# Patient Record
Sex: Male | Born: 1935 | Race: Black or African American | Hispanic: No | Marital: Married | Smoking: Former smoker
Health system: Southern US, Community
[De-identification: ages and names within clinical notes are randomized; demographics above are authoritative.]

## PROBLEM LIST (undated history)

## (undated) DIAGNOSIS — R058 Other specified cough: Secondary | ICD-10-CM

## (undated) DIAGNOSIS — D472 Monoclonal gammopathy: Secondary | ICD-10-CM

## (undated) DIAGNOSIS — H409 Unspecified glaucoma: Secondary | ICD-10-CM

## (undated) DIAGNOSIS — Z9181 History of falling: Secondary | ICD-10-CM

## (undated) DIAGNOSIS — N189 Chronic kidney disease, unspecified: Secondary | ICD-10-CM

## (undated) DIAGNOSIS — C801 Malignant (primary) neoplasm, unspecified: Secondary | ICD-10-CM

## (undated) DIAGNOSIS — K921 Melena: Secondary | ICD-10-CM

## (undated) DIAGNOSIS — D638 Anemia in other chronic diseases classified elsewhere: Secondary | ICD-10-CM

## (undated) DIAGNOSIS — E119 Type 2 diabetes mellitus without complications: Secondary | ICD-10-CM

## (undated) DIAGNOSIS — I70269 Atherosclerosis of native arteries of extremities with gangrene, unspecified extremity: Secondary | ICD-10-CM

## (undated) DIAGNOSIS — F32A Depression, unspecified: Secondary | ICD-10-CM

## (undated) DIAGNOSIS — Z89619 Acquired absence of unspecified leg above knee: Secondary | ICD-10-CM

## (undated) DIAGNOSIS — Z993 Dependence on wheelchair: Secondary | ICD-10-CM

## (undated) DIAGNOSIS — E46 Unspecified protein-calorie malnutrition: Secondary | ICD-10-CM

## (undated) DIAGNOSIS — R011 Cardiac murmur, unspecified: Secondary | ICD-10-CM

## (undated) DIAGNOSIS — I2510 Atherosclerotic heart disease of native coronary artery without angina pectoris: Secondary | ICD-10-CM

## (undated) DIAGNOSIS — I10 Essential (primary) hypertension: Secondary | ICD-10-CM

## (undated) DIAGNOSIS — G709 Myoneural disorder, unspecified: Secondary | ICD-10-CM

## (undated) DIAGNOSIS — N186 End stage renal disease: Secondary | ICD-10-CM

## (undated) DIAGNOSIS — H209 Unspecified iridocyclitis: Secondary | ICD-10-CM

## (undated) DIAGNOSIS — R05 Cough: Secondary | ICD-10-CM

## (undated) DIAGNOSIS — J189 Pneumonia, unspecified organism: Secondary | ICD-10-CM

## (undated) DIAGNOSIS — R159 Full incontinence of feces: Secondary | ICD-10-CM

## (undated) DIAGNOSIS — F329 Major depressive disorder, single episode, unspecified: Secondary | ICD-10-CM

## (undated) DIAGNOSIS — N281 Cyst of kidney, acquired: Secondary | ICD-10-CM

## (undated) DIAGNOSIS — I739 Peripheral vascular disease, unspecified: Secondary | ICD-10-CM

## (undated) DIAGNOSIS — H543 Unqualified visual loss, both eyes: Secondary | ICD-10-CM

## (undated) DIAGNOSIS — I509 Heart failure, unspecified: Secondary | ICD-10-CM

## (undated) DIAGNOSIS — K000 Anodontia: Secondary | ICD-10-CM

## (undated) DIAGNOSIS — K08109 Complete loss of teeth, unspecified cause, unspecified class: Secondary | ICD-10-CM

## (undated) HISTORY — PX: EYE SURGERY: SHX253

## (undated) HISTORY — PX: OTHER SURGICAL HISTORY: SHX169

## (undated) HISTORY — PX: PROSTATECTOMY: SHX69

## (undated) HISTORY — PX: LEG AMPUTATION ABOVE KNEE: SHX117

---

## 1989-03-30 DIAGNOSIS — Z89619 Acquired absence of unspecified leg above knee: Secondary | ICD-10-CM

## 1989-03-30 HISTORY — DX: Acquired absence of unspecified leg above knee: Z89.619

## 2006-01-26 ENCOUNTER — Ambulatory Visit: Payer: Self-pay | Admitting: Internal Medicine

## 2006-02-02 ENCOUNTER — Ambulatory Visit: Payer: Self-pay | Admitting: Internal Medicine

## 2006-02-27 ENCOUNTER — Ambulatory Visit: Payer: Self-pay | Admitting: Internal Medicine

## 2006-03-16 ENCOUNTER — Ambulatory Visit: Payer: Self-pay | Admitting: Gastroenterology

## 2006-03-30 ENCOUNTER — Ambulatory Visit: Payer: Self-pay | Admitting: Internal Medicine

## 2006-06-24 ENCOUNTER — Ambulatory Visit: Payer: Self-pay | Admitting: Internal Medicine

## 2006-06-29 ENCOUNTER — Ambulatory Visit: Payer: Self-pay | Admitting: Internal Medicine

## 2006-07-29 ENCOUNTER — Ambulatory Visit: Payer: Self-pay | Admitting: Internal Medicine

## 2006-08-29 ENCOUNTER — Ambulatory Visit: Payer: Self-pay | Admitting: Internal Medicine

## 2006-09-28 ENCOUNTER — Ambulatory Visit: Payer: Self-pay | Admitting: Internal Medicine

## 2006-10-08 ENCOUNTER — Ambulatory Visit: Payer: Self-pay | Admitting: Internal Medicine

## 2006-10-29 ENCOUNTER — Ambulatory Visit: Payer: Self-pay | Admitting: Internal Medicine

## 2006-12-29 ENCOUNTER — Ambulatory Visit: Payer: Self-pay | Admitting: Internal Medicine

## 2007-01-29 ENCOUNTER — Ambulatory Visit: Payer: Self-pay | Admitting: Internal Medicine

## 2007-02-07 ENCOUNTER — Ambulatory Visit: Payer: Self-pay | Admitting: Internal Medicine

## 2007-02-28 ENCOUNTER — Ambulatory Visit: Payer: Self-pay | Admitting: Internal Medicine

## 2007-05-01 ENCOUNTER — Ambulatory Visit: Payer: Self-pay | Admitting: Internal Medicine

## 2007-05-30 ENCOUNTER — Ambulatory Visit: Payer: Self-pay | Admitting: Internal Medicine

## 2007-06-29 ENCOUNTER — Ambulatory Visit: Payer: Self-pay | Admitting: Internal Medicine

## 2007-08-29 ENCOUNTER — Ambulatory Visit: Payer: Self-pay | Admitting: Internal Medicine

## 2007-09-28 ENCOUNTER — Ambulatory Visit: Payer: Self-pay | Admitting: Internal Medicine

## 2007-10-04 ENCOUNTER — Ambulatory Visit: Payer: Self-pay | Admitting: Internal Medicine

## 2007-10-29 ENCOUNTER — Ambulatory Visit: Payer: Self-pay | Admitting: Internal Medicine

## 2010-06-15 ENCOUNTER — Inpatient Hospital Stay: Payer: Self-pay | Admitting: Internal Medicine

## 2010-09-24 ENCOUNTER — Emergency Department: Payer: Self-pay | Admitting: Emergency Medicine

## 2010-10-02 ENCOUNTER — Ambulatory Visit: Payer: Self-pay | Admitting: Internal Medicine

## 2010-10-03 ENCOUNTER — Ambulatory Visit: Payer: Self-pay | Admitting: Internal Medicine

## 2010-10-29 ENCOUNTER — Ambulatory Visit: Payer: Self-pay | Admitting: Internal Medicine

## 2010-12-03 ENCOUNTER — Ambulatory Visit: Payer: Self-pay | Admitting: Internal Medicine

## 2010-12-29 ENCOUNTER — Ambulatory Visit: Payer: Self-pay | Admitting: Internal Medicine

## 2011-01-29 ENCOUNTER — Ambulatory Visit: Payer: Self-pay | Admitting: Internal Medicine

## 2011-02-02 ENCOUNTER — Observation Stay: Payer: Self-pay | Admitting: Internal Medicine

## 2011-02-28 ENCOUNTER — Ambulatory Visit: Payer: Self-pay | Admitting: Internal Medicine

## 2011-03-31 ENCOUNTER — Ambulatory Visit: Payer: Self-pay | Admitting: Internal Medicine

## 2011-04-22 LAB — URIC ACID: Uric Acid: 5.7 mg/dL (ref 3.5–7.2)

## 2011-04-22 LAB — CREATININE, SERUM: Creatinine: 3.57 mg/dL — ABNORMAL HIGH (ref 0.60–1.30)

## 2011-04-22 LAB — CANCER CENTER HEMOGLOBIN: HGB: 10.4 g/dL — ABNORMAL LOW (ref 13.0–18.0)

## 2011-04-22 LAB — CALCIUM: Calcium, Total: 8.6 mg/dL (ref 8.5–10.1)

## 2011-05-01 ENCOUNTER — Ambulatory Visit: Payer: Self-pay | Admitting: Internal Medicine

## 2011-05-20 LAB — CBC CANCER CENTER
Basophil %: 0 %
Eosinophil #: 0 x10 3/mm (ref 0.0–0.7)
HCT: 30.9 % — ABNORMAL LOW (ref 40.0–52.0)
Lymphocyte %: 16.1 %
MCH: 22.2 pg — ABNORMAL LOW (ref 26.0–34.0)
MCHC: 32.3 g/dL (ref 32.0–36.0)
MCV: 69 fL — ABNORMAL LOW (ref 80–100)
Neutrophil #: 4.4 x10 3/mm (ref 1.4–6.5)
RBC: 4.51 10*6/uL (ref 4.40–5.90)
WBC: 5.7 x10 3/mm (ref 3.8–10.6)

## 2011-05-20 LAB — CREATININE, SERUM: EGFR (African American): 19 — ABNORMAL LOW

## 2011-05-20 LAB — CALCIUM: Calcium, Total: 8.2 mg/dL — ABNORMAL LOW (ref 8.5–10.1)

## 2011-05-21 LAB — PROT IMMUNOELECTROPHORES(ARMC)

## 2011-05-21 LAB — KAPPA/LAMBDA FREE LIGHT CHAINS (ARMC)

## 2011-05-29 ENCOUNTER — Ambulatory Visit: Payer: Self-pay | Admitting: Internal Medicine

## 2012-07-28 ENCOUNTER — Other Ambulatory Visit: Payer: Self-pay | Admitting: Internal Medicine

## 2012-07-28 LAB — POTASSIUM: Potassium: 5.6 mmol/L — ABNORMAL HIGH (ref 3.5–5.1)

## 2012-07-28 LAB — IRON AND TIBC
Iron Saturation: 13 %
Iron: 42 ug/dL — ABNORMAL LOW (ref 65–175)

## 2012-08-10 ENCOUNTER — Ambulatory Visit: Payer: Self-pay | Admitting: Vascular Surgery

## 2012-08-10 LAB — CBC
HGB: 10 g/dL — ABNORMAL LOW (ref 13.0–18.0)
MCHC: 32 g/dL (ref 32.0–36.0)
RDW: 16.6 % — ABNORMAL HIGH (ref 11.5–14.5)
WBC: 5.2 10*3/uL (ref 3.8–10.6)

## 2012-08-10 LAB — BASIC METABOLIC PANEL
BUN: 72 mg/dL — ABNORMAL HIGH (ref 7–18)
Calcium, Total: 8.3 mg/dL — ABNORMAL LOW (ref 8.5–10.1)
Chloride: 107 mmol/L (ref 98–107)
Co2: 24 mmol/L (ref 21–32)
Creatinine: 4.64 mg/dL — ABNORMAL HIGH (ref 0.60–1.30)
EGFR (Non-African Amer.): 11 — ABNORMAL LOW
Glucose: 199 mg/dL — ABNORMAL HIGH (ref 65–99)
Osmolality: 299 (ref 275–301)

## 2012-08-17 ENCOUNTER — Ambulatory Visit: Payer: Self-pay | Admitting: Vascular Surgery

## 2012-12-27 ENCOUNTER — Emergency Department: Payer: Self-pay

## 2012-12-27 LAB — COMPREHENSIVE METABOLIC PANEL
Albumin: 3.5 g/dL (ref 3.4–5.0)
Alkaline Phosphatase: 119 U/L (ref 50–136)
Anion Gap: 9 (ref 7–16)
Bilirubin,Total: 0.3 mg/dL (ref 0.2–1.0)
Calcium, Total: 8 mg/dL — ABNORMAL LOW (ref 8.5–10.1)
Chloride: 107 mmol/L (ref 98–107)
Glucose: 237 mg/dL — ABNORMAL HIGH (ref 65–99)
Osmolality: 294 (ref 275–301)
Potassium: 5.2 mmol/L — ABNORMAL HIGH (ref 3.5–5.1)
SGPT (ALT): 23 U/L (ref 12–78)

## 2012-12-27 LAB — CBC
HCT: 33.2 % — ABNORMAL LOW (ref 40.0–52.0)
MCH: 21.1 pg — ABNORMAL LOW (ref 26.0–34.0)
MCHC: 31.7 g/dL — ABNORMAL LOW (ref 32.0–36.0)
MCV: 67 fL — ABNORMAL LOW (ref 80–100)
RDW: 18 % — ABNORMAL HIGH (ref 11.5–14.5)
WBC: 5.8 10*3/uL (ref 3.8–10.6)

## 2013-02-01 ENCOUNTER — Ambulatory Visit: Payer: Self-pay | Admitting: Vascular Surgery

## 2013-02-01 LAB — BASIC METABOLIC PANEL
Anion Gap: 4 — ABNORMAL LOW (ref 7–16)
Calcium, Total: 8.8 mg/dL (ref 8.5–10.1)
Co2: 31 mmol/L (ref 21–32)
Creatinine: 3.84 mg/dL — ABNORMAL HIGH (ref 0.60–1.30)
EGFR (African American): 17 — ABNORMAL LOW
Glucose: 188 mg/dL — ABNORMAL HIGH (ref 65–99)
Osmolality: 274 (ref 275–301)
Sodium: 134 mmol/L — ABNORMAL LOW (ref 136–145)

## 2013-02-01 LAB — CBC
HCT: 31.2 % — ABNORMAL LOW (ref 40.0–52.0)
HGB: 9.6 g/dL — ABNORMAL LOW (ref 13.0–18.0)
MCH: 21.4 pg — ABNORMAL LOW (ref 26.0–34.0)
RBC: 4.47 10*6/uL (ref 4.40–5.90)

## 2013-02-08 ENCOUNTER — Ambulatory Visit: Payer: Self-pay | Admitting: Vascular Surgery

## 2013-05-03 ENCOUNTER — Ambulatory Visit: Payer: Self-pay | Admitting: Internal Medicine

## 2013-05-08 ENCOUNTER — Ambulatory Visit: Payer: Self-pay | Admitting: Physician Assistant

## 2013-05-22 LAB — CK TOTAL AND CKMB (NOT AT ARMC)
CK, Total: 91 U/L
CK-MB: 0.7 ng/mL (ref 0.5–3.6)

## 2013-05-22 LAB — PROTIME-INR
INR: 1.2
Prothrombin Time: 15.3 secs — ABNORMAL HIGH (ref 11.5–14.7)

## 2013-05-22 LAB — COMPREHENSIVE METABOLIC PANEL
ANION GAP: 8 (ref 7–16)
AST: 57 U/L — AB (ref 15–37)
Albumin: 2.5 g/dL — ABNORMAL LOW (ref 3.4–5.0)
Alkaline Phosphatase: 110 U/L
BUN: 59 mg/dL — AB (ref 7–18)
Bilirubin,Total: 0.4 mg/dL (ref 0.2–1.0)
CHLORIDE: 99 mmol/L (ref 98–107)
CREATININE: 6.97 mg/dL — AB (ref 0.60–1.30)
Calcium, Total: 8.5 mg/dL (ref 8.5–10.1)
Co2: 29 mmol/L (ref 21–32)
EGFR (African American): 8 — ABNORMAL LOW
EGFR (Non-African Amer.): 7 — ABNORMAL LOW
GLUCOSE: 240 mg/dL — AB (ref 65–99)
OSMOLALITY: 296 (ref 275–301)
POTASSIUM: 4.8 mmol/L (ref 3.5–5.1)
SGPT (ALT): 64 U/L (ref 12–78)
SODIUM: 136 mmol/L (ref 136–145)
Total Protein: 7.7 g/dL (ref 6.4–8.2)

## 2013-05-22 LAB — URINALYSIS, COMPLETE
BILIRUBIN, UR: NEGATIVE
Blood: NEGATIVE
Ketone: NEGATIVE
Leukocyte Esterase: NEGATIVE
Nitrite: NEGATIVE
Ph: 7 (ref 4.5–8.0)
SPECIFIC GRAVITY: 1.012 (ref 1.003–1.030)

## 2013-05-22 LAB — CBC
HCT: 34.2 % — ABNORMAL LOW (ref 40.0–52.0)
HGB: 10.7 g/dL — AB (ref 13.0–18.0)
MCH: 21.2 pg — ABNORMAL LOW (ref 26.0–34.0)
MCHC: 31.3 g/dL — ABNORMAL LOW (ref 32.0–36.0)
MCV: 68 fL — AB (ref 80–100)
Platelet: 382 10*3/uL (ref 150–440)
RBC: 5.03 10*6/uL (ref 4.40–5.90)
RDW: 18.2 % — ABNORMAL HIGH (ref 11.5–14.5)
WBC: 8.5 10*3/uL (ref 3.8–10.6)

## 2013-05-22 LAB — TROPONIN I
Troponin-I: 0.07 ng/mL — ABNORMAL HIGH
Troponin-I: 0.08 ng/mL — ABNORMAL HIGH

## 2013-05-22 LAB — APTT: Activated PTT: 37.7 secs — ABNORMAL HIGH (ref 23.6–35.9)

## 2013-05-22 LAB — CK-MB: CK-MB: 1 ng/mL (ref 0.5–3.6)

## 2013-05-23 LAB — BASIC METABOLIC PANEL
Anion Gap: 9 (ref 7–16)
BUN: 64 mg/dL — ABNORMAL HIGH (ref 7–18)
CALCIUM: 8.8 mg/dL (ref 8.5–10.1)
Chloride: 98 mmol/L (ref 98–107)
Co2: 30 mmol/L (ref 21–32)
Creatinine: 7.54 mg/dL — ABNORMAL HIGH (ref 0.60–1.30)
GFR CALC AF AMER: 7 — AB
GFR CALC NON AF AMER: 6 — AB
GLUCOSE: 56 mg/dL — AB (ref 65–99)
Osmolality: 290 (ref 275–301)
POTASSIUM: 5.1 mmol/L (ref 3.5–5.1)
Sodium: 137 mmol/L (ref 136–145)

## 2013-05-23 LAB — PHOSPHORUS: PHOSPHORUS: 7.6 mg/dL — AB (ref 2.5–4.9)

## 2013-05-23 LAB — TROPONIN I: Troponin-I: 0.07 ng/mL — ABNORMAL HIGH

## 2013-05-23 LAB — CK-MB: CK-MB: 1.1 ng/mL (ref 0.5–3.6)

## 2013-05-23 LAB — HEMOGLOBIN: HGB: 9.8 g/dL — ABNORMAL LOW (ref 13.0–18.0)

## 2013-05-24 ENCOUNTER — Inpatient Hospital Stay: Payer: Self-pay | Admitting: Internal Medicine

## 2013-05-25 LAB — CLOSTRIDIUM DIFFICILE(ARMC)

## 2013-05-25 LAB — URINE CULTURE

## 2013-05-25 LAB — PHOSPHORUS: Phosphorus: 4.2 mg/dL (ref 2.5–4.9)

## 2013-05-26 LAB — PLATELET COUNT: Platelet: 331 10*3/uL (ref 150–440)

## 2013-06-01 ENCOUNTER — Emergency Department: Payer: Self-pay | Admitting: Emergency Medicine

## 2013-06-01 LAB — COMPREHENSIVE METABOLIC PANEL
ALT: 53 U/L (ref 12–78)
AST: 45 U/L — AB (ref 15–37)
Albumin: 2.5 g/dL — ABNORMAL LOW (ref 3.4–5.0)
Alkaline Phosphatase: 143 U/L — ABNORMAL HIGH
Anion Gap: 5 — ABNORMAL LOW (ref 7–16)
BILIRUBIN TOTAL: 0.3 mg/dL (ref 0.2–1.0)
BUN: 56 mg/dL — AB (ref 7–18)
CALCIUM: 8.8 mg/dL (ref 8.5–10.1)
CHLORIDE: 101 mmol/L (ref 98–107)
Co2: 29 mmol/L (ref 21–32)
Creatinine: 6.57 mg/dL — ABNORMAL HIGH (ref 0.60–1.30)
EGFR (Non-African Amer.): 7 — ABNORMAL LOW
GFR CALC AF AMER: 9 — AB
Glucose: 68 mg/dL (ref 65–99)
OSMOLALITY: 284 (ref 275–301)
Potassium: 4.6 mmol/L (ref 3.5–5.1)
Sodium: 135 mmol/L — ABNORMAL LOW (ref 136–145)
Total Protein: 8.2 g/dL (ref 6.4–8.2)

## 2013-06-01 LAB — CBC
HCT: 32.4 % — ABNORMAL LOW (ref 40.0–52.0)
HGB: 10.2 g/dL — ABNORMAL LOW (ref 13.0–18.0)
MCH: 21.3 pg — ABNORMAL LOW (ref 26.0–34.0)
MCHC: 31.5 g/dL — ABNORMAL LOW (ref 32.0–36.0)
MCV: 68 fL — AB (ref 80–100)
PLATELETS: 490 10*3/uL — AB (ref 150–440)
RBC: 4.79 10*6/uL (ref 4.40–5.90)
RDW: 19.1 % — AB (ref 11.5–14.5)
WBC: 7.1 10*3/uL (ref 3.8–10.6)

## 2013-06-02 ENCOUNTER — Ambulatory Visit: Payer: Self-pay | Admitting: Vascular Surgery

## 2013-09-27 ENCOUNTER — Ambulatory Visit: Payer: Self-pay | Admitting: Vascular Surgery

## 2013-09-27 LAB — BASIC METABOLIC PANEL
ANION GAP: 11 (ref 7–16)
BUN: 35 mg/dL — ABNORMAL HIGH (ref 7–18)
CHLORIDE: 98 mmol/L (ref 98–107)
Calcium, Total: 8.3 mg/dL — ABNORMAL LOW (ref 8.5–10.1)
Co2: 24 mmol/L (ref 21–32)
Creatinine: 5.54 mg/dL — ABNORMAL HIGH (ref 0.60–1.30)
GFR CALC AF AMER: 11 — AB
GFR CALC NON AF AMER: 9 — AB
GLUCOSE: 158 mg/dL — AB (ref 65–99)
OSMOLALITY: 278 (ref 275–301)
Potassium: 4.9 mmol/L (ref 3.5–5.1)
SODIUM: 133 mmol/L — AB (ref 136–145)

## 2013-09-27 LAB — CBC
HCT: 36.9 % — ABNORMAL LOW (ref 40.0–52.0)
HGB: 11.5 g/dL — ABNORMAL LOW (ref 13.0–18.0)
MCH: 21.8 pg — ABNORMAL LOW (ref 26.0–34.0)
MCHC: 31 g/dL — ABNORMAL LOW (ref 32.0–36.0)
MCV: 70 fL — ABNORMAL LOW (ref 80–100)
PLATELETS: 209 10*3/uL (ref 150–440)
RBC: 5.25 10*6/uL (ref 4.40–5.90)
RDW: 18 % — AB (ref 11.5–14.5)
WBC: 5.2 10*3/uL (ref 3.8–10.6)

## 2013-10-04 ENCOUNTER — Ambulatory Visit: Payer: Self-pay | Admitting: Vascular Surgery

## 2013-11-17 ENCOUNTER — Inpatient Hospital Stay: Payer: Self-pay | Admitting: Internal Medicine

## 2013-11-17 LAB — CBC WITH DIFFERENTIAL/PLATELET
BASOS ABS: 0 10*3/uL (ref 0.0–0.1)
BASOS PCT: 0.3 %
Eosinophil #: 0.1 10*3/uL (ref 0.0–0.7)
Eosinophil %: 1.2 %
HCT: 37 % — ABNORMAL LOW (ref 40.0–52.0)
HGB: 11.1 g/dL — ABNORMAL LOW (ref 13.0–18.0)
LYMPHS PCT: 4.3 %
Lymphocyte #: 0.4 10*3/uL — ABNORMAL LOW (ref 1.0–3.6)
MCH: 22.3 pg — ABNORMAL LOW (ref 26.0–34.0)
MCHC: 29.9 g/dL — ABNORMAL LOW (ref 32.0–36.0)
MCV: 75 fL — ABNORMAL LOW (ref 80–100)
MONO ABS: 0.2 x10 3/mm (ref 0.2–1.0)
Monocyte %: 1.9 %
NEUTROS ABS: 8.6 10*3/uL — AB (ref 1.4–6.5)
Neutrophil %: 92.3 %
Platelet: 266 10*3/uL (ref 150–440)
RBC: 4.96 10*6/uL (ref 4.40–5.90)
RDW: 18.2 % — ABNORMAL HIGH (ref 11.5–14.5)
WBC: 9.4 10*3/uL (ref 3.8–10.6)

## 2013-11-17 LAB — PHOSPHORUS: Phosphorus: 3.6 mg/dL (ref 2.5–4.9)

## 2013-11-17 LAB — COMPREHENSIVE METABOLIC PANEL
ALK PHOS: 90 U/L
ALT: 31 U/L
Albumin: 3 g/dL — ABNORMAL LOW (ref 3.4–5.0)
Anion Gap: 12 (ref 7–16)
BUN: 29 mg/dL — AB (ref 7–18)
Bilirubin,Total: 0.4 mg/dL (ref 0.2–1.0)
CALCIUM: 7.3 mg/dL — AB (ref 8.5–10.1)
CHLORIDE: 102 mmol/L (ref 98–107)
CO2: 26 mmol/L (ref 21–32)
Creatinine: 5.17 mg/dL — ABNORMAL HIGH (ref 0.60–1.30)
GFR CALC AF AMER: 12 — AB
GFR CALC NON AF AMER: 10 — AB
GLUCOSE: 117 mg/dL — AB (ref 65–99)
OSMOLALITY: 286 (ref 275–301)
POTASSIUM: 4.5 mmol/L (ref 3.5–5.1)
SGOT(AST): 49 U/L — ABNORMAL HIGH (ref 15–37)
Sodium: 140 mmol/L (ref 136–145)
Total Protein: 7.2 g/dL (ref 6.4–8.2)

## 2013-11-17 LAB — MAGNESIUM: MAGNESIUM: 1.3 mg/dL — AB

## 2013-11-17 LAB — PROTIME-INR
INR: 1.2
Prothrombin Time: 14.6 secs (ref 11.5–14.7)

## 2013-11-17 LAB — TROPONIN I: Troponin-I: 0.04 ng/mL

## 2013-11-18 LAB — CBC WITH DIFFERENTIAL/PLATELET
BASOS ABS: 0.1 10*3/uL (ref 0.0–0.1)
BASOS PCT: 0.5 %
EOS ABS: 0.3 10*3/uL (ref 0.0–0.7)
EOS PCT: 2.9 %
HCT: 31.6 % — AB (ref 40.0–52.0)
HGB: 9.7 g/dL — AB (ref 13.0–18.0)
Lymphocyte #: 1.1 10*3/uL (ref 1.0–3.6)
Lymphocyte %: 9.3 %
MCH: 22.9 pg — ABNORMAL LOW (ref 26.0–34.0)
MCHC: 30.7 g/dL — AB (ref 32.0–36.0)
MCV: 75 fL — ABNORMAL LOW (ref 80–100)
MONOS PCT: 8.4 %
Monocyte #: 1 x10 3/mm (ref 0.2–1.0)
NEUTROS ABS: 9 10*3/uL — AB (ref 1.4–6.5)
Neutrophil %: 78.9 %
Platelet: 235 10*3/uL (ref 150–440)
RBC: 4.23 10*6/uL — ABNORMAL LOW (ref 4.40–5.90)
RDW: 18.4 % — AB (ref 11.5–14.5)
WBC: 11.4 10*3/uL — ABNORMAL HIGH (ref 3.8–10.6)

## 2013-11-18 LAB — URINALYSIS, COMPLETE
BACTERIA: NONE SEEN
Bilirubin,UR: NEGATIVE
Glucose,UR: 150 mg/dL (ref 0–75)
KETONE: NEGATIVE
Leukocyte Esterase: NEGATIVE
Nitrite: NEGATIVE
PH: 7 (ref 4.5–8.0)
Protein: 100
Specific Gravity: 1.01 (ref 1.003–1.030)
Squamous Epithelial: 1
WBC UR: 10 /HPF (ref 0–5)

## 2013-11-18 LAB — BASIC METABOLIC PANEL
Anion Gap: 12 (ref 7–16)
BUN: 46 mg/dL — AB (ref 7–18)
CO2: 25 mmol/L (ref 21–32)
Calcium, Total: 6.5 mg/dL — CL (ref 8.5–10.1)
Chloride: 103 mmol/L (ref 98–107)
Creatinine: 6.64 mg/dL — ABNORMAL HIGH (ref 0.60–1.30)
EGFR (Non-African Amer.): 7 — ABNORMAL LOW
GFR CALC AF AMER: 9 — AB
Glucose: 137 mg/dL — ABNORMAL HIGH (ref 65–99)
Osmolality: 293 (ref 275–301)
Potassium: 3.8 mmol/L (ref 3.5–5.1)
Sodium: 140 mmol/L (ref 136–145)

## 2013-11-20 LAB — RENAL FUNCTION PANEL
Albumin: 2.5 g/dL — ABNORMAL LOW (ref 3.4–5.0)
Anion Gap: 7 (ref 7–16)
BUN: 44 mg/dL — AB (ref 7–18)
CALCIUM: 7.2 mg/dL — AB (ref 8.5–10.1)
CO2: 27 mmol/L (ref 21–32)
Chloride: 103 mmol/L (ref 98–107)
Creatinine: 6.71 mg/dL — ABNORMAL HIGH (ref 0.60–1.30)
EGFR (Non-African Amer.): 7 — ABNORMAL LOW
GFR CALC AF AMER: 8 — AB
Glucose: 185 mg/dL — ABNORMAL HIGH (ref 65–99)
Osmolality: 290 (ref 275–301)
Phosphorus: 4.5 mg/dL (ref 2.5–4.9)
Potassium: 3.7 mmol/L (ref 3.5–5.1)
SODIUM: 137 mmol/L (ref 136–145)

## 2013-11-20 LAB — URINE CULTURE

## 2013-11-20 LAB — AEROBIC CULTURE

## 2013-11-22 LAB — CULTURE, BLOOD (SINGLE)

## 2014-07-20 NOTE — Op Note (Signed)
PATIENT NAMEZACKERIE, [name] MR#:  [id number] DATE OF BIRTH:  Aug 14, 1934  DATE OF PROCEDURE:  08/17/2012  PREOPERATIVE DIAGNOSES: 1. Chronic kidney disease nearing dialysis dependence.  2. Hypertension.  3. Diabetes.   POSTOPERATIVE DIAGNOSIS: 1. Chronic kidney disease nearing dialysis dependence. 2. Hypertension. 3. Diabetes.  PROCEDURES:  Left brachial basilic arteriovenous fistula creation.   SURGEON: Leotis Pain, M.D.   ANESTHESIA: General.   ESTIMATED BLOOD LOSS: Approximately 25 mL.  INDICATION FOR PROCEUDRE:  A 80 year old [place] American male with chronic kidney disease nearing dialysis dependence. A fistula is planned for permanent dialysis access.  Preoperative vein mapping suggests adequate cephalic vein on the left and brachiocephalic AV fistula was originally planned.   DESCRIPTION OF PROCEDURE: The patient is brought to the operative suite, and after an adequate level of general anesthesia obtained, the left upper extremity was sterilely prepped and draped and a sterile surgical field was created. A curvilinear incision created at the antecubital fossa and the cephalic vein was dissected out. This was a less than 1 mm vein that appeared  small and sclerotic and not usable for fistula creation. I explored the vein, I ligated it distally, tried to pass [facility] dilators and not pass a 1.5 mm dilator in the cephalic vein. This was clearly not going to be usable for a fistula and so I abandoned this endeavor. I then explored the basilic vein; this was also a small vessel. This was slightly larger and did appear as if it could be potentially used for fistula creation. I was able to pass up to a 2 mm Donna Christen dilator. A 2.5 Garrett dilator did not pass easily, but there was some return of blood at this point and the vein was marked for orientation.  It had been ligated distally. The artery was encircled with vessel loops. The patient was systemically heparinized.  The anterior wall  arteriotomy was created with an 11 blade and extended with Potts scissors. I then created an anastomosis after cutting and beveling the vein to an appropriate length to match the arteriotomy and the vessel was flushed and de-aired prior to releasing control. A single 6-0 Prolene patch was used for hemostasis. There was flow within the AV fistula with conclusion of the procedure, although the vein was small and it was not clear if this would mature to be usable.  As he was not yet on dialysis, graft creation was not felt to be an option at this point. I felt it was worth trying to get a fistula and even if it needs secondary balloon-assisted maturation.  The wound was irrigated.  Surgicel and Evicel hemostatic agents were placed when the procedure was complete. The wound was then closed with a running 3-0 Vicryl and 4-0 Monocryl. Dermabond was placed as a dressing.  The patient tolerated the procedure well and was taken to the recovery room in stable condition    ____________________________ Algernon Huxley, MD jsd:rw D: 08/17/2012 17:19:28 ET T: 08/17/2012 19:43:34 ET JOB#: [id number]  cc: Algernon Huxley, MD, <Dictator> Algernon Huxley MD ELECTRONICALLY SIGNED 08/24/2012 13:51

## 2014-07-20 NOTE — Op Note (Signed)
PATIENT NAMEKENYAN, [name] MR#:  [id number] DATE OF BIRTH:  [birth year]  DATE OF PROCEDURE:  02/08/2013  PREOPERATIVE DIAGNOSES:  1.  End-stage renal disease.  2.  Hypertension.   POSTOPERATIVE DIAGNOSES:  1.  End-stage renal disease.  2.  Hypertension.   PROCEDURE: Right brachiobasilic AV fistula creation.   SURGEON: Algernon Huxley, M.D.   ANESTHESIA: General.   ESTIMATED BLOOD LOSS: Minimal.   INDICATION FOR PROCEDURE: A 80 year old African American male with end-stage renal disease. He has had previous attempt to left arm AV fistula which failed. He is brought back for permanent dialysis access to the right arm. Vein mapping had showed an adequate vein on the right; however, at his last surgical exploration on the left, his veins were small. It was discussed that if he did not have an adequate superficial vein, a graft would be placed, but if an adequate vein was seen, a fistula would be created. Risks and benefits were discussed. Informed consent was obtained.   DESCRIPTION OF PROCEDURE: Right upper extremity was sterilely prepped and draped and a sterile surgical field was created. Incision was created at the antecubital fossa, and a nice patent basilic vein was easily identified. The cephalic vein was small and sclerotic. This was dissected out and felt to be a good usable vein over several centimeters. The brachial artery was then dissected out and encircled with vessel loops proximally and distally. The vein was ligated distally and marked for orientation. The patient was systemically heparinized. It was locally flushed with heparin. We were able to pass up to a 4 dilator with no difficulty at all in the basilic vein, and with this, I felt this would be a good option for fistula creation.   Control was then pulled up on the vessel loops. An anterior wall arteriotomy was created with an 11-blade and extended with Potts scissors. The vein was cut and beveled to an appropriate length to match  the arteriotomy, and an anastomosis was created with a running 6-0 Prolene suture in the usual fashion. The vessel was flushed and de-aired prior to release of control. A single 6-0 Prolene suture was used for a patch suture for hemostasis. Following this, Surgicel was placed. There was an excellent thrill within the AV fistula and a palpable pulse in the brachial artery distally.   At this point, I elected to terminate the procedure. The wound was closed with 3-0 Vicryl and 4-0 Monocryl. Dermabond was placed as a dressing. The patient tolerated the procedure well and was taken to the recovery room in stable condition   ____________________________ Algernon Huxley, MD jsd:np D: 02/08/2013 15:41:23 ET T: 02/08/2013 19:40:44 ET JOB#: [id number]  cc: Algernon Huxley, MD, <Dictator> Algernon Huxley MD ELECTRONICALLY SIGNED 02/09/2013 14:47

## 2014-07-21 NOTE — Consult Note (Signed)
Present Illness This is a 80 year old male who presents to the hospital due to rigors and chills from dialysis. The patient apparently was only 45 minutes into his dialysis when he started to have significant chills and rigors. He was noted to have a fever of 103. He was sent over to the ER for further evaluation. In the Emergency Room, the patient did have a fever. He was also tachycardic.  The patient was empirically given IV antibiotics and hospitalist services were contacted for further treatment and evaluation. The patient denies any chest pain, any shortness of breath, any nausea, vomiting, cough, abdominal pain, diarrhea, or any other associated symptoms presently.  He was dialyzed today via his arm access  PAST MEDICAL HISTORY: Consistent with diabetes, peripheral vascular disease, hypertension, end-stage renal disease on hemodialysis, Monday, Wednesday, Friday.   Home Medications: Medication Instructions Status  Rena-Vite Vitamin B Complex with C and Folic Acid oral tablet 1 tab(s) orally once a day Active  metoprolol tartrate 25 mg oral tablet 1 tab(s) orally 2 times a day Active  aspirin 81 mg oral tablet 1 tab(s) orally once a day Active  acetaminophen-HYDROcodone 325 mg-5 mg oral tablet 1 tab(s) orally every 4 hours, As Needed - for Pain Active  NovoLOG 100 units/mL subcutaneous solution  subcutaneous 3 times a day (before meals), As Needed per sliding scale Active  acetaminophen 500 mg oral tablet 1 tab(s) orally every 6 hours, As Needed - for Pain Active  ciprofloxacin 500 mg oral tablet 1 tab(s) orally every 12 hours Active  gabapentin 300 mg oral capsule 1 cap(s) orally 3 times a day Active    No Known Allergies:   Case History:  Family History Non-Contributory   Social History negative tobacco, negative ETOH, negative Illicit drugs   Review of Systems:  Fever/Chills Yes   Cough No   Sputum No   Abdominal Pain No   Diarrhea No   Constipation No    Nausea/Vomiting No   SOB/DOE No   Chest Pain No   Telemetry Reviewed NSR   Dysuria No   Physical Exam:  GEN well developed, well nourished, no acute distress   HEENT hearing intact to voice, moist oral mucosa   NECK supple  trachea midline   RESP normal resp effort  no use of accessory muscles   CARD regular rate  no JVD   VASCULAR ACCESS Dialysis catheter present  -- Purulent drainage  nontender no drainage   ABD denies tenderness  soft   EXTR negative cyanosis/clubbing, negative edema   SKIN normal to palpation, No ulcers   NEURO follows commands, motor/sensory function intact   PSYCH alert, A+O to time, place, person   Nursing/Ancillary Notes: **Vital Signs.:   22-Aug-15 08:00  Vital Signs Type Q 8hr  Temperature Temperature (F) 98.5  Celsius 36.9  Temperature Source oral  Pulse Pulse 75  Respirations Respirations 18  Systolic BP Systolic BP 341  Diastolic BP (mmHg) Diastolic BP (mmHg) 60  Mean BP 92  Pulse Ox % Pulse Ox % 96  Pulse Ox Activity Level  At rest  Oxygen Delivery Room Air/ 21 %   Routine Chem:  22-Aug-15 05:36   Glucose, Serum  137  BUN  46  Creatinine (comp)  6.64  Sodium, Serum 140  Potassium, Serum 3.8  Chloride, Serum 103  CO2, Serum 25  Calcium (Total), Serum  6.5  Anion Gap 12  Osmolality (calc) 293  eGFR (African American)  9  eGFR (Non-African American)  7 (eGFR values <39m/min/1.73 m2 may be an indication of chronic kidney disease (CKD). Calculated eGFR is useful in patients with stable renal function. The eGFR calculation will not be reliable in acutely ill patients when serum creatinine is changing rapidly. It is not useful in  patients on dialysis. The eGFR calculation may not be applicable to patients at the low and high extremes of body sizes, pregnant women, and vegetarians.)  Result Comment CALCIUM - RESULTS VERIFIED BY REPEAT TESTING.  - NOTIFIED OF CRITICAL VALUE  - C/KIERRA TBlair HeysAT [id number] 11/18/13 [facility]  -  READ-BACK PROCESS PERFORMED.  Result(s) reported on 18 Nov 2013 at 06:17AM.  Routine Hem:  22-Aug-15 05:36   WBC (CBC)  11.4  RBC (CBC)  4.23  Hemoglobin (CBC)  9.7  Hematocrit (CBC)  31.6  Platelet Count (CBC) 235  MCV  75  MCH  22.9  MCHC  30.7  RDW  18.4  Neutrophil % 78.9  Lymphocyte % 9.3  Monocyte % 8.4  Eosinophil % 2.9  Basophil % 0.5  Neutrophil #  9.0  Lymphocyte # 1.1  Monocyte # 1.0  Eosinophil # 0.3  Basophil # 0.1 (Result(s) reported on 18 Nov 2013 at 06:17AM.)    Impression 1.  Sepsis.        IV vancomycin and Zosyn       blood cultures +       ID following and requet the catheter be removed which I agree  2.  Altered mental status.        much improved consistent with metabolic       continue to monitor.  3.  Diabetes.         Continue with his sliding-scale insulin.  4.  Hypertension.         Continue metoprolol.   Electronic Signatures: SHortencia Pilar(MD)  (Signed 22-Aug-15 15:37)  Authored: General Aspect/Present Illness, Home Medications, Allergies, History and Physical Exam, Vital Signs, Labs, Impression/Plan   Last Updated: 22-Aug-15 15:37 by SHortencia Pilar(MD)

## 2014-07-21 NOTE — Op Note (Signed)
PATIENT NAMEAMOUS, [name] MR#:  [id number] DATE OF BIRTH:  [birth year]  DATE OF PROCEDURE:  06/02/2013  PREOPERATIVE DIAGNOSES: 1.  End-stage renal disease, requiring hemodialysis.  2.  Complication of arteriovenous dialysis device, with nonfunction of right arm access.   POSTOPERATIVE DIAGNOSES:  1.  End-stage renal disease, requiring hemodialysis.  2.  Complication of arteriovenous dialysis device, with nonfunction of right arm access.   PROCEDURE PERFORMED: Insertion of right internal jugular cuffed tunneled dialysis catheter with ultrasound and fluoroscopic guidance.   PROCEDURE PERFORMED BY:  Hortencia Pilar, M.D.    SEDATION: Versed 4 mg plus fentanyl 100 mcg administered IV. Continuous ECG, pulse oximetry, and cardiopulmonary monitoring is performed throughout the entire procedure by the interventional radiology nurse. Total sedation time was 45 minutes.   ACCESS: Right IJ.   FLUOROSCOPY TIME: Less than one minute.   CONTRAST USED: None.   INDICATIONS: [name] is a 80 year old gentleman who presented to dialysis and was found to have nonfunction of his right arm access. It recently was created at the dialysis center, but this did not provide a long-term solution, and he is undergoing placement of a catheter. The risks and benefits were reviewed. All questions answered. The patient agrees to proceed.   DESCRIPTION OF PROCEDURE: The patient is taken to special procedures and placed in  supine position. After adequate sedation is achieved, his right neck and chest wall are prepped and draped in sterile fashion. Ultrasound is placed in a sterile sleeve. Ultrasound is utilized secondary to lack of appropriate landmarks and to avoid vascular injury. Jugular vein is identified. It is somewhat smaller than typically seen and the walls are thickened. However, it is still homogeneous, echolucent and compressible, indicating patency. Image is recorded for the permanent record, and a  micropuncture needle is utilized to access the jugular vein. Microwire is advanced under fluoroscopic guidance, and courses down through the atrium and into the inferior vena cava. Having established adequate patency of the vein for placement of a catheter, micro sheath is advanced over the wire. A small counterincision is made at the wire insertion site and the J-wire from a 19 cm tip to cuff Cannon catheter is advanced under fluoroscopy down into the inferior vena cava. Dilator is passed over the wire, and subsequently the dilator and peel-away sheath. Wire and dilator are removed, and the 19 cm tip to cuff Cannon catheter is advanced through the peel-away sheath. The peel-away sheath is removed. The catheter is positioned with its tips at the atriocaval junction and approximated to the chest wall. Exit site is selected, and 1% lidocaine is infiltrated in the soft tissues. An incision is created at the exit site, and the tunneling device is passed subcutaneously. The catheter is then pulled through the tunnel, transected, and the hub assembly connected. This was done after verifying tip position under fluoroscopy. Both lumens aspirate easily and flush well, and are packed with 5000 units of heparin. The catheter is secured to the skin of the chest wall with 0 silk and 4-0 Monocryl subcuticular, and then Dermabond was used to close the neck counterincision. Sterile dressing is applied. The patient tolerated the procedure well, and there were no immediate complications.    ____________________________ Katha Cabal, MD ggs:mr D: 06/02/2013 15:31:33 ET T: 06/02/2013 22:34:25 ET JOB#: [id number]  cc: Katha Cabal, MD, <Dictator> Katha Cabal MD ELECTRONICALLY SIGNED 06/20/2013 18:45

## 2014-07-21 NOTE — Op Note (Signed)
PATIENT NAMETREMAIN, [name] MR#:  [id number] DATE OF BIRTH:  02-Nov-1934  DATE OF PROCEDURE:  10/04/2013  PREOPERATIVE DIAGNOSES:  1. End-stage renal disease.  2. Non-usable right brachiobasilic arteriovenous fistula.  3. Hypertension.  4. History of stroke.   POSTOPERATIVE DIAGNOSES:  1. End-stage renal disease.  2. Non-usable right brachiobasilic arteriovenous fistula.  3. Hypertension.  4. History of stroke.   PROCEDURE:   Revision/transposition of right brachiobasilic AV fistula.   SURGEON: Algernon Huxley, MD   ANESTHESIA: General.   BLOOD LOSS: 50 mL.   INDICATION FOR PROCEDURE: A 80 year old African American male with end-stage renal disease. His fistula is not in a position to be accessed.  This is a brachiobasilic fistula.  This is on the underside of the arm and is deep and is not able to be used for his dialysis at current. We are bringing him in to perform a 2nd stage revision and transposition of this fistula to make it a usable access. Risks and benefits were discussed and informed consent is obtained.   DESCRIPTION OF PROCEDURE: The patient is brought to the operative suite, and after adequate level of general anesthesia obtained, the right upper extremity was sterilely prepped and draped and a sterile surgical field was created. The incision overlying the AV fistula was created from the antecubital fossa up near the axilla and we dissected out the basilic vein over this length until it joined the deep venous system in the axillary vein.  Many venous branches were ligated and divided between silk ties and the vein was marked for orientation. There was a good link. I used a curved Vanderbilt-type device to create a curved tunnel in the upper arm to put this in a usable position. I then heparinized the patient. I transected the fistula about 2 cm beyond the anastomosis after clamping with a profunda clamp. The vein was then tunneled and brought through the tunnel, keeping the markup  for orientation. I then prepared the vein for an anastomosis.  An end-to-end anastomosis was performed in a parachuting fashion with two 6-0 Prolene sutures to avoid pursestringing this.  The vessel was flushed and de-aired prior to releasing control. On release, there was a nice palpable thrill within the fistula. The wound was irrigated. Surgicel and Evicel topical hemostatic agents were placed. Hemostasis was complete. I closed the wound with a deep layer of 3-0 Vicryl, superficial layer of 3-0 Vicryl, and 4-0 Monocryl. Dermabond was placed as a dressing. The patient was awakened from anesthesia and taken to the recovery room in stable condition, having tolerated the procedure well.    ____________________________ Algernon Huxley, MD jsd:dd D: 10/04/2013 14:56:45 ET T: 10/05/2013 03:50:46 ET JOB#: [id number]  cc: Algernon Huxley, MD, <Dictator> Dr. Ronna Polio MD ELECTRONICALLY SIGNED 10/12/2013 10:30

## 2014-07-21 NOTE — Discharge Summary (Signed)
PATIENT NAME:  [name], [name] MR#:  [id number] DATE OF BIRTH:  08-22-1934  DATE OF ADMISSION:  05/24/2013 DATE OF DISCHARGE:  05/26/2013  ADDENDUM:  The patient was actually discharged on 05/26/2013.  There was  not enough staff at [facility] so the patient could not be discharged as planned. No other changes have been made.  ____________________________ [name]. [name], MD spm:rbg D: 05/26/2013 13:36:34 ET T: 05/26/2013 13:41:48 ET JOB#: [id number]  cc: [name] P. [name], MD, <Dictator> [name] [name] MD ELECTRONICALLY SIGNED 05/26/2013 14:04

## 2014-07-21 NOTE — Discharge Summary (Signed)
PATIENT NAMECRANSTON, [name] MR#:  [id number] DATE OF BIRTH:  1935-01-08  DATE OF ADMISSION:  05/24/2013 DATE OF DISCHARGE:  05/25/2013  ADMISSION DIAGNOSIS:  Weakness.   DISCHARGE DIAGNOSES: 1.  Pneumonia community-acquired.  2.  Frequent falls.  3.  End-stage renal disease, on hemodialysis.  4.  Chronic anemia.  5.  Hypertension.  6.  Displaced left femoral neck fracture, chronic.   CONSULTATIONS:   1.  Orthopedic surgery.   2.  Dr. Murlean Iba.  HOSPITAL COURSE:  A 80 year old male who has presented for fall, found to have a pneumonia on chest x-ray.  For further details, please refer to H and P.  1.  Pneumonia.  The patient was found to have a pneumonia on chest x-ray.  He was started on antibiotics.  He will need surveillance x-ray after a few weeks to rule out malignancy as there is a nodule noted on the chest x-ray.   2.  Frequent falls, likely due to blindness, malnourishment and hypoglycemia as well as his old chronic left hip fracture which is nonoperable at this time.  The patient will need to go to [facility] placement.  3.  Chronic left hip fracture.  The patient was seen at [facility] just a few months ago.  At that time dialysis was initiated.  He was considered high risk for surgery so they did not do surgery.  He is ambulatory with a walker.  Consultation from orthopedics was obtained while the patient was in the hospital at this time.  They did not recommend surgery at all secondary to his high risk and the fact that the patient is ambulating with his fracture.  He will need rehab placement.   4.  End stage renal disease, on hemodialysis.  The patient was continued on dialysis.  5.  Chronic anemia, stable.  6.  Hypertension.  There is no issues.   DISCHARGE MEDICATIONS: 1.  Aspirin 81 mg daily.  2.  Gabapentin 300 mg three times daily.  3.  Metoprolol 25 mg twice daily.  4.  Multivitamin 1 tablet daily.  5.  Levemir 1600 mg three times daily.  6.  Tramadol 50 mg q. 6 hours as  needed pain.  7.  Azithromycin 500 mg daily for five days.  8.  Haldol 0.5 mg by mouth q. 12 hours as needed agitation and hallucination.   DIET:  Renal diet.   DISCHARGE ACTIVITY:  Physical therapy as tolerated.    DISCHARGE FOLLOW-UP:  The patient can follow up with his primary care physician in one week.  Once again, a chest x-ray needs to be reordered in 3 to 4 weeks and the pulmonary nodule should be followed up.  The patient is medically stable for discharge.    ____________________________ Mikhai Bienvenue P. [name], MD spm:ea D: 05/25/2013 14:05:53 ET T: 05/26/2013 01:07:27 ET JOB#: [id number]  cc: Goldie Dimmer P. [name], MD, <Dictator> Leona Carry. Hall Busing, MD [name] Lauralie Blacksher MD ELECTRONICALLY SIGNED 05/26/2013 14:03

## 2014-07-21 NOTE — Discharge Summary (Signed)
PATIENT NAMELAMARIUS, DIRR MR#:  [id number] DATE OF BIRTH:  24-Apr-1934  DATE OF ADMISSION:  05/24/2013 DATE OF DISCHARGE:  05/25/2013  Addendum   REDO OF MEDICATIONS:  The patient is being discharged with:  1.  Tramadol 50 mg q.6 hours p.r.n.  2.  Gabapentin 300 mg t.i.d.  3.  Novolin N sliding scale insulin.  4.  Lantus 10 units at bedtime.  5.  Aspirin 81 mg daily.  6.  Haldol 0.5 mg q.12 hours p.r.n.  7.  Metoprolol 25 mg b.i.d.  8.  Azithromycin 500 mg daily for 6 days.  9.  Renagel 800 mg 2 tablets t.i.d. with meals.  10.  Rena-Vite 1 tablet daily.  11.  Nepro with Carb Steady oral supplement daily.   Time spent on the discharge was 35 minutes.     ____________________________ [name]. [name], MD spm:dmm D: 05/25/2013 14:08:38 ET T: 05/25/2013 22:11:51 ET JOB#: [id number]  cc: Tearra Ouk P. [name], MD, <Dictator> [name] Marielle Mantione MD ELECTRONICALLY SIGNED 05/26/2013 14:04

## 2014-07-21 NOTE — H&P (Signed)
PATIENT NAME:  [name], [name] MR#:  [id number] DATE OF BIRTH:  Aug 13, 1934  DATE OF ADMISSION:  11/17/2013  PRIMARY CARE PHYSICIAN: Sharlet Salina C. Hall Busing, MD.   CHIEF COMPLAINT: Rigors and chills,   HISTORY OF PRESENT ILLNESS: This is a 80 year old male who presents to the hospital due to rigors and chills from dialysis. The patient apparently was only 45 minutes into his dialysis when he started to have significant chills and rigors. He was noted to have a fever of 103. He was sent over to the ER for further evaluation. In the Emergency Room, the patient did have a fever. He was also tachycardic. The patient was clinically diagnosed with sepsis, but the source is unclear. The patient was empirically given IV antibiotics and hospitalist services were contacted for further treatment and evaluation. The patient denies any chest pain, any shortness of breath, any nausea, vomiting, cough, abdominal pain, diarrhea, or any other associated symptoms presently.   REVIEW OF SYSTEMS:  CONSTITUTIONAL: Positive documented fever. No weight gain or weight loss.  EYES: No blurred or double vision.  ENT: No tinnitus. No postnasal drip. No redness of the oropharynx.  RESPIRATORY: No cough, no wheeze, no hemoptysis, no dyspnea.  CARDIOVASCULAR: No chest pain, no orthopnea, no palpitations, no syncope.  GASTROINTESTINAL: No nausea, no vomiting, diarrhea. No abdominal pain. No melena or hematochezia.  GENITOURINARY: No dysuria and hematuria.  ENDOCRINE: No polyuria or nocturia. No heat or cold intolerance.  HEMATOLOGIC: No anemia, no bruising, no bleeding.  INTEGUMENTARY: No rashes. No lesions.  MUSCULOSKELETAL: No arthritis. No swelling. No gout.  NEUROLOGIC: No numbness or tingling. No ataxia. No seizure-type activity.  PSYCHIATRIC: No anxiety, no insomnia. No ADD.   PAST MEDICAL HISTORY: Consistent with diabetes, peripheral vascular disease, hypertension, end-stage renal disease on hemodialysis, Monday, Wednesday, Friday.    ALLERGIES: NO KNOWN DRUG ALLERGIES.   SOCIAL HISTORY: Used to be a smoker, quit many years ago. Does have a 20-30 pack-year smoking history. No alcohol abuse. No illicit drug abuse. Lives at home with his wife and his nephew.   FAMILY HISTORY: Mother and father both deceased. He does not know what they died from; both were diabetic.   CURRENT MEDICATIONS: Tylenol with hydrocodone 5/325, 1 tablet q. 4 hours as needed, aspirin 80 mg daily, metoprolol tartrate 25 mg b.i.d., NovoLog insulin sliding scale before meals and multivitamin daily.   PHYSICAL EXAMINATION:  VITAL SIGNS:  Temperature 101, pulse 107, respirations 24, blood pressure 128/92, saturations 98% on 2 liters nasal cannula.  GENERAL: He is a pleasant-appearing male but in no apparent distress.  HEAD, EYES, EARS, NOSE AND THROAT: Atraumatic, normocephalic. Extraocular muscles are unable to assess as he is legally blind.  NECK: Supple. There is no jugular venous distention.  No bruits, no lymphadenopathy, no thyromegaly.  HEART: Regular rate and rhythm, tachycardic. He does have a 2/6 systolic ejection murmur heard at the right sternal border. No rubs, no clicks.  LUNGS: Clear to auscultation bilaterally. No rales or rhonchi. No wheezes.  ABDOMEN: Soft, flat, nontender, nondistended. Has good bowel sounds. No hepatosplenomegaly appreciated.  EXTREMITIES: No evidence of any cyanosis, clubbing. Peripheral edema. Has +2 pedal and radial pulses on the right. 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  SKIN: Moist and warm with no rashes.  LYMPHATIC: There is no cervical or axillary lymphadenopathy.  NEUROLOGIC: The patient is alert, awake and oriented x3 with no focal motor or sensory deficits distribution bilaterally. The patient does have a right upper extremity AV fistula  with good bruit and good thrill, but no evidence of any drainage and also has a right subclavian dialysis catheter, with no evidence of any acute drainage.    LABORATORY DATA: Serum glucose of 117, BUN 29, creatinine 5.7, sodium 140, potassium 4.5, chloride 102, bicarbonate 26. Albumin 3.0.  Otherwise LFTs within normal limits. White cell count 9.4, hemoglobin 11.1, hematocrit 37.0, platelet count 266,000.  Troponin 0.04. ABG showed a pH of 7.4, pCO2 of 38, pO2 of 80, saturations of 95%. Lactic acid 1.3.   RADIOLOGY DATA:  The patient did have a chest x-ray done which showed no acute findings.   ASSESSMENT AND PLAN: A 80 year old male with a history of end-stage renal disease on hemodialysis, peripheral vascular disease, diabetes, hypertension who presents to the hospital from dialysis due to rigors and chills and noted to have a fever of 103.  1.  Sepsis. This is the likely diagnosis, given the patient's presentation of fever and tachycardia. The source of the sepsis is unclear. Questionable if is related to a dialysis catheter.  Although there is no evidence of acute drainage from either the PermCath site or even the AV fistula. His chest x-ray is negative for pneumonia. For now, we will treat empirically with IV vancomycin and Zosyn, follow blood cultures. Will narrow his antibiotics accordingly.  2.  Altered mental status. This is likely metabolic encephalopathy secondary to sepsis.  His mental status has significantly improved after he has gotten some IV antibiotics. I will continue to monitor.  3.  Diabetes. Continue with his sliding-scale insulin.  4.  Hypertension. Continue metoprolol.   CODE STATUS: The patient is a full code.   TIME SPENT ON ADMISSION: 50 minutes.    ____________________________ Belia Heman. Verdell Carmine, MD vjs:DT D: 11/17/2013 15:02:20 ET T: 11/17/2013 15:59:35 ET JOB#: [id number]  cc: Belia Heman. Verdell Carmine, MD, <Dictator> Henreitta Leber MD ELECTRONICALLY SIGNED 11/25/2013 11:48

## 2014-07-21 NOTE — Op Note (Signed)
PATIENT NAMEMALVIN, [name] MR#:  [id number] DATE OF BIRTH:  1935/01/13  DATE OF PROCEDURE:  11/18/2013  PREOPERATIVE DIAGNOSES: 1. Catheter-related sepsis.  2. End-stage renal disease.   PROCEDURE PERFORMED: Removal of cuffed tunneled dialysis catheter, right internal jugular .   SURGEON: Hortencia Pilar, MD.  PROCEDURE:  The patient is in his hospital bed. He is positioned supine. Lidocaine 1% lidocaine with epinephrine is infiltrated in the soft tissue surrounding the cuff and a small incision is created with an 11 blade. Dissection is carried down to expose the catheter, which is then grasped and the cuff is freed from the surrounding adhesions. The catheter is removed. Pressure is held at the base of the neck and 4-0 Monocryl is used to close the tunnel and then the skin subcutaneously. Dermabond is applied and a sterile dressing is placed at the exit site. The patient tolerated the procedure well and there were no immediate complications. Tips are sent for culture.    ____________________________ Katha Cabal, MD ggs:NT D: 11/18/2013 16:27:22 ET T: 11/18/2013 17:02:07 ET JOB#: [id number]  cc: Katha Cabal, MD, <Dictator> Katha Cabal MD ELECTRONICALLY SIGNED 12/05/2013 22:38

## 2014-07-21 NOTE — Consult Note (Signed)
Brief Consult Note: Diagnosis: left femoral neck fracture, left AKA.   Patient was seen by consultant.   Consult note dictated.   Comments: Hx of left hip fracture 6 months ago, apparently felt to be inoperable as per [facility]. Notes from [facility] have been requested but are not available. Patient actually complains of distal stump pain and denies significant groin pain. 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 Patient states that he has not used a prosthesis for more than 4 years. I would not recommend surgical intervention at this time. I suggest continuation of pain management.  Electronic Signatures: Dereck Leep (MD)  (Signed 24-Feb-15 11:04)  Authored: Brief Consult Note   Last Updated: 24-Feb-15 11:04 by Dereck Leep (MD)

## 2014-07-21 NOTE — Discharge Summary (Signed)
PATIENT NAMEARLISS, [name] MR#:  [id number] DATE OF BIRTH:  04-28-1934  DATE OF ADMISSION:  11/17/2013 DATE OF DISCHARGE:  11/20/2013  PRIMARY CARE PHYSICIAN: Dr. Benita Stabile.  FINAL DIAGNOSES:  1. Clinical sepsis, right chest catheter complication.  2. End-stage renal disease.  3. Hypertension.  4. Diabetes.   MEDICATIONS ON DISCHARGE: Include Rena-Vite 1 tablet daily, metoprolol tartrate 25 mg twice a day, aspirin 81 mg daily, acetaminophen hydrocodone 325/5 one tablet every 4 hours as needed for pain, NovoLog subcutaneous injection sliding scale 3 times a day before meals, acetaminophen 500 mg every 6 hours as needed for pain, gabapentin 300 mg at bedtime, Nepro carb 237 mL once a day, Levaquin 500 mg every 48 hours in the evening.   DIET: Renal diet, regular consistency.   ACTIVITY: As tolerated. Dialysis Monday, Wednesday, Friday.  FOLLOWUP: Follow up in 1-2 weeks with Dr. Benita Stabile.   HOSPITAL COURSE: The patient was admitted 11/17/2013 and discharged 11/20/2013. Came in with rigors and chills, was admitted for clinical sepsis, unknown source, was started on vancomycin and Zosyn.   LABORATORY AND RADIOLOGICAL DATA: During the hospital course: Included an EKG that showed sinus tachycardia, left axis deviation, incomplete right bundle branch block.  INR 1.2. Troponin negative. Magnesium 1.3. Glucose 117, BUN 29, creatinine 5.17, sodium 140, potassium 4.5, chloride 102, CO2 of 26, calcium 7.3. Liver function tests: AST is slightly elevated at 49. White blood cell count 9.4, H and H 11.1 and 37.0, platelet count of 266,000. Three out of 4 blood cultures are negative. Fourth blood culture positive with gram-negative rod. Lactic acid 1.3. ABG showed a pH of 7.4, pCO2 of 38, pO2 of 80 FiO2 of 24%, bicarbonate 25.2.   Chest x-ray showed no acute findings.   Catheter tip showed Klebsiella pneumoniae sensitive to Levaquin and also Chryseobacterium indologenes both sensitive to Levaquin.  Creatinine upon discharge 6.87, glucose 185.   HOSPITAL COURSE PER PROBLEM LIST:  1. For the patient's clinical sepsis with the patient's chest x-ray negative, urine culture negative, the only other option was the right chest catheter. I called Dr. Ronalee Belts in consultation. He removed the catheter on 11/18/2013. The patient has been afebrile and doing well since the catheter removal and on IV antibiotics, initially Zosyn and vancomycin. He was switched over to Levaquin and will complete a course of Levaquin upon discharge, another 5 pills because it will be dosed every other day. He will be given a dose of Levaquin on the day of discharge so his first dose will be orally as outpatient on the 26th. A total of 14 days will be given including the hospital course.  2. End-stage renal disease. Hemodialysis Monday, Wednesday, Friday as per nephrology.  3. Hypertension. Stable on metoprolol.  4. Diabetes. On sliding scale.   TIME SPENT ON DISCHARGE: 35 minutes.    ____________________________ Tana Conch. Leslye Peer, MD rjw:lt D: 11/20/2013 14:37:19 ET T: 11/21/2013 02:03:28 ET JOB#: [id number]  cc: Tana Conch. Leslye Peer, MD, <Dictator> Leona Carry. Hall Busing, MD Marisue Brooklyn MD ELECTRONICALLY SIGNED 12/01/2013 15:06

## 2014-07-21 NOTE — H&P (Signed)
PATIENT NAME:  [name], [name] MR#:  [id number] DATE OF BIRTH:  1934/05/07  DATE OF ADMISSION:  05/22/2013  PRIMARY CARE PHYSICIAN: Sharlet Salina C. Hall Busing, MD  CHIEF COMPLAINT: Weakness and left hip pain.   HISTORY OF PRESENT ILLNESS:  This is a 80 year old male who initiated hemodialysis approximately 6 months ago. At that time, he is seen at [facility] for apparent fall. He suffered a left hip fracture. At that time, hemodialysis was initiated and apparently they were unable to do left hip surgery. As per the family, he was high risk for this procedure.  Since that time, the patient has had 3 falls and is here basically for weakness and this left hip pain. I tried to tell the family that the left hip pain is likely from this left hip fracture that has not been fixed. They are also complaining that the patient is now today urinating on himself.   REVIEW OF SYSTEMS: CONSTITUTIONAL: No fever. Positive fatigue and weakness.  EYES: He is partially blind.  EARS, NOSE, THROAT: Positive hearing loss. No discharge, epistaxis, tinnitus.   RESPIRATORY:  No cough, wheezing, hemoptysis, dyspnea, painful respirations.  CARDIOVASCULAR:  He denies any chest pain, orthopnea, edema, arrhythmia, dyspnea on exertion, palpitations or syncope.  GASTROINTESTINAL:  No nausea, vomiting, abdominal pain. He occasionally has some diarrhea.  GENITOURINARY:  He does have some frequency and dysuria.  ENDOCRINE: No polyuria or polydipsia. HEMATOLOGIC AND LYMPHATIC: Positive anemia and easy bruising.  SKIN: No rash or lesions.  MUSCULOSKELETAL: Limited activity basically due to his eyesight.  NEUROLOGICAL:  No history of CVA, TIA or seizures.  PSYCHIATRIC: No history of anxiety or depression.   PAST MEDICAL HISTORY: 1.  Diabetes.  2.  Hypertension.  3.  End-stage renal disease, on hemodialysis.   PAST SURGICAL HISTORY: 1.  Above-the-knee amputation.  2.  He has got a right AV fistula.   ALLERGIES: No known drug allergies.     MEDICATIONS:  1.  Tramadol 50 mg q.6 hours p.r.n.  1.  Renagel 800 mg 2 tablets t.i.d.  2.  Rena-Vite 1 tablet daily.  3.  Novolin sliding scale.  4.  Metoprolol 25 mg b.i.d.  5.  Lantus 10 units at bedtime.  6.  Gabapentin 300 mg t.i.d.  7.  Aspirin 81 mg daily.   SOCIAL HISTORY:  No tobacco, alcohol or IV drug use.   FAMILY HISTORY: Positive for diabetes.   PHYSICAL EXAMINATION: VITAL SIGNS: Temperature 98.5, pulse 92, respirations 18, blood pressure 143/71, 95% on room air.  GENERAL: The patient is alert, oriented, does not appear to be in acute distress.  HEENT: Head is atraumatic. The patient is partially blind in both eyes. His left eye is discolored. Oropharynx is clear without any exudates.  NECK: The neck is supple. No JVD or carotid bruit.  CARDIOVASCULAR: Regular rate and rhythm. No murmur, gallop or rub. PMI is not displaced.  LUNGS:  Clear to auscultation without murmurs, rhonchi or wheezing. Normal to percussion. Normal chest expansion.  ABDOMEN: Bowel sounds positive. Nontender, nondistended. No hepatomegaly.  EXTREMITIES: No clubbing, cyanosis or edema.  NEUROLOGIC: The patient is partially blind. All other cranial nerves seem to be intact. No focal deficits. Finger-to-nose is intact. The patient has amputation of the left leg.  SKIN: Without any rashes or lesions.   LABORATORY, DIAGNOSTIC AND RADIOLOGICAL DATA:   1.  Troponin 0.07, CK 91, CK-MB  0.7. INR is 1.2. White blood cells 8.5, hemoglobin 11, hematocrit 35, platelets 382. Sodium 136,  potassium 4.8, chloride 99, bicarb 29, BUN 59, creatinine 6.97, glucose 240, bilirubin 0.4, alk phos 110, ALT 64, AST 57, total protein 7.7 and albumin 2.5.  2.  Chest x-ray shows no acute cardiopulmonary disease.  3.  CT of the head shows no acute cranial hemorrhage or CVA.  4.  EKG is normal sinus rhythm. No ST elevation or depression.  5.  Left hip x-ray showed an old left hip fracture.   ASSESSMENT AND PLAN:  This is a  80 year old male who has a left above-knee amputation, a left hip fracture diagnosed about 6 months ago at [facility] and did not receive surgery due to multiple comorbidities as per the family, who presents today with weakness and falls.  1.  Weakness.  This is likely secondary to his hip fracture that has not been operated on. Apparently, the patient was nonoperable at the time. I am requesting some records from [facility]. In the meantime, I will go ahead and consult orthopedic surgery and see if they want to order other tests for this such as a CT scan for this left hip. I also ordered physical therapy consultation. I did discuss with the family that most likely his weakness frequent falls is from this left-sided pain. It is also noted the patient has a left above-the-knee amputation.    2.  Will go ahead and also check a UA as the patient was complaining of some frequency and dysuria and urinating on himself.  3.  End-stage renal disease, on hemodialysis. Apparently, the patient presented from the dialysis unit. They were saying that his right arm is warm to touch. His right arm is not warm to touch.  It has got a functioning fistula. I do not see any evidence of cellulitis. I will consult nephrology.  He will need to continue dialysis.  4.  Hypertension: Continue outpatient medications.  5.  Diabetes. The patient will continue outpatient medications, sliding scale insulin, ADA diet.  6.  The patient is full CODE STATUS.  TIME SPENT: Approximately 50 minutes.   ____________________________ [name]. [name], MD spm:cs D: 05/22/2013 14:45:50 ET T: 05/22/2013 15:14:20 ET JOB#: [id number]  cc: Dalya Maselli P. [name], MD, <Dictator>             Dennt Melvern Banker P Birdella Sippel MD ELECTRONICALLY SIGNED 05/22/2013 16:21

## 2014-07-21 NOTE — H&P (Signed)
PATIENT NAME:  [name], CRYSLER MR#:  [id number] DATE OF BIRTH:  1934-04-04  DATE OF ADMISSION:  05/22/2013  ADDENDUM  The patient is also noted to have an elevated troponin of 0.07. For his elevated troponin, which I feel like is probably from poor renal clearance, we will continue to monitor, also cycle 2 more enzymes. The patient is placed on telemetry due to the elevation in the troponin.   ____________________________ Jobina Maita P. [name], MD spm:np D: 05/22/2013 14:49:08 ET T: 05/22/2013 15:04:28 ET JOB#: [id number]  cc: Sakira Dahmer P. [name], MD, <Dictator> [name] Elena Davia MD ELECTRONICALLY SIGNED 05/22/2013 16:20

## 2014-07-21 NOTE — Op Note (Signed)
PATIENT NAME:  [name], [name] MR#:  [id number] DATE OF BIRTH:  Jul 19, 1934  DATE OF PROCEDURE:  05/08/2013  PREOPERATIVE DIAGNOSIS: End-stage renal disease with functional permanent dialysis access and no longer needing PermCath.   POSTOPERATIVE DIAGNOSIS: End-stage renal disease with functional permanent dialysis access and no longer needing PermCath.   PROCEDURE: Removal of right jugular PermCath.   SURGEON: [place], PA-C.   ANESTHESIA: Local.   ESTIMATED BLOOD LOSS: Minimal.   INDICATION FOR THE PROCEDURE: The patient is a 80 year old male with end-stage renal disease. His access is functional and he no longer needs the PermCath, therefore this will be removed.   DESCRIPTION OF THE PROCEDURE: The patient is brought to the vascular interventional radiology area and positioned supine. The right neck and chest and existing catheter were sterilely prepped and draped and a sterile surgical field was created. The area was locally anesthetized copiously with 1% lidocaine. Hemostats were used to help dissect out the cuff. An 11 blade was used to transect the fibrous sheath connected to the cuff. The catheter was then removed in its entirety without difficulty with gentle traction. Pressure was held at the base of the neck. Sterile dressing was placed. The patient tolerated the procedure well. No complications.    ____________________________ Marin Shutter Nabria Nevin, PA-C cnh:aw D: 05/08/2013 08:51:06 ET T: 05/08/2013 12:12:56 ET JOB#: [id number]  cc: Nakita Santerre N. Anushri Casalino, PA-C, <Dictator> G[place]a PA ELECTRONICALLY SIGNED 05/10/2013 12:10

## 2014-07-21 NOTE — Consult Note (Signed)
PATIENT NAME:  [name], [name] MR#:  [id number] DATE OF BIRTH:  Nov 09, 1934  DATE OF CONSULTATION:  05/23/2013  REFERRING PHYSICIAN:  Sital P. [name], MD CONSULTING PHYSICIAN:  Laurice Record. Holley Bouche., MD  CHIEF COMPLAINT: Left hip fracture.   HISTORY OF PRESENT ILLNESS: The patient is a 80 year old male who was admitted to observation for multiple falls and weakness. The patient has a history of falling approximately 6 months ago, at which time he apparently sustained left femoral neck fracture. He was evaluated at [facility] and was apparently initiated with hemodialysis, and family was informed that he was extremely high risk for any type of surgical intervention. No new trauma to the leg. The patient actually complains of pain to the distal aspect of the residual limb (the patient is status post a remote left above-knee amputation). He had been ambulating without lower extremity prosthesis for more than 4 years using crutches. He had apparently been instructed on using a wheelchair for ambulation after the injury.   Family was not available today. Records had been requested from [facility], but were not available for a review.   PAST MEDICAL HISTORY:  1. Diabetes.  2. Hypertension.  3. End-stage renal disease, currently on hemodialysis.  4. Status post left above-knee amputation.  5. Status post right AV fistula.   MEDICATIONS AT THE TIME OF ADMISSION:  1. Tramadol 50 mg every 6 hours as needed for pain.  2. Renagel 800 mg 2 tablets t.i.d. 3. Rena-Vite 1 tablet daily.  4. Sliding scale insulin.  5. Metoprolol 25 mg b.i.d. 6. Lantus 10 units at bedtime.  7. Gabapentin 300 mg t.i.d. 8. Aspirin 81 mg daily.   ALLERGIES: No known drug allergies.   FAMILY HISTORY: Positive for diabetes.   SOCIAL HISTORY: No current tobacco, alcohol or IV drug abuse. He apparently presented from a nursing facility.   REVIEW OF SYSTEMS: Pertinent review of systems is notable for:  CONSTITUTIONAL:  Fatigue and generalized weakness.  HEENT: He is partially blind and also has some hearing loss.  MUSCULOSKELETAL: Notable for some left end of limb pain and phantom pain.   PHYSICAL EXAMINATION:  GENERAL: Elderly-appearing thin male seen in no acute distress.  HEENT: Atraumatic, normocephalic. Oropharynx is clear. Poor vision is noted with opacity noted most prominently to the left eye.  NECK: Supple, nontender, with good range of motion.  LUNGS: Clear to auscultation bilaterally.  CARDIAC: Regular rate and rhythm with normal S1, S2. There is a 2/6 soft murmur. No gallops or rubs.  MUSCULOSKELETAL: Good range of motion, strength, stability of the upper extremities. Examination of the left lower extremity demonstrates well-healed above-knee amputation. No evidence of skin breakdown, erythema or swelling. The patient demonstrates active range of motion of the residual limb without any significant complaints of pain. No tenderness over the trochanteric area. Skin over the trochanteric region and buttocks appears to be intact without evidence of breakdown or erythema.  NEUROLOGIC: Awake, alert. Sensory function is grossly intact. Motor strength is felt to be 4/5 to 5/5. No clonus or tremor.   X-RAYS: AP pelvis, AP and lateral radiographs of the left hip dated 05/22/2013 were reviewed. Diffuse osteopenia is noted to the left femur. Displaced left femoral neck fracture is noted. Erosive changes are noted to the distal aspect of the head consistent with older injury. Films were compared to left hip films from [facility] dated 05/03/2013, and no significant change is appreciated in that interval.   IMPRESSION:  1. Displaced left femoral neck fracture, chronic.  2. Left above-knee amputation.   PLAN: Findings were discussed in detail with the patient. As noted above, records from [facility] were not available. However, the fracture was apparently deemed to be inoperable at that  point. The high risk of surgical intervention was discussed. At the present time, most of the symptoms do not seem to be centered to the hip directly. He is apparently mobile with crutches as well as use of a wheelchair. At the present time, I would not recommend surgical intervention given the high degree of risk involved. Would agree with continuation of appropriate pain management as well as consideration of physical therapy to assist with stability in terms of transfers.    ____________________________ Laurice Record. Holley Bouche., MD jph:lb D: 05/23/2013 11:16:36 ET T: 05/23/2013 12:12:25 ET JOB#: [id number]  cc: Jeneen Rinks P. Holley Bouche., MD, <Dictator> JAMES P Holley Bouche MD ELECTRONICALLY SIGNED 05/28/2013 8:23

## 2014-10-28 ENCOUNTER — Inpatient Hospital Stay
Admission: EM | Admit: 2014-10-28 | Discharge: 2014-10-31 | DRG: 193 | Disposition: A | Discharge status: Home / Self Care | Payer: Medicare (Managed Care) | Attending: Internal Medicine | Admitting: Internal Medicine

## 2014-10-28 ENCOUNTER — Emergency Department: Payer: Medicare (Managed Care)

## 2014-10-28 DIAGNOSIS — I739 Peripheral vascular disease, unspecified: Secondary | ICD-10-CM | POA: Diagnosis present

## 2014-10-28 DIAGNOSIS — Z87891 Personal history of nicotine dependence: Secondary | ICD-10-CM | POA: Diagnosis not present

## 2014-10-28 DIAGNOSIS — J189 Pneumonia, unspecified organism: Secondary | ICD-10-CM | POA: Diagnosis present

## 2014-10-28 DIAGNOSIS — H409 Unspecified glaucoma: Secondary | ICD-10-CM | POA: Diagnosis present

## 2014-10-28 DIAGNOSIS — I120 Hypertensive chronic kidney disease with stage 5 chronic kidney disease or end stage renal disease: Secondary | ICD-10-CM | POA: Diagnosis present

## 2014-10-28 DIAGNOSIS — Z7982 Long term (current) use of aspirin: Secondary | ICD-10-CM | POA: Diagnosis not present

## 2014-10-28 DIAGNOSIS — H548 Legal blindness, as defined in USA: Secondary | ICD-10-CM | POA: Diagnosis present

## 2014-10-28 DIAGNOSIS — R0902 Hypoxemia: Secondary | ICD-10-CM | POA: Diagnosis present

## 2014-10-28 DIAGNOSIS — E11649 Type 2 diabetes mellitus with hypoglycemia without coma: Secondary | ICD-10-CM | POA: Diagnosis present

## 2014-10-28 DIAGNOSIS — N186 End stage renal disease: Secondary | ICD-10-CM | POA: Diagnosis present

## 2014-10-28 DIAGNOSIS — Z992 Dependence on renal dialysis: Secondary | ICD-10-CM

## 2014-10-28 DIAGNOSIS — Z89612 Acquired absence of left leg above knee: Secondary | ICD-10-CM | POA: Diagnosis not present

## 2014-10-28 DIAGNOSIS — J9601 Acute respiratory failure with hypoxia: Secondary | ICD-10-CM | POA: Diagnosis present

## 2014-10-28 DIAGNOSIS — N2581 Secondary hyperparathyroidism of renal origin: Secondary | ICD-10-CM | POA: Diagnosis present

## 2014-10-28 DIAGNOSIS — Y95 Nosocomial condition: Secondary | ICD-10-CM | POA: Diagnosis present

## 2014-10-28 HISTORY — DX: Type 2 diabetes mellitus without complications: E11.9

## 2014-10-28 HISTORY — DX: Unspecified glaucoma: H40.9

## 2014-10-28 LAB — CBC WITH DIFFERENTIAL/PLATELET
Basophils Absolute: 0.1 10*3/uL (ref 0–0.1)
Basophils Relative: 1 %
Eosinophils Absolute: 0.1 10*3/uL (ref 0–0.7)
Eosinophils Relative: 2 %
HCT: 30.1 % — ABNORMAL LOW (ref 40.0–52.0)
Hemoglobin: 9.3 g/dL — ABNORMAL LOW (ref 13.0–18.0)
Lymphocytes Relative: 18 %
Lymphs Abs: 1.2 10*3/uL (ref 1.0–3.6)
MCH: 21.3 pg — ABNORMAL LOW (ref 26.0–34.0)
MCHC: 31 g/dL — ABNORMAL LOW (ref 32.0–36.0)
MCV: 68.6 fL — ABNORMAL LOW (ref 80.0–100.0)
Monocytes Absolute: 0.9 10*3/uL (ref 0.2–1.0)
Monocytes Relative: 13 %
Neutro Abs: 4.5 10*3/uL (ref 1.4–6.5)
Neutrophils Relative %: 66 %
Platelets: 468 10*3/uL — ABNORMAL HIGH (ref 150–440)
RBC: 4.4 MIL/uL (ref 4.40–5.90)
RDW: 20.4 % — ABNORMAL HIGH (ref 11.5–14.5)
WBC: 6.7 10*3/uL (ref 3.8–10.6)

## 2014-10-28 LAB — LACTIC ACID, PLASMA
Lactic Acid, Venous: 1 mmol/L (ref 0.5–2.0)
Lactic Acid, Venous: 1.2 mmol/L (ref 0.5–2.0)

## 2014-10-28 LAB — COMPREHENSIVE METABOLIC PANEL
ALK PHOS: 89 U/L (ref 38–126)
ALT: 21 U/L (ref 17–63)
ANION GAP: 13 (ref 5–15)
AST: 30 U/L (ref 15–41)
Albumin: 3 g/dL — ABNORMAL LOW (ref 3.5–5.0)
BUN: 40 mg/dL — ABNORMAL HIGH (ref 6–20)
CO2: 30 mmol/L (ref 22–32)
Calcium: 7.6 mg/dL — ABNORMAL LOW (ref 8.9–10.3)
Chloride: 98 mmol/L — ABNORMAL LOW (ref 101–111)
Creatinine, Ser: 7.4 mg/dL — ABNORMAL HIGH (ref 0.61–1.24)
GFR calc Af Amer: 7 mL/min — ABNORMAL LOW (ref >60)
GFR calc non Af Amer: 6 mL/min — ABNORMAL LOW (ref >60)
GLUCOSE: 114 mg/dL — AB (ref 65–99)
POTASSIUM: 4 mmol/L (ref 3.5–5.1)
SODIUM: 141 mmol/L (ref 135–145)
TOTAL PROTEIN: 7 g/dL (ref 6.5–8.1)
Total Bilirubin: 0.5 mg/dL (ref 0.3–1.2)

## 2014-10-28 LAB — GLUCOSE, CAPILLARY: Glucose-Capillary: 89 mg/dL (ref 65–99)

## 2014-10-28 MED ORDER — ACETAMINOPHEN 650 MG RE SUPP: 650.0000 mg | Freq: Four times a day (QID) | RECTAL | Status: DC | PRN

## 2014-10-28 MED ORDER — ACETAMINOPHEN 325 MG PO TABS
650.0000 mg | ORAL_TABLET | Freq: Four times a day (QID) | ORAL | Status: DC | PRN
  Administered 2014-10-30: 650 mg via ORAL
  Filled 2014-10-28: qty 2

## 2014-10-28 MED ORDER — VANCOMYCIN HCL 500 MG IV SOLR
500.0000 mg | INTRAVENOUS | Status: AC
  Administered 2014-10-28: 500 mg via INTRAVENOUS
  Filled 2014-10-28: qty 500

## 2014-10-28 MED ORDER — ONDANSETRON HCL 4 MG PO TABS: 4.0000 mg | ORAL_TABLET | Freq: Four times a day (QID) | ORAL | Status: DC | PRN

## 2014-10-28 MED ORDER — HEPARIN SODIUM (PORCINE) 5000 UNIT/ML IJ SOLN
5000.0000 [IU] | Freq: Three times a day (TID) | INTRAMUSCULAR | Status: DC
  Administered 2014-10-28 – 2014-10-31 (×6): 5000 [IU] via SUBCUTANEOUS
  Filled 2014-10-28 (×7): qty 1

## 2014-10-28 MED ORDER — DOCUSATE SODIUM 100 MG PO CAPS
100.0000 mg | ORAL_CAPSULE | Freq: Two times a day (BID) | ORAL | Status: DC
  Administered 2014-10-28 – 2014-10-30 (×4): 100 mg via ORAL
  Filled 2014-10-28 (×4): qty 1

## 2014-10-28 MED ORDER — ZOLPIDEM TARTRATE 5 MG PO TABS: 5.0000 mg | ORAL_TABLET | Freq: Every evening | ORAL | Status: DC | PRN

## 2014-10-28 MED ORDER — PIPERACILLIN-TAZOBACTAM 3.375 G IVPB
3.3750 g | Freq: Two times a day (BID) | INTRAVENOUS | Status: DC
  Administered 2014-10-28 – 2014-10-30 (×5): 3.375 g via INTRAVENOUS
  Filled 2014-10-28 (×9): qty 50

## 2014-10-28 MED ORDER — ONDANSETRON HCL 4 MG/2ML IJ SOLN
4.0000 mg | Freq: Four times a day (QID) | INTRAMUSCULAR | Status: DC | PRN
  Administered 2014-10-29: 4 mg via INTRAVENOUS
  Filled 2014-10-28 (×2): qty 2

## 2014-10-28 MED ORDER — RENA-VITE PO TABS
1.0000 | ORAL_TABLET | Freq: Every day | ORAL | Status: DC
  Administered 2014-10-29 – 2014-10-30 (×2): 1 via ORAL
  Filled 2014-10-28 (×3): qty 1

## 2014-10-28 MED ORDER — PIPERACILLIN-TAZOBACTAM 3.375 G IVPB 30 MIN: 3.3750 g | Freq: Three times a day (TID) | INTRAVENOUS | Status: DC

## 2014-10-28 MED ORDER — SODIUM CHLORIDE 0.9 % IJ SOLN
3.0000 mL | Freq: Two times a day (BID) | INTRAMUSCULAR | Status: DC
  Administered 2014-10-28 – 2014-10-30 (×5): 3 mL via INTRAVENOUS

## 2014-10-28 MED ORDER — SODIUM CHLORIDE 0.9 % IV SOLN: 250.0000 mL | INTRAVENOUS | Status: DC | PRN

## 2014-10-28 MED ORDER — VITAMIN C 500 MG PO TABS
500.0000 mg | ORAL_TABLET | Freq: Every day | ORAL | Status: DC
  Administered 2014-10-29 – 2014-10-30 (×2): 500 mg via ORAL
  Filled 2014-10-28 (×3): qty 1

## 2014-10-28 MED ORDER — ASPIRIN EC 81 MG PO TBEC
81.0000 mg | DELAYED_RELEASE_TABLET | Freq: Every day | ORAL | Status: DC
  Administered 2014-10-29 – 2014-10-30 (×2): 81 mg via ORAL
  Filled 2014-10-28 (×3): qty 1

## 2014-10-28 MED ORDER — SODIUM CHLORIDE 0.9 % IJ SOLN: 3.0000 mL | INTRAMUSCULAR | Status: DC | PRN

## 2014-10-28 MED ORDER — INSULIN ASPART 100 UNIT/ML ~~LOC~~ SOLN
0.0000 [IU] | Freq: Three times a day (TID) | SUBCUTANEOUS | Status: DC
  Administered 2014-10-30: 1 [IU] via SUBCUTANEOUS
  Administered 2014-10-30: 2 [IU] via SUBCUTANEOUS
  Administered 2014-10-31: 1 [IU] via SUBCUTANEOUS
  Filled 2014-10-28: qty 3
  Filled 2014-10-28 (×2): qty 1
  Filled 2014-10-28: qty 2

## 2014-10-28 MED ORDER — METOPROLOL TARTRATE 50 MG PO TABS
50.0000 mg | ORAL_TABLET | Freq: Two times a day (BID) | ORAL | Status: DC
  Administered 2014-10-28 – 2014-10-30 (×5): 50 mg via ORAL
  Filled 2014-10-28 (×5): qty 1

## 2014-10-28 MED ORDER — ALBUTEROL SULFATE (2.5 MG/3ML) 0.083% IN NEBU: 2.5000 mg | INHALATION_SOLUTION | RESPIRATORY_TRACT | Status: DC | PRN

## 2014-10-28 MED ORDER — GABAPENTIN 300 MG PO CAPS
300.0000 mg | ORAL_CAPSULE | Freq: Three times a day (TID) | ORAL | Status: DC
  Administered 2014-10-28 – 2014-10-30 (×7): 300 mg via ORAL
  Filled 2014-10-28 (×7): qty 1

## 2014-10-28 MED ORDER — LISINOPRIL 10 MG PO TABS
10.0000 mg | ORAL_TABLET | Freq: Every day | ORAL | Status: DC
  Administered 2014-10-29 – 2014-10-30 (×2): 10 mg via ORAL
  Filled 2014-10-28 (×3): qty 1

## 2014-10-28 MED ORDER — VANCOMYCIN HCL 500 MG IV SOLR
500.0000 mg | INTRAVENOUS | Status: DC | PRN
  Filled 2014-10-28: qty 500

## 2014-10-28 MED ORDER — VANCOMYCIN HCL IN DEXTROSE 1-5 GM/200ML-% IV SOLN
1000.0000 mg | Freq: Once | INTRAVENOUS | Status: AC
  Administered 2014-10-28: 1000 mg via INTRAVENOUS
  Filled 2014-10-28: qty 200

## 2014-10-28 NOTE — Progress Notes (Signed)
ANTIBIOTIC CONSULT NOTE - INITIAL  Pharmacy Consult for Vancomycin/Zosyn Indication: HCAP  No Known Allergies  Patient Measurements: Height: 5\' 7"  (170.2 cm) Weight: 130 lb (58.968 kg) IBW/kg (Calculated) : 66.1 Adjusted Body Weight: 59  Vital Signs: Temp: 99.7 F (37.6 C) (07/31 1644) Temp Source: Oral (07/31 1644) BP: 164/69 mmHg (07/31 2030) Pulse Rate: 90 (07/31 2030) Intake/Output from previous day:   Intake/Output from this shift:    Labs:  Recent Labs  10/28/14 1655  WBC 6.7  HGB 9.3*  PLT 468*  CREATININE 7.40*   Estimated Creatinine Clearance: 7 mL/min (by C-G formula based on Cr of 7.4). No results for input(s): VANCOTROUGH, VANCOPEAK, VANCORANDOM, GENTTROUGH, GENTPEAK, GENTRANDOM, TOBRATROUGH, TOBRAPEAK, TOBRARND, AMIKACINPEAK, AMIKACINTROU, AMIKACIN in the last 72 hours.   Microbiology: No results found for this or any previous visit (from the past 720 hour(s)).  Medical History: Past Medical History  Diagnosis Date  . Diabetes mellitus without complication   . Glaucoma     Medications:   (Not in a hospital admission) Assessment: Pt on HD on MWF  Goal of Therapy:  Vanc trough 15 - 25 mcg/mL   Plan:  Expected duration 7 days with resolution of temperature and/or normalization of WBC   Zosyn 3.375 gm IV Q12H EI.   Vancomcyin 1500 mg IV X 1 given as loading dose in ED on 7/31. Vancomycin 500 mg IV ordered to be given during last 30 minutes of each HD session. Will assume pt starts HD on 8/1, will draw 1st Vanc trough 30 minutes before 3rd HD session on 8/5.   Kailey Esquilin D 10/28/2014,8:52 PM

## 2014-10-28 NOTE — ED Notes (Signed)
Pt presents via EMS from home c/o cough and SOB x 3 weeks. Has been treated recently with amoxicillin twice and most recently with z-pack. [name] c/o continuous cough. 02 sat in mid 30s per EMS, improved to 96 on 3L 02 via [place].

## 2014-10-28 NOTE — H&P (Signed)
[facility] [place] NAME: [name]    MR#:  [id number]  DATE OF BIRTH:  [birth year]  DATE OF ADMISSION:  10/28/2014  PRIMARY CARE PHYSICIAN: Rinaldo Cloud, MD   REQUESTING/REFERRING PHYSICIAN: Dr. Thomasene Lot  CHIEF COMPLAINT:  Shortness of breath and cough  HISTORY OF PRESENT ILLNESS:  [name]  is a 80 y.o. male with a known history of end-stage renal disease,  on hemodialysis on Monday Wednesday and Friday, insulin requiring diabetes medicine hypertension is presenting to the ED with a chief complaint of shortness of breath and cough. He was treated with a course of azithromycin as an outpatient with no improvement. Patient was found to be hypoxic with a pulse ox of 86% by EMS and he was placed on 3 L of oxygen via nasal cannula which resolved his hypoxia. Patient is reporting midsternal chest pain while coughing. PAST MEDICAL HISTORY:   Past Medical History  Diagnosis Date  . Diabetes mellitus without complication   . Glaucoma    end-stage renal disease, on hemodialysis on Monday, Wednesday and Friday  PAST SURGICAL HISTOIRY:   Past Surgical History  Procedure Laterality Date  . Leg amputation above knee  left    SOCIAL HISTORY:   History  Substance Use Topics  . Smoking status: Former Research scientist (life sciences)  . Smokeless tobacco: Not on file  . Alcohol Use: No   lives alone  FAMILY HISTORY:  History reviewed. No pertinent family history.  DRUG ALLERGIES:  No Known Allergies  REVIEW OF SYSTEMS:  CONSTITUTIONAL: No fever, fatigue or weakness.  EYES: No blurred or double vision.  EARS, NOSE, AND THROAT: No tinnitus or ear pain.  RESPIRATORY: Reporting cough and shortness of breath, denies wheezing or hemoptysis.  CARDIOVASCULAR: No chest pain, orthopnea, edema.  GASTROINTESTINAL: No nausea, vomiting, diarrhea or abdominal pain.  GENITOURINARY: No dysuria, hematuria.  ENDOCRINE: No polyuria, nocturia,  HEMATOLOGY: No anemia,  easy bruising or bleeding SKIN: No rash or lesion. MUSCULOSKELETAL: No joint pain or arthritis.   NEUROLOGIC: No tingling, numbness, weakness.  PSYCHIATRY: No anxiety or depression.   MEDICATIONS AT HOME:   Prior to Admission medications   Medication Sig Start Date End Date Taking? Authorizing Provider  aspirin EC 81 MG tablet Take 81 mg by mouth daily.   Yes Historical Provider, MD  gabapentin (NEURONTIN) 300 MG capsule Take 300 mg by mouth 3 (three) times daily.   Yes Historical Provider, MD  lisinopril (PRINIVIL,ZESTRIL) 10 MG tablet Take 10 mg by mouth daily.   Yes Historical Provider, MD  metoprolol (LOPRESSOR) 50 MG tablet Take 50 mg by mouth 2 (two) times daily.   Yes Historical Provider, MD  multivitamin (RENA-VIT) TABS tablet Take 1 tablet by mouth daily.   Yes Historical Provider, MD  vitamin C (ASCORBIC ACID) 500 MG tablet Take 500 mg by mouth daily.   Yes Historical Provider, MD      VITAL SIGNS:  Blood pressure 157/94, pulse 82, temperature 99.7 F (37.6 C), temperature source Oral, resp. rate 23, height 5\' 7"  (1.702 m), weight 58.968 kg (130 lb), SpO2 94 %.  PHYSICAL EXAMINATION:  GENERAL:  80 y.o.-year-old patient lying in the bed with no acute distress.  EYES: Pupils equal, round, reactive to light and accommodation. No scleral icterus. Patient is legally blind.  HEENT: Head atraumatic, normocephalic. Oropharynx and nasopharynx clear.  NECK:  Supple, no jugular venous distention. No thyroid enlargement, no tenderness.  LUNGS: Coarse breath sounds bilaterally, no wheezing,  rales. Few rales and crepitation on  left and right lung bases. No use of accessory muscles of respiration.  CARDIOVASCULAR: S1, S2 normal. No murmurs, rubs, or gallops.  ABDOMEN: Soft, nontender, nondistended. Bowel sounds present. No organomegaly or mass.  EXTREMITIES: No pedal edema, cyanosis, or clubbing. Left AKA NEUROLOGIC: Cranial nerves II through XII are intact. Muscle strength 5/5 in all  extremities. Sensation intact. Gait not checked.  PSYCHIATRIC: The patient is alert and oriented x 3.  SKIN: No obvious rash, lesion, or ulcer.   LABORATORY PANEL:   CBC  Recent Labs Lab 10/28/14 1655  WBC 6.7  HGB 9.3*  HCT 30.1*  PLT 468*   ------------------------------------------------------------------------------------------------------------------  Chemistries   Recent Labs Lab 10/28/14 1655  NA 141  K 4.0  CL 98*  CO2 30  GLUCOSE 114*  BUN 40*  CREATININE 7.40*  CALCIUM 7.6*  AST 30  ALT 21  ALKPHOS 89  BILITOT 0.5   ------------------------------------------------------------------------------------------------------------------  Cardiac Enzymes No results for input(s): TROPONINI in the last 168 hours. ------------------------------------------------------------------------------------------------------------------  RADIOLOGY:  Dg Chest Port 1 View  10/28/2014   CLINICAL DATA:  Cough and shortness of breath 3 weeks.  EXAM: PORTABLE CHEST - 1 VIEW  COMPARISON:  11/17/2013  FINDINGS: Patient is rotated to the left. Lungs are somewhat hypoinflated demonstrate opacification over the left perihilar region and left base as well as the right lung base. Findings may be due to asymmetric edema versus infection. There is mild elevation of the left hemidiaphragm as cannot exclude a small left pleural effusion. Cardiomediastinal silhouette is within normal. There is calcified plaque over the aortic arch. Remainder of the exam is unchanged.  IMPRESSION: Hypoinflation with opacification in the left perihilar region and left base as well as right base. Findings may be due to asymmetric edema versus infection. Possible small left effusion.   Electronically Signed   By: Marin Olp M.D.   On: 10/28/2014 17:24    EKG:   Orders placed or performed during the hospital encounter of 10/28/14  . ED EKG 12-Lead  . ED EKG 12-Lead  . EKG 12-Lead  . EKG 12-Lead    IMPRESSION  AND PLAN:   [name]  is a 80 y.o. male with a known history of end-stage renal disease,  on hemodialysis on Monday Wednesday and Friday, insulin requiring diabetes medicine hypertension is presenting to the ED with a chief complaint of shortness of breath and cough. He was treated with a course of azithromycin as an outpatient with no improvement. Patient was found to be hypoxic with a pulse ox of 86% by EMS and he was placed on 3 L of oxygen via nasal cannula which resolved his hypoxia. Patient is reporting midsternal chest pain while coughing  1. Acute respiratory distress with hypoxia secondary to healthcare associated pneumonia Patient failed outpatient azithromycin Will provide Zosyn and vancomycin Sputum culture and sensitivity is ordered  2. End-stage renal disease Continue hemodialysis on Monday, Wednesday and Friday Nephrology consult is placed  3. History of glaucoma and patient is legally blind Out of bed with assistance Continue home medications for glaucoma  4. Insulin requiring diabetes mellitus Patient will be on diabetic diet Provide sliding scale insulin  5. History of hypertension Continue home meds Lopressor and lisinopril  DVT prophylaxis with heparin subcutaneous    All the records are reviewed and case discussed with ED provider. Management plans discussed with the patient, family and they are in agreement.  CODE STATUS: Full code, stepdaughter Ms.  [name] is his healthcare power of attorney  TOTAL TIME TAKING CARE OF THIS PATIENT: Reviewing medical records, history and physical, admission orders and coordination of care-45 minutes.    Nicholes Mango M.D on 10/28/2014 at 7:16 PM  Between 7am to 6pm - Pager - [phone]  After 6pm go to [email or web address] - password [email or web address] [facility]  Office  [phone]  CC: Primary care physician; Rinaldo Cloud, MD

## 2014-10-28 NOTE — ED Notes (Signed)
Pt c/o SOB increasing. Tachypnea. Placed on supplemental 02 @ 3L via [place]. Reports some improvement.

## 2014-10-28 NOTE — ED Provider Notes (Signed)
[facility] Emergency Department Provider Note  ____________________________________________  Time seen: Seen upon arrival to the emergency department  I have reviewed the triage vital signs and the nursing notes.   HISTORY  Chief Complaint Shortness of Breath and Cough    HPI [name] is a 80 y.o. male with a history of end-stage renal disease on dialysis who presents today with increased cough and shortness of breath over the past 3 weeks. He has been on several antibiotics, the last being a course of azithromycin. He was found to be hypoxic to the high 80s by EMS today when they were called out to his residence. He was placed on 3 L nasal cannula oxygen which resolved his hypoxia. The patient says he has mild chest pain with cough. However, denies any other pain. Compliant with his dialysis and his last session was this past Friday. Denies any fever at home. Does not use any home oxygen. No history of smoking.   Past Medical History  Diagnosis Date  . Diabetes mellitus without complication   . Glaucoma     There are no active problems to display for this patient.   Past Surgical History  Procedure Laterality Date  . Leg amputation above knee  left    No current outpatient prescriptions on file.  Allergies Review of patient's allergies indicates no known allergies.  History reviewed. No pertinent family history.  Social History History  Substance Use Topics  . Smoking status: Former Research scientist (life sciences)  . Smokeless tobacco: Not on file  . Alcohol Use: No    Review of Systems Constitutional: No fever/chills Eyes: Blind at baseline ENT: No sore throat. Cardiovascular: Denies chest pain. Respiratory: Cough with shortness of breath  Gastrointestinal: No abdominal pain.  No nausea, no vomiting.  No diarrhea.  No constipation. Genitourinary: Negative for dysuria. Musculoskeletal: Negative for back pain. Skin: Negative for rash. Neurological: Negative  for headaches, focal weakness or numbness.  10-point ROS otherwise negative.  ____________________________________________   PHYSICAL EXAM:  VITAL SIGNS: ED Triage Vitals  Enc Vitals Group     BP 10/28/14 1644 145/92 mmHg     Pulse Rate 10/28/14 1644 85     Resp 10/28/14 1644 20     Temp 10/28/14 1644 99.7 F (37.6 C)     Temp Source 10/28/14 1644 Oral     SpO2 10/28/14 1644 96 %     Weight 10/28/14 1644 130 lb (58.968 kg)     Height 10/28/14 1644 5\' 7"  (1.702 m)     Head Cir --      Peak Flow --      Pain Score --      Pain Loc --      Pain Edu? --      Excl. in [place]? --     Constitutional: Alert and oriented. Well appearing and in no acute distress. Eyes: Sclerae corneas bilaterally Head: Atraumatic. Nose: No congestion/rhinnorhea. Mouth/Throat: Mucous membranes are moist.  Oropharynx non-erythematous. Neck: No stridor.   Cardiovascular: Normal rate, regular rhythm. Grossly normal heart sounds.  Good peripheral circulation. Respiratory: Normal respiratory effort.  No retractions. Decreased lung sounds to the left lower lobe. Hypoxic to 86% on room air.  Gastrointestinal: Soft and nontender. No distention. No abdominal bruits. No CVA tenderness. Musculoskeletal: AKA to the left lower extremity. Stump is clean dry and intact.  Neurologic:  Normal speech and language. No gross focal neurologic deficits are appreciated. No gait instability. Skin:  Skin is warm, dry and  intact. No rash noted. Psychiatric: Mood and affect are normal. Speech and behavior are normal.  ____________________________________________   LABS (all labs ordered are listed, but only abnormal results are displayed)  Labs Reviewed  CBC WITH DIFFERENTIAL/PLATELET - Abnormal; Notable for the following:    Hemoglobin 9.3 (*)    HCT 30.1 (*)    MCV 68.6 (*)    MCH 21.3 (*)    MCHC 31.0 (*)    RDW 20.4 (*)    Platelets 468 (*)    All other components within normal limits  CULTURE, BLOOD (ROUTINE X  2)  CULTURE, BLOOD (ROUTINE X 2)  URINE CULTURE  COMPREHENSIVE METABOLIC PANEL  LACTIC ACID, PLASMA  LACTIC ACID, PLASMA  URINALYSIS COMPLETEWITH MICROSCOPIC ([facility] ONLY)   ____________________________________________  EKG  ED ECG REPORT I, Doran Stabler, the attending physician, personally viewed and interpreted this ECG.   Date: 10/28/2014  EKG Time: 1724  Rate: 83  Rhythm: normal sinus rhythm  Axis: Left axis deviation  Intervals:Incomplete right bundle-branch block  ST&T Change: T-wave inversion in 1 and aVL which are unchanged from 11/17/2013. However, new T-wave inversions in 2 as well as V5 and V6. No ST segment elevations or depressions.  ____________________________________________  RADIOLOGY  Hypoinflation with opacification the left perihilar region and left base as well as right base. May be due to asymmetric edema versus infection. I personally reviewed these images. ____________________________________________   PROCEDURES    ____________________________________________   INITIAL IMPRESSION / ASSESSMENT AND PLAN / ED COURSE  Pertinent labs & imaging results that were available during my care of the patient were reviewed by me and considered in my medical decision making (see chart for details).  ----------------------------------------- 5:46 PM on 10/28/2014 -----------------------------------------  Patient resting probably at this time. Oxygen saturations are 97% with nasal cannula oxygen. Discussed the findings with the family as well as the patient. Will require HCAp protocol antibiotics because of patient being on dialysis as well as recent antibiotic use as an outpatient.  Signed out to Dr. Margaretmary Eddy.  Patient is compliant with dialysis. Unlikely to be edema. Also does not have rales on exam, edema or very high blood pressure expected to cause CHF. ____________________________________________   FINAL CLINICAL IMPRESSION(S) / ED  DIAGNOSES  Acute pneumonia with hypoxia. Initial visit.    Orbie Pyo, MD 10/28/14 [phone]

## 2014-10-29 LAB — CBC
HEMATOCRIT: 29.8 % — AB (ref 40.0–52.0)
Hemoglobin: 9.1 g/dL — ABNORMAL LOW (ref 13.0–18.0)
MCH: 20.9 pg — AB (ref 26.0–34.0)
MCHC: 30.4 g/dL — AB (ref 32.0–36.0)
MCV: 68.9 fL — AB (ref 80.0–100.0)
PLATELETS: 466 10*3/uL — AB (ref 150–440)
RBC: 4.33 MIL/uL — AB (ref 4.40–5.90)
RDW: 20.3 % — AB (ref 11.5–14.5)
WBC: 6.6 10*3/uL (ref 3.8–10.6)

## 2014-10-29 LAB — HEMOGLOBIN A1C: Hgb A1c MFr Bld: 6.2 % — ABNORMAL HIGH (ref 4.0–6.0)

## 2014-10-29 LAB — RENAL FUNCTION PANEL
ALBUMIN: 2.9 g/dL — AB (ref 3.5–5.0)
ANION GAP: 15 (ref 5–15)
BUN: 49 mg/dL — AB (ref 6–20)
CO2: 26 mmol/L (ref 22–32)
Calcium: 7.4 mg/dL — ABNORMAL LOW (ref 8.9–10.3)
Chloride: 97 mmol/L — ABNORMAL LOW (ref 101–111)
Creatinine, Ser: 8.32 mg/dL — ABNORMAL HIGH (ref 0.61–1.24)
GFR calc Af Amer: 6 mL/min — ABNORMAL LOW (ref >60)
GFR, EST NON AFRICAN AMERICAN: 5 mL/min — AB (ref >60)
Glucose, Bld: 187 mg/dL — ABNORMAL HIGH (ref 65–99)
PHOSPHORUS: 4.9 mg/dL — AB (ref 2.5–4.6)
Potassium: 4 mmol/L (ref 3.5–5.1)
Sodium: 138 mmol/L (ref 135–145)

## 2014-10-29 LAB — BASIC METABOLIC PANEL
Anion gap: 13 (ref 5–15)
BUN: 45 mg/dL — AB (ref 6–20)
CALCIUM: 7.5 mg/dL — AB (ref 8.9–10.3)
CO2: 29 mmol/L (ref 22–32)
Chloride: 99 mmol/L — ABNORMAL LOW (ref 101–111)
Creatinine, Ser: 7.86 mg/dL — ABNORMAL HIGH (ref 0.61–1.24)
GFR calc Af Amer: 7 mL/min — ABNORMAL LOW (ref >60)
GFR calc non Af Amer: 6 mL/min — ABNORMAL LOW (ref >60)
GLUCOSE: 152 mg/dL — AB (ref 65–99)
Potassium: 3.7 mmol/L (ref 3.5–5.1)
Sodium: 141 mmol/L (ref 135–145)

## 2014-10-29 LAB — GLUCOSE, CAPILLARY
Glucose-Capillary: 116 mg/dL — ABNORMAL HIGH (ref 65–99)
Glucose-Capillary: 117 mg/dL — ABNORMAL HIGH (ref 65–99)
Glucose-Capillary: 174 mg/dL — ABNORMAL HIGH (ref 65–99)
Glucose-Capillary: 99 mg/dL (ref 65–99)
Glucose-Capillary: 162 mg/dL — ABNORMAL HIGH (ref 65–99)

## 2014-10-29 MED ORDER — EPOETIN ALFA 10000 UNIT/ML IJ SOLN
10000.0000 [IU] | Freq: Once | INTRAMUSCULAR | Status: AC
  Administered 2014-10-29: 10000 [IU] via INTRAVENOUS

## 2014-10-29 MED ORDER — GUAIFENESIN-CODEINE 100-10 MG/5ML PO SOLN
10.0000 mL | ORAL | Status: DC | PRN
  Administered 2014-10-29 – 2014-10-30 (×3): 10 mL via ORAL
  Filled 2014-10-29 (×3): qty 10

## 2014-10-29 MED ORDER — VANCOMYCIN HCL 500 MG IV SOLR
500.0000 mg | INTRAVENOUS | Status: DC | PRN
  Filled 2014-10-29: qty 500

## 2014-10-29 MED ORDER — VANCOMYCIN HCL 500 MG IV SOLR
500.0000 mg | INTRAVENOUS | Status: DC
  Administered 2014-10-29: 500 mg via INTRAVENOUS
  Filled 2014-10-29 (×3): qty 500

## 2014-10-29 NOTE — Care Management (Signed)
Patient presents from home with worsening shortenss of breath and hypoxia.  He is followed by [facility].  He was treated for shortness of breath and cough as an outpatient with oral antibiotics without improvement.  His presenting room air sats were 86%.  Admitted with pneumonia.  02 requirements are acute.  [facility] aware of admission

## 2014-10-29 NOTE — Progress Notes (Signed)
Spoke with Dr Marcille Blanco regarding pt having crackles in throat/lungs that were not present upon admission.  Pt not in distress.  Pt having nausea/vomiting, but emesis appears to be white/thick/frothy and resembles mucus.  Pt O2 sats are 98% on 3LNC.  Pt states he does not feel short of breath.  Administered medication per MAR.  MD states to continue to monitor at this time.   Lynnda Shields, RN

## 2014-10-29 NOTE — Progress Notes (Signed)
Patient alert and oriented x4, no complaints at this time. vss at this time. Patient NSR on telemetry. Will continue to assess. Patient back from dialysis. [name]

## 2014-10-29 NOTE — Care Management Note (Signed)
Patient is active at [place]. MWF schedule.  I will send updated records to clinic at discharge. [name] [phone]

## 2014-10-29 NOTE — Progress Notes (Signed)
[facility]  ROUNDING NOTE   Subjective:   Admitted overnight for hypoglycemia, hypoxia, shortness of breath. Due for hemodialysis today.   Objective:  Vital signs in last 24 hours:  Temp:  [98.5 F (36.9 C)-99.7 F (37.6 C)] 98.5 F (36.9 C) (08/01 0800) Pulse Rate:  [76-90] 87 (08/01 0800) Resp:  [16-27] 20 (08/01 0800) BP: (145-179)/(64-94) 168/67 mmHg (08/01 0800) SpO2:  [55 %-100 %] 96 % (08/01 0800) Weight:  [58.968 kg (130 lb)] 58.968 kg (130 lb) (07/31 1644)  Weight change:  Filed Weights   10/28/14 1644  Weight: 58.968 kg (130 lb)    Intake/Output:     Intake/Output this shift:  Total I/O In: 120 [P.O.:120] Out: -   Physical Exam: General: NAD  Head: Normocephalic, atraumatic. Moist oral mucosal membranes  Eyes: +opaque left eye  Neck: Supple, trachea midline  Lungs:  Clear to auscultation  Heart: Regular rate and rhythm  Abdomen:  Soft, nontender,   Extremities: no peripheral edema. Left AKA  Neurologic: Nonfocal, moving all four extremities  Skin: No lesions  Access: Right arm AVF    Basic Metabolic Panel:  Recent Labs Lab 10/28/14 1655 10/29/14 0356  NA 141 141  K 4.0 3.7  CL 98* 99*  CO2 30 29  GLUCOSE 114* 152*  BUN 40* 45*  CREATININE 7.40* 7.86*  CALCIUM 7.6* 7.5*    Liver Function Tests:  Recent Labs Lab 10/28/14 1655  AST 30  ALT 21  ALKPHOS 89  BILITOT 0.5  PROT 7.0  ALBUMIN 3.0*   No results for input(s): LIPASE, AMYLASE in the last 168 hours. No results for input(s): AMMONIA in the last 168 hours.  CBC:  Recent Labs Lab 10/28/14 1655 10/29/14 0356  WBC 6.7 6.6  NEUTROABS 4.5  --   HGB 9.3* 9.1*  HCT 30.1* 29.8*  MCV 68.6* 68.9*  PLT 468* 466*    Cardiac Enzymes: No results for input(s): CKTOTAL, CKMB, CKMBINDEX, TROPONINI in the last 168 hours.  BNP: Invalid input(s): POCBNP  CBG:  Recent Labs Lab 10/28/14 2119 10/29/14 0613 10/29/14 0714  GLUCAP 89 116* 117*     Microbiology: Results for orders placed or performed during the hospital encounter of 10/28/14  Blood Culture (routine x 2)     Status: None (Preliminary result)   Collection Time: 10/28/14  5:30 PM  Result Value Ref Range Status   Specimen Description BLOOD LEFT ASSIST CONTROL  Final   Special Requests   Final    BOTTLES DRAWN AEROBIC AND ANAEROBIC  AER 4CC ANA 2CC   Culture NO GROWTH < 24 HOURS  Final   Report Status PENDING  Incomplete  Blood Culture (routine x 2)     Status: None (Preliminary result)   Collection Time: 10/28/14  5:43 PM  Result Value Ref Range Status   Specimen Description BLOOD LEFT ARM  Final   Special Requests   Final    BOTTLES DRAWN AEROBIC AND ANAEROBIC  AER 4CC ANA 2CC   Culture NO GROWTH < 24 HOURS  Final   Report Status PENDING  Incomplete    Coagulation Studies: No results for input(s): LABPROT, INR in the last 72 hours.  Urinalysis: No results for input(s): COLORURINE, LABSPEC, PHURINE, GLUCOSEU, HGBUR, BILIRUBINUR, KETONESUR, PROTEINUR, UROBILINOGEN, NITRITE, LEUKOCYTESUR in the last 72 hours.  Invalid input(s): APPERANCEUR    Imaging: Dg Chest Port 1 View  10/28/2014   CLINICAL DATA:  Cough and shortness of breath 3 weeks.  EXAM: PORTABLE CHEST -  1 VIEW  COMPARISON:  11/17/2013  FINDINGS: Patient is rotated to the left. Lungs are somewhat hypoinflated demonstrate opacification over the left perihilar region and left base as well as the right lung base. Findings may be due to asymmetric edema versus infection. There is mild elevation of the left hemidiaphragm as cannot exclude a small left pleural effusion. Cardiomediastinal silhouette is within normal. There is calcified plaque over the aortic arch. Remainder of the exam is unchanged.  IMPRESSION: Hypoinflation with opacification in the left perihilar region and left base as well as right base. Findings may be due to asymmetric edema versus infection. Possible small left effusion.    Electronically Signed   By: Marin Olp M.D.   On: 10/28/2014 17:24     Medications:     . aspirin EC  81 mg Oral Daily  . docusate sodium  100 mg Oral BID  . gabapentin  300 mg Oral TID  . heparin  5,000 Units Subcutaneous 3 times per day  . insulin aspart  0-9 Units Subcutaneous TID WC  . lisinopril  10 mg Oral Daily  . metoprolol  50 mg Oral BID  . multivitamin  1 tablet Oral Daily  . piperacillin-tazobactam (ZOSYN)  IV  3.375 g Intravenous Q12H  . sodium chloride  3 mL Intravenous Q12H  . vancomycin  500 mg Intravenous Q M,W,F-HD  . vitamin C  500 mg Oral Daily   sodium chloride, acetaminophen **OR** acetaminophen, albuterol, ondansetron **OR** ondansetron (ZOFRAN) IV, sodium chloride, zolpidem  Assessment/ Plan:  [name] is a 80 y.o. black male with ESRD, on HD , Diabetes melllitus. PVD left AKA,   [facility]//[address]//MWF  1. ESRD - Hemodialysis for later today. AVF. Continue MWF schedule.     2. Hypertension: elevated. Lisinopril and metoprolol  3. 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   4. SHPTH: Currently not on binders.   5. Pneumonia: vancomycin and zosyn. Failed outpatient azithromycin.    LOS: [place], [place] 8/1/20169:40 AM

## 2014-10-29 NOTE — Progress Notes (Signed)
[place] [facility] [place] NAME: [name]    MR#:  [id number]  DATE OF BIRTH:  1934/11/05  SUBJECTIVE: Seen today, admitted last night secondary to acute hypoxic respiratory failure from pneumonia. Patient failed outpatient therapy. Patient right now is on 3 and half liters of oxygen and sats are 92%. Afebrile.   CHIEF COMPLAINT:   Chief Complaint  Patient presents with  . Shortness of Breath  . Cough    REVIEW OF SYSTEMS:    Review of Systems  Constitutional: Negative for fever and chills.  HENT: Negative for hearing loss.   Eyes: Negative for blurred vision, double vision and photophobia.  Respiratory: Positive for cough and shortness of breath. Negative for hemoptysis.   Cardiovascular: Negative for palpitations, orthopnea and leg swelling.  Gastrointestinal: Negative for vomiting, abdominal pain and diarrhea.  Genitourinary: Negative for dysuria and urgency.  Musculoskeletal: Negative for myalgias and neck pain.  Skin: Negative for rash.  Neurological: Negative for dizziness, focal weakness, seizures, weakness and headaches.  Psychiatric/Behavioral: Negative for memory loss. The patient does not have insomnia.     Nutrition:  Tolerating Diet: Tolerating PT:      DRUG ALLERGIES:  No Known Allergies  VITALS:  Blood pressure 155/65, pulse 84, temperature 98.5 F (36.9 C), temperature source Oral, resp. rate 20, height 5\' 7"  (1.702 m), weight 58.968 kg (130 lb), SpO2 92 %.  PHYSICAL EXAMINATION:   Physical Exam  GENERAL:  80 y.o.-year-old patient lying in the bed with no acute distress.  EYES: Pupils equal, round, reactive to light and accommodation. No scleral icterus. Extraocular muscles intact.  HEENT: Head atraumatic, normocephalic. Oropharynx and nasopharynx clear.  NECK:  Supple, no jugular venous distention. No thyroid enlargement, no tenderness.  LUNGS: . Coarse breath sounds bilaterally. No  wheezing. CARDIOVASCULAR: S1, S2 normal. No murmurs, rubs, or gallops.  ABDOMEN: Soft, nontender, nondistended. Bowel sounds present. No organomegaly or mass.  EXTREMITIES: No pedal edema, cyanosis, or clubbing.  NEUROLOGIC: Cranial nerves II through XII are intact. Muscle strength 5/5 in all extremities. Sensation intact. Gait not checked.  PSYCHIATRIC: The patient is alert and oriented x 3.  SKIN: No obvious rash, lesion, or ulcer.    LABORATORY PANEL:   CBC  Recent Labs Lab 10/29/14 0356  WBC 6.6  HGB 9.1*  HCT 29.8*  PLT 466*   ------------------------------------------------------------------------------------------------------------------  Chemistries   Recent Labs Lab 10/28/14 1655 10/29/14 0356  NA 141 141  K 4.0 3.7  CL 98* 99*  CO2 30 29  GLUCOSE 114* 152*  BUN 40* 45*  CREATININE 7.40* 7.86*  CALCIUM 7.6* 7.5*  AST 30  --   ALT 21  --   ALKPHOS 89  --   BILITOT 0.5  --    ------------------------------------------------------------------------------------------------------------------  Cardiac Enzymes No results for input(s): TROPONINI in the last 168 hours. ------------------------------------------------------------------------------------------------------------------  RADIOLOGY:  Dg Chest Port 1 View  10/28/2014   CLINICAL DATA:  Cough and shortness of breath 3 weeks.  EXAM: PORTABLE CHEST - 1 VIEW  COMPARISON:  11/17/2013  FINDINGS: Patient is rotated to the left. Lungs are somewhat hypoinflated demonstrate opacification over the left perihilar region and left base as well as the right lung base. Findings may be due to asymmetric edema versus infection. There is mild elevation of the left hemidiaphragm as cannot exclude a small left pleural effusion. Cardiomediastinal silhouette is within normal. There is calcified plaque over the aortic arch. Remainder of the exam is unchanged.  IMPRESSION: Hypoinflation with opacification in the left perihilar  region and left base as well as right base. Findings may be due to asymmetric edema versus infection. Possible small left effusion.   Electronically Signed   By: Marin Olp M.D.   On: 10/28/2014 17:24     ASSESSMENT AND PLAN:   Active Problems:   Healthcare-associated pneumonia  #1 healthcare associated pneumonia: Continue vancomycin and Zosyn. Patient failed outpatient therapy with the IV azithromycin. #2 acute hypoxic respiratory failure secondary to healthcare associated pneumonia: Continue oxygen and see the clinical improvement.   #3 history of ESRD: On hemodialysis Monday Wednesday Friday, nephrology consulted.   4. insulin-requiring type 2 diabetes mellitus continue sliding scale, diabetic diet. Not on any medications for it at home. #5 hypertension: Controlled, continue metoprolol, lisinopril.  Disposition; pending clinical improvement.    All the records are reviewed and case discussed with Care Management/Social Workerr. Management plans discussed with the patient, family and they are in agreement.  CODE STATUS: full  TOTAL TIME TAKING CARE OF THIS PATIENT: 35  minutes.   POSSIBLE D/C IN 1-2 DAYS, DEPENDING ON CLINICAL CONDITION.   Epifanio Lesches M.D on 10/29/2014 at 11:21 AM  Between 7am to 6pm - Pager - [phone]  After 6pm go to [email or web address] - password [email or web address] [facility]  Office  [phone]  CC: Primary care physician; Rinaldo Cloud, MD

## 2014-10-30 LAB — GLUCOSE, CAPILLARY
GLUCOSE-CAPILLARY: 156 mg/dL — AB (ref 65–99)
GLUCOSE-CAPILLARY: 92 mg/dL (ref 65–99)
GLUCOSE-CAPILLARY: 132 mg/dL — AB (ref 65–99)
Glucose-Capillary: 146 mg/dL — ABNORMAL HIGH (ref 65–99)

## 2014-10-30 LAB — MRSA PCR SCREENING: MRSA BY PCR: NEGATIVE

## 2014-10-30 LAB — HEPATITIS B SURFACE ANTIGEN: Hepatitis B Surface Ag: NEGATIVE

## 2014-10-30 LAB — HEPATITIS B SURFACE ANTIBODY, QUANTITATIVE: HEPATITIS B-POST: 70.5 m[IU]/mL

## 2014-10-30 MED ORDER — NEPRO/CARBSTEADY PO LIQD
237.0000 mL | Freq: Three times a day (TID) | ORAL | Status: DC
Start: 2014-10-30 — End: 2014-10-31
  Administered 2014-10-30 – 2014-10-31 (×2): 237 mL via ORAL

## 2014-10-30 MED ORDER — SENNOSIDES-DOCUSATE SODIUM 8.6-50 MG PO TABS
1.0000 | ORAL_TABLET | Freq: Two times a day (BID) | ORAL | Status: DC
  Administered 2014-10-30 (×2): 1 via ORAL
  Filled 2014-10-30 (×2): qty 1

## 2014-10-30 MED ORDER — IPRATROPIUM-ALBUTEROL 0.5-2.5 (3) MG/3ML IN SOLN
3.0000 mL | Freq: Four times a day (QID) | RESPIRATORY_TRACT | Status: DC
  Administered 2014-10-30 – 2014-10-31 (×4): 3 mL via RESPIRATORY_TRACT
  Filled 2014-10-30 (×4): qty 3

## 2014-10-30 NOTE — Care Management (Signed)
Patient says he does not use his leg prosthesis.  "I just don't use it... Never have used it much and declines any interest in it now.  He locomotes at baseline transferring and using his wheelchair.   Will discuss this further with his [facility] care team

## 2014-10-30 NOTE — Progress Notes (Signed)
Initial Nutrition Assessment   INTERVENTION:   Meals and Snacks: Cater to patient preferences; spoke with RN Velna Hatchet pt receiving assistance with feeding Medical Food Supplement Therapy: will recommend Nepro Shake po TID, each supplement provides 425 kcal and 19 grams protein. RN aware that dining services is sending one as a snack today.    NUTRITION DIAGNOSIS:   Inadequate oral intake related to acute illness as evidenced by meal completion < 50% today.  GOAL:   Patient will meet greater than or equal to 90% of their needs  MONITOR:    (Energy Intake, Electrolyte and renal Profile, Anthropometrics, Digestive System)  REASON FOR ASSESSMENT:   Consult Poor PO  ASSESSMENT:   Pt admitted with health care acquired pna. Pt legally blind and with left AKA. Pt s/p HD yesterday. with a h/o ESRD.  Past Medical History  Diagnosis Date  . Diabetes mellitus without complication   . Glaucoma      Diet Order:  Diet renal/carb modified with fluid restriction Diet-HS Snack?: Nothing; Room service appropriate?: Yes; Fluid consistency:: Thin    Current Nutrition: Pt ate 25% of fish and rice for lunch, pt reports some vomitting after eating lunch today.  Pt not c/o abdominal discomfort on visit after lunch. RN Brandi aware. RN also reports pt ate about 50% of breakfast this am.    Food/Nutrition-Related History: Pt reports eating 3 meals per day PTA with a good appetite. Recorded po intake 90% of meals yesterday.   Medications: vitamin C, senokot, novolog  Electrolyte/Renal Profile and Glucose Profile:   Recent Labs Lab 10/28/14 1655 10/29/14 0356 10/29/14 1214  NA 141 141 138  K 4.0 3.7 4.0  CL 98* 99* 97*  CO2 30 29 26   BUN 40* 45* 49*  CREATININE 7.40* 7.86* 8.32*  CALCIUM 7.6* 7.5* 7.4*  PHOS  --   --  4.9*  GLUCOSE 114* 152* 187*   Protein Profile:  Recent Labs Lab 10/28/14 1655 10/29/14 1214  ALBUMIN 3.0* 2.9*    Gastrointestinal Profile: WDL Last BM:   10/28/2014   Skin:  Reviewed, no issues   Nutrition-Focused Physical Exam Findings: Nutrition-Focused physical exam completed. Findings are WDL for fat depletion, severe muscle depletion of right lower extremity, and no edema.    Weight Change: Pt reports UBW of 130lbs.  Height:   Ht Readings from Last 1 Encounters:  10/28/14 5\' 7"  (1.702 m)    Weight:   Wt Readings from Last 1 Encounters:  10/29/14 129 lb 3 oz (58.6 kg)    Adjusted Ideal Body Weight:   60.5kg  BMI:  Body mass index is 20.23 kg/(m^2).  Estimated Nutritional Needs:   Kcal:  1888-2202kcals, BEE: 1310kcals, TEE: (IF 1.2-1.4)(AF 1.2) using adjusted IBW of 60.5kg  Protein:  76-95g protein (1.2-1.5g/kg) using adjusted IBW of 60.5kg  Fluid:  UOP+1024mL  EDUCATION NEEDS:   Education needs no appropriate at this time   [place], RD, LDN Pager [phone]

## 2014-10-30 NOTE — Progress Notes (Signed)
ANTIBIOTIC CONSULT NOTE - INITIAL  Pharmacy Consult for Vancomycin/Zosyn Indication: HCAP  No Known Allergies  Patient Measurements: Height: 5\' 7"  (170.2 cm) Weight: 129 lb 3 oz (58.6 kg) IBW/kg (Calculated) : 66.1 Adjusted Body Weight: 59  Vital Signs: Temp: 98.4 F (36.9 C) (08/02 1139) Temp Source: Oral (08/02 1139) BP: 118/57 mmHg (08/02 1139) Pulse Rate: 73 (08/02 1139) Intake/Output from previous day: 08/01 0701 - 08/02 0700 In: 390 [P.O.:240; IV Piggyback:150] Out: 1500  Intake/Output from this shift:    Labs:  Recent Labs  10/28/14 1655 10/29/14 0356 10/29/14 1214  WBC 6.7 6.6  --   HGB 9.3* 9.1*  --   PLT 468* 466*  --   CREATININE 7.40* 7.86* 8.32*   Estimated Creatinine Clearance: 6.2 mL/min (by C-G formula based on Cr of 8.32). No results for input(s): VANCOTROUGH, VANCOPEAK, VANCORANDOM, GENTTROUGH, GENTPEAK, GENTRANDOM, TOBRATROUGH, TOBRAPEAK, TOBRARND, AMIKACINPEAK, AMIKACINTROU, AMIKACIN in the last 72 hours.   Microbiology: Recent Results (from the past 720 hour(s))  Blood Culture (routine x 2)     Status: None (Preliminary result)   Collection Time: 10/28/14  5:30 PM  Result Value Ref Range Status   Specimen Description BLOOD LEFT ASSIST CONTROL  Final   Special Requests   Final    BOTTLES DRAWN AEROBIC AND ANAEROBIC  AER 4CC ANA 2CC   Culture NO GROWTH 2 DAYS  Final   Report Status PENDING  Incomplete  Blood Culture (routine x 2)     Status: None (Preliminary result)   Collection Time: 10/28/14  5:43 PM  Result Value Ref Range Status   Specimen Description BLOOD LEFT ARM  Final   Special Requests   Final    BOTTLES DRAWN AEROBIC AND ANAEROBIC  AER 4CC ANA 2CC   Culture NO GROWTH 2 DAYS  Final   Report Status PENDING  Incomplete  MRSA PCR Screening     Status: None   Collection Time: 10/30/14  5:17 AM  Result Value Ref Range Status   MRSA by PCR NEGATIVE NEGATIVE Final    Comment:        The GeneXpert MRSA Assay (FDA approved for  NASAL specimens only), is one component of a comprehensive MRSA colonization surveillance program. It is not intended to diagnose MRSA infection nor to guide or monitor treatment for MRSA infections.     Medical History: Past Medical History  Diagnosis Date  . Diabetes mellitus without complication   . Glaucoma     Medications:  Prescriptions prior to admission  Medication Sig Dispense Refill Last Dose  . aspirin EC 81 MG tablet Take 81 mg by mouth daily.   10/28/2014 at Unknown time  . gabapentin (NEURONTIN) 300 MG capsule Take 300 mg by mouth 3 (three) times daily.   10/28/2014 at Unknown time  . lisinopril (PRINIVIL,ZESTRIL) 10 MG tablet Take 10 mg by mouth daily.   10/28/2014 at Unknown time  . metoprolol (LOPRESSOR) 50 MG tablet Take 50 mg by mouth 2 (two) times daily.   10/28/2014 at Unknown time  . multivitamin (RENA-VIT) TABS tablet Take 1 tablet by mouth daily.   10/28/2014 at Unknown time  . vitamin C (ASCORBIC ACID) 500 MG tablet Take 500 mg by mouth daily.   10/28/2014 at Unknown time   Assessment: Pharmacy consulted to dose vancomycin for HCAP in this 80 year old male with ESRD on HD MWF.  Goal of Therapy:  Vanc trough 15 - 25 mcg/mL   Plan:  Continue Zosyn 3.375 gm IV  Q12H EI.   Vancomcyin 1500 mg IV X 1 given as loading dose in ED on 7/31. Vancomycin 500 mg IV ordered to be given during last 30 minutes of each HD session. Trough scheduled for 8/5 prior to 3rd planned HD schedule, will continue to follow HD schedule and adjust if needed.  Pharmacy to follow per consult  Rexene Edison, PharmD Clinical Pharmacist  10/30/2014,1:49 PM

## 2014-10-30 NOTE — Care Management Important Message (Signed)
Important Message  Patient Details  Name: [name] MRN: [id number] Date of Birth: 1934-08-02   Medicare Important Message Given:  Yes-second notification given    Darius Bump Allmond 10/30/2014, 9:53 AM

## 2014-10-30 NOTE — Clinical Documentation Improvement (Signed)
Please specify diagnosis related to below supporting information, if appropriate.   Possible Clinical Conditions? _______Secondary Hyperparathyroidism  _______Other Condition__________________ _______Cannot Clinically Determine   Supporting Information: Per 10/30/14 MD progress note = SHPTH: Currently not on binders. Phos 4.9.    Per 10/29/14 MD progress note = SHPTH: Currently not on binders.    Thank You, Serena Colonel ,RN Clinical Documentation Specialist:  [place] [facility]

## 2014-10-30 NOTE — Progress Notes (Signed)
Patient weaned down to 2L of 02, at 93%. Patient had 120 mL of nepro at this time. Wilnette Kales

## 2014-10-30 NOTE — Progress Notes (Signed)
[place] at [place] NAME: [name]    MR#:  [id number]  DATE OF BIRTH:  08-Jun-1934  SUBJECTIVE: cough still present.but better than yesterday.  CHIEF COMPLAINT:   Chief Complaint  Patient presents with  . Shortness of Breath  . Cough    REVIEW OF SYSTEMS:    Review of Systems  Constitutional: Negative for fever and chills.  HENT: Negative for hearing loss.   Eyes: Negative for blurred vision, double vision and photophobia.  Respiratory: Positive for cough and shortness of breath. Negative for hemoptysis.   Cardiovascular: Negative for palpitations, orthopnea and leg swelling.  Gastrointestinal: Negative for vomiting, abdominal pain and diarrhea.  Genitourinary: Negative for dysuria and urgency.  Musculoskeletal: Negative for myalgias and neck pain.  Skin: Negative for rash.  Neurological: Negative for dizziness, focal weakness, seizures, weakness and headaches.  Psychiatric/Behavioral: Negative for memory loss. The patient does not have insomnia.     Nutrition:  Tolerating Diet: Tolerating PT:      DRUG ALLERGIES:  No Known Allergies  VITALS:  Blood pressure 118/57, pulse 73, temperature 98.4 F (36.9 C), temperature source Oral, resp. rate 16, height 5\' 7"  (1.702 m), weight 58.6 kg (129 lb 3 oz), SpO2 95 %.  PHYSICAL EXAMINATION:   Physical Exam  GENERAL:  80 y.o.-year-old patient lying in the bed with no acute distress.  EYES: Pupils equal, round, reactive to light and accommodation. No scleral icterus. Extraocular muscles intact.  HEENT: Head atraumatic, normocephalic. Oropharynx and nasopharynx clear.  NECK:  Supple, no jugular venous distention. No thyroid enlargement, no tenderness.  LUNGS: . Coarse breath sounds bilaterally. No wheezing. CARDIOVASCULAR: S1, S2 normal. No murmurs, rubs, or gallops.  ABDOMEN: Soft, nontender, nondistended. Bowel sounds present. No organomegaly or mass.  EXTREMITIES: No  pedal edema, cyanosis, or clubbing.  NEUROLOGIC: Cranial nerves II through XII are intact. Muscle strength 5/5 in all extremities. Sensation intact. Gait not checked.  PSYCHIATRIC: The patient is alert and oriented x 3.  SKIN: No obvious rash, lesion, or ulcer.    LABORATORY PANEL:   CBC  Recent Labs Lab 10/29/14 0356  WBC 6.6  HGB 9.1*  HCT 29.8*  PLT 466*   ------------------------------------------------------------------------------------------------------------------  Chemistries   Recent Labs Lab 10/28/14 1655  10/29/14 1214  NA 141  < > 138  K 4.0  < > 4.0  CL 98*  < > 97*  CO2 30  < > 26  GLUCOSE 114*  < > 187*  BUN 40*  < > 49*  CREATININE 7.40*  < > 8.32*  CALCIUM 7.6*  < > 7.4*  AST 30  --   --   ALT 21  --   --   ALKPHOS 89  --   --   BILITOT 0.5  --   --   < > = values in this interval not displayed. ------------------------------------------------------------------------------------------------------------------  Cardiac Enzymes No results for input(s): TROPONINI in the last 168 hours. ------------------------------------------------------------------------------------------------------------------  RADIOLOGY:  Dg Chest Port 1 View  10/28/2014   CLINICAL DATA:  Cough and shortness of breath 3 weeks.  EXAM: PORTABLE CHEST - 1 VIEW  COMPARISON:  11/17/2013  FINDINGS: Patient is rotated to the left. Lungs are somewhat hypoinflated demonstrate opacification over the left perihilar region and left base as well as the right lung base. Findings may be due to asymmetric edema versus infection. There is mild elevation of the left hemidiaphragm as cannot exclude a small left pleural effusion.  Cardiomediastinal silhouette is within normal. There is calcified plaque over the aortic arch. Remainder of the exam is unchanged.  IMPRESSION: Hypoinflation with opacification in the left perihilar region and left base as well as right base. Findings may be due to asymmetric  edema versus infection. Possible small left effusion.   Electronically Signed   By: Marin Olp M.D.   On: 10/28/2014 17:24     ASSESSMENT AND PLAN:   Active Problems:   Healthcare-associated pneumonia  #1 healthcare associated pneumonia: Continue vancomycin and Zosyn. Patient failed outpatient therapy with the IV azithromycin. #2 acute hypoxic respiratory failure secondary to healthcare associated pneumonia: improving slowly on 3litres o2.,wean down to keep sats 92  #3 history of ESRD: On hemodialysis Monday Wednesday Friday, nephrology following   4. insulin-requiring type 2 diabetes mellitus continue sliding scale, diabetic diet. Not on any medications for it at home. #5 hypertension: Controlled, continue metoprolol, lisinopril.  Disposition; pending clinical improvement.  dietary consult today  For his nutritional requiremens,as per RN not eating well today.    All the records are reviewed and case discussed with Care Management/Social Workerr. Management plans discussed with the patient, family and they are in agreement.  CODE STATUS: full  TOTAL TIME TAKING CARE OF THIS PATIENT: 35  minutes.   POSSIBLE D/C IN 1-2 DAYS, DEPENDING ON CLINICAL CONDITION.   Epifanio Lesches M.D on 10/30/2014 at 1:58 PM  Between 7am to 6pm - Pager - [phone]  After 6pm go to [email or web address] - password [email or web address] [facility]  Office  [phone]  CC: Primary care physician; Rinaldo Cloud, MD

## 2014-10-30 NOTE — Progress Notes (Signed)
Patient alert and oriented x4, no complaints at this time. vss at this time. Patient NSR on telemetry. Will continue to assess. Patient having poor PO intake, Dr. Vianne Bulls notified and dietary consult ordered. [name]

## 2014-10-30 NOTE — Progress Notes (Signed)
[facility]  ROUNDING NOTE   Subjective:   Hemodialysis yesterday. Tolerated treatment well. Uf of  1.5 litres States his cough is getting better. On 2 L [place] Tmax 101  Objective:  Vital signs in last 24 hours:  Temp:  [98.2 F (36.8 C)-101 F (38.3 C)] 99 F (37.2 C) (08/02 0605) Pulse Rate:  [77-87] 83 (08/02 0501) Resp:  [17-29] 17 (08/02 0501) BP: (154-172)/(62-77) 154/62 mmHg (08/02 0501) SpO2:  [91 %-98 %] 91 % (08/02 0501) Weight:  [58.6 kg (129 lb 3 oz)-60.3 kg (132 lb 15 oz)] 58.6 kg (129 lb 3 oz) (08/01 1610)  Weight change: 1.332 kg (2 lb 15 oz) Filed Weights   10/29/14 1215 10/29/14 1515 10/29/14 1610  Weight: 60.3 kg (132 lb 15 oz) 59 kg (130 lb 1.1 oz) 58.6 kg (129 lb 3 oz)    Intake/Output: I/O last 3 completed shifts: In: 390 [P.O.:240; IV Piggyback:150] Out: 1500 [Other:1500]   Intake/Output this shift:     Physical Exam: General: NAD  Head: Normocephalic, atraumatic. Moist oral mucosal membranes  Eyes: +opaque left eye  Neck: Supple, trachea midline  Lungs:  Clear to auscultation. 2L [place] O2  Heart: Regular rate and rhythm  Abdomen:  Soft, nontender,   Extremities: no peripheral edema. Left AKA  Neurologic: Nonfocal, moving all four extremities  Skin: No lesions  Access: Right arm AVF    Basic Metabolic Panel:  Recent Labs Lab 10/28/14 1655 10/29/14 0356 10/29/14 1214  NA 141 141 138  K 4.0 3.7 4.0  CL 98* 99* 97*  CO2 30 29 26   GLUCOSE 114* 152* 187*  BUN 40* 45* 49*  CREATININE 7.40* 7.86* 8.32*  CALCIUM 7.6* 7.5* 7.4*  PHOS  --   --  4.9*    Liver Function Tests:  Recent Labs Lab 10/28/14 1655 10/29/14 1214  AST 30  --   ALT 21  --   ALKPHOS 89  --   BILITOT 0.5  --   PROT 7.0  --   ALBUMIN 3.0* 2.9*   No results for input(s): LIPASE, AMYLASE in the last 168 hours. No results for input(s): AMMONIA in the last 168 hours.  CBC:  Recent Labs Lab 10/28/14 1655 10/29/14 0356  WBC 6.7 6.6  NEUTROABS 4.5   --   HGB 9.3* 9.1*  HCT 30.1* 29.8*  MCV 68.6* 68.9*  PLT 468* 466*    Cardiac Enzymes: No results for input(s): CKTOTAL, CKMB, CKMBINDEX, TROPONINI in the last 168 hours.  BNP: Invalid input(s): POCBNP  CBG:  Recent Labs Lab 10/29/14 0714 10/29/14 1111 10/29/14 1617 10/29/14 2027 10/30/14 0725  GLUCAP 117* 174* 99 162* 156*    Microbiology: Results for orders placed or performed during the hospital encounter of 10/28/14  Blood Culture (routine x 2)     Status: None (Preliminary result)   Collection Time: 10/28/14  5:30 PM  Result Value Ref Range Status   Specimen Description BLOOD LEFT ASSIST CONTROL  Final   Special Requests   Final    BOTTLES DRAWN AEROBIC AND ANAEROBIC  AER 4CC ANA 2CC   Culture NO GROWTH 2 DAYS  Final   Report Status PENDING  Incomplete  Blood Culture (routine x 2)     Status: None (Preliminary result)   Collection Time: 10/28/14  5:43 PM  Result Value Ref Range Status   Specimen Description BLOOD LEFT ARM  Final   Special Requests   Final    BOTTLES DRAWN AEROBIC AND ANAEROBIC  AER 4CC ANA 2CC   Culture NO GROWTH 2 DAYS  Final   Report Status PENDING  Incomplete  MRSA PCR Screening     Status: None   Collection Time: 10/30/14  5:17 AM  Result Value Ref Range Status   MRSA by PCR NEGATIVE NEGATIVE Final    Comment:        The GeneXpert MRSA Assay (FDA approved for NASAL specimens only), is one component of a comprehensive MRSA colonization surveillance program. It is not intended to diagnose MRSA infection nor to guide or monitor treatment for MRSA infections.     Coagulation Studies: No results for input(s): LABPROT, INR in the last 72 hours.  Urinalysis: No results for input(s): COLORURINE, LABSPEC, PHURINE, GLUCOSEU, HGBUR, BILIRUBINUR, KETONESUR, PROTEINUR, UROBILINOGEN, NITRITE, LEUKOCYTESUR in the last 72 hours.  Invalid input(s): APPERANCEUR    Imaging: Dg Chest Port 1 View  10/28/2014   CLINICAL DATA:  Cough and  shortness of breath 3 weeks.  EXAM: PORTABLE CHEST - 1 VIEW  COMPARISON:  11/17/2013  FINDINGS: Patient is rotated to the left. Lungs are somewhat hypoinflated demonstrate opacification over the left perihilar region and left base as well as the right lung base. Findings may be due to asymmetric edema versus infection. There is mild elevation of the left hemidiaphragm as cannot exclude a small left pleural effusion. Cardiomediastinal silhouette is within normal. There is calcified plaque over the aortic arch. Remainder of the exam is unchanged.  IMPRESSION: Hypoinflation with opacification in the left perihilar region and left base as well as right base. Findings may be due to asymmetric edema versus infection. Possible small left effusion.   Electronically Signed   By: Marin Olp M.D.   On: 10/28/2014 17:24     Medications:     . aspirin EC  81 mg Oral Daily  . docusate sodium  100 mg Oral BID  . gabapentin  300 mg Oral TID  . heparin  5,000 Units Subcutaneous 3 times per day  . insulin aspart  0-9 Units Subcutaneous TID WC  . lisinopril  10 mg Oral Daily  . metoprolol  50 mg Oral BID  . multivitamin  1 tablet Oral Daily  . piperacillin-tazobactam (ZOSYN)  IV  3.375 g Intravenous Q12H  . sodium chloride  3 mL Intravenous Q12H  . vancomycin  500 mg Intravenous Q M,W,F-HD  . vitamin C  500 mg Oral Daily   sodium chloride, acetaminophen **OR** acetaminophen, albuterol, guaiFENesin-codeine, ondansetron **OR** ondansetron (ZOFRAN) IV, sodium chloride, zolpidem  Assessment/ Plan:  [name] is a 80 y.o. black male with ESRD, on HD , Diabetes melllitus. PVD left AKA,   [facility]//[address]//MWF  1. ESRD - Hemodialysis tolerated well. AVF. Continue MWF schedule.   Next treatment for tomorrow.   2. Hypertension: elevated. Lisinopril and metoprolol  3. 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   4. SHPTH: Currently not on binders. Phos 4.9  5. Pneumonia: vancomycin and  zosyn. Failed outpatient azithromycin.  - continue supportive care.    LOS: 2 [name] 8/2/20169:52 AM

## 2014-10-31 LAB — GLUCOSE, CAPILLARY: Glucose-Capillary: 145 mg/dL — ABNORMAL HIGH (ref 65–99)

## 2014-10-31 MED ORDER — EPOETIN ALFA 10000 UNIT/ML IJ SOLN: 10000.0000 [IU] | Freq: Once | INTRAMUSCULAR | Status: DC

## 2014-10-31 MED ORDER — SACCHAROMYCES BOULARDII 250 MG PO CAPS: 250.0000 mg | ORAL_CAPSULE | Freq: Two times a day (BID) | ORAL | Status: DC

## 2014-10-31 MED ORDER — ALBUTEROL SULFATE HFA 108 (90 BASE) MCG/ACT IN AERS: 2.0000 | INHALATION_SPRAY | Freq: Four times a day (QID) | RESPIRATORY_TRACT | Status: DC | PRN

## 2014-10-31 MED ORDER — CEFDINIR 300 MG PO CAPS: 300.0000 mg | ORAL_CAPSULE | Freq: Two times a day (BID) | ORAL | Status: DC

## 2014-10-31 MED ORDER — NEPRO/CARBSTEADY PO LIQD: 237.0000 mL | Freq: Three times a day (TID) | ORAL | Status: DC

## 2014-10-31 NOTE — Discharge Summary (Addendum)
[facility] [place] NAME: [name]    MR#:  [id number]  DATE OF BIRTH:  [birth year]  DATE OF ADMISSION:  10/28/2014 ADMITTING PHYSICIAN: Nicholes Mango, MD  DATE OF DISCHARGE: 10/31/2014  PRIMARY CARE PHYSICIAN: Rinaldo Cloud, MD    ADMISSION DIAGNOSIS:  Hypoxia [R09.02] HCAP (healthcare-associated pneumonia) [J18.9]  DISCHARGE DIAGNOSIS:  Active Problems:   Healthcare-associated pneumonia   SECONDARY DIAGNOSIS:   Past Medical History  Diagnosis Date  . Diabetes mellitus without complication   . Glaucoma     HOSPITAL COURSE:   #1 healthcare associated pneumonia: Improved with vancomycin and Zosyn. Patient failed outpatient therapy with the  Azithromycin. Discharging home with 5 more days of Omnicef with probiotics #2 acute hypoxic respiratory failure secondary to healthcare associated pneumonia: improving slowly on 2 litres o2., Desaturated to 80% while weaning off. We will discharge him with 2 L of oxygen via nasal cannula to maintain sats greater than 91% #3 history of ESRD: Continued hemodialysis on Monday Wednesday Friday during hospital course. Patient will get outpatient hemodialysis today after discharge, scheduled by case manager ms.Nan 4. insulin-requiring type 2 diabetes mellitus continue sliding scale, diabetic diet. Not on any medications for it at home. #5 hypertension: Controlled, continue metoprolol, lisinopril. #6 secondary hyperparathyroidism-secondary to end-stage renal disease, nephrology is considering to start him on phospho binders as an outpatient  DISCHARGE CONDITIONS:   Satisfactory  CONSULTS OBTAINED:  Treatment Team:  Nicholes Mango, MD Lavonia Dana, MD   PROCEDURES hemodialysis  DRUG ALLERGIES:  No Known Allergies  DISCHARGE MEDICATIONS:   Current Discharge Medication List    START taking these medications   Details  albuterol (PROVENTIL HFA;VENTOLIN HFA) 108 (90 BASE) MCG/ACT  inhaler Inhale 2 puffs into the lungs every 6 (six) hours as needed for wheezing or shortness of breath. Qty: 1 Inhaler, Refills: 2    cefdinir (OMNICEF) 300 MG capsule Take 1 capsule (300 mg total) by mouth 2 (two) times daily. Qty: 10 capsule, Refills: 0    Nutritional Supplements (FEEDING SUPPLEMENT, NEPRO CARB STEADY,) LIQD Take 237 mLs by mouth 3 (three) times daily with meals. Qty: 90 Can, Refills: 0    saccharomyces boulardii (FLORASTOR) 250 MG capsule Take 1 capsule (250 mg total) by mouth 2 (two) times daily. Qty: 30 capsule, Refills: 0      CONTINUE these medications which have NOT CHANGED   Details  aspirin EC 81 MG tablet Take 81 mg by mouth daily.    gabapentin (NEURONTIN) 300 MG capsule Take 300 mg by mouth 3 (three) times daily.    lisinopril (PRINIVIL,ZESTRIL) 10 MG tablet Take 10 mg by mouth daily.    metoprolol (LOPRESSOR) 50 MG tablet Take 50 mg by mouth 2 (two) times daily.    multivitamin (RENA-VIT) TABS tablet Take 1 tablet by mouth daily.    vitamin C (ASCORBIC ACID) 500 MG tablet Take 500 mg by mouth daily.         DISCHARGE INSTRUCTIONS:   Continue hemodialysis on Monday, Wednesday and Friday  [facility]  Continue 2 L of oxygen via nasal cannula  [facility]   DIET:  Diabetic diet, renal  DISCHARGE CONDITION:  Fair  ACTIVITY:  Activity as tolerated  OXYGEN:  Home Oxygen: Yes.     Oxygen Delivery: 2 liters/min via Patient connected to nasal cannula oxygen  DISCHARGE LOCATION:  home , has caregivers at home  If you experience worsening of your admission symptoms, develop shortness of breath, life threatening emergency, suicidal or homicidal thoughts you must seek medical attention immediately by calling 911 or calling your MD immediately  if symptoms less severe.  You Must read complete instructions/literature along with all the possible adverse reactions/side  effects for all the Medicines you take and that have been prescribed to you. Take any new Medicines after you have completely understood and accpet all the possible adverse reactions/side effects.   Please note  You were cared for by a hospitalist during your hospital stay. If you have any questions about your discharge medications or the care you received while you were in the hospital after you are discharged, you can call the unit and asked to speak with the hospitalist on call if the hospitalist that took care of you is not available. Once you are discharged, your primary care physician will handle any further medical issues. Please note that NO REFILLS for any discharge medications will be authorized once you are discharged, as it is imperative that you return to your primary care physician (or establish a relationship with a primary care physician if you do not have one) for your aftercare needs so that they can reassess your need for medications and monitor your lab values.     Today  Chief Complaint  Patient presents with  . Shortness of Breath  . Cough   Resting comfortably. Denies any shortness of breath. Cough is much better. Wants to go home  ROS:  CONSTITUTIONAL: Denies fevers, chills. Denies any fatigue, weakness.  EYES: Denies blurry vision, double vision, eye pain. EARS, NOSE, THROAT: Denies tinnitus, ear pain, hearing loss. RESPIRATORY: Denies cough, wheeze, shortness of breath.  CARDIOVASCULAR: Denies chest pain, palpitations, edema.  GASTROINTESTINAL: Denies nausea, vomiting, diarrhea, abdominal pain. Denies bright red blood per rectum. GENITOURINARY: Denies dysuria, hematuria. ENDOCRINE: Denies nocturia or thyroid problems. HEMATOLOGIC AND LYMPHATIC: Denies easy bruising or bleeding. SKIN: Denies rash or lesion. MUSCULOSKELETAL: Denies pain in neck, back, shoulder, knees, hips or arthritic symptoms.  NEUROLOGIC: Denies paralysis, paresthesias.  PSYCHIATRIC: Denies  anxiety or depressive symptoms.   VITAL SIGNS:  Blood pressure 140/57, pulse 82, temperature 98.2 F (36.8 C), temperature source Oral, resp. rate 19, height 5\' 7"  (1.702 m), weight 58.6 kg (129 lb 3 oz), SpO2 91 %.  I/O:   Intake/Output Summary (Last 24 hours) at 10/31/14 1105 Last data filed at 10/30/14 1600  Gross per 24 hour  Intake    120 ml  Output      0 ml  Net    120 ml    PHYSICAL EXAMINATION:  GENERAL:  80 y.o.-year-old patient lying in the bed with no acute distress.  EYES: Pupils equal, round, reactive to light and accommodation. No scleral icterus. Legally blind normocephalic. Oropharynx and nasopharynx clear.  NECK:  Supple, no jugular venous distention. No thyroid enlargement, no tenderness.  LUNGS: Normal breath sounds bilaterally, no wheezing, rales,rhonchi or crepitation. No use of accessory muscles of respiration.  CARDIOVASCULAR: S1, S2 normal. No murmurs, rubs, or gallops.  ABDOMEN: Soft, non-tender, non-distended. Bowel sounds present. No organomegaly or mass.  EXTREMITIES: No pedal edema, cyanosis, or clubbing. AKA of the lower extremity NEUROLOGIC: Cranial nerves II through XII are intact. Muscle strength 5/5 in  extremities. Sensation intact. Gait not checked.  PSYCHIATRIC: The patient is alert and oriented x 3.  SKIN: No obvious rash, lesion, or ulcer.   DATA REVIEW:   CBC  Recent Labs Lab  10/29/14 0356  WBC 6.6  HGB 9.1*  HCT 29.8*  PLT 466*    Chemistries   Recent Labs Lab 10/28/14 1655  10/29/14 1214  NA 141  < > 138  K 4.0  < > 4.0  CL 98*  < > 97*  CO2 30  < > 26  GLUCOSE 114*  < > 187*  BUN 40*  < > 49*  CREATININE 7.40*  < > 8.32*  CALCIUM 7.6*  < > 7.4*  AST 30  --   --   ALT 21  --   --   ALKPHOS 89  --   --   BILITOT 0.5  --   --   < > = values in this interval not displayed.  Cardiac Enzymes No results for input(s): TROPONINI in the last 168 hours.  Microbiology Results  Results for orders placed or performed  during the hospital encounter of 10/28/14  Blood Culture (routine x 2)     Status: None (Preliminary result)   Collection Time: 10/28/14  5:30 PM  Result Value Ref Range Status   Specimen Description BLOOD LEFT ASSIST CONTROL  Final   Special Requests   Final    BOTTLES DRAWN AEROBIC AND ANAEROBIC  AER 4CC ANA 2CC   Culture NO GROWTH 2 DAYS  Final   Report Status PENDING  Incomplete  Blood Culture (routine x 2)     Status: None (Preliminary result)   Collection Time: 10/28/14  5:43 PM  Result Value Ref Range Status   Specimen Description BLOOD LEFT ARM  Final   Special Requests   Final    BOTTLES DRAWN AEROBIC AND ANAEROBIC  AER 4CC ANA 2CC   Culture NO GROWTH 2 DAYS  Final   Report Status PENDING  Incomplete  MRSA PCR Screening     Status: None   Collection Time: 10/30/14  5:17 AM  Result Value Ref Range Status   MRSA by PCR NEGATIVE NEGATIVE Final    Comment:        The GeneXpert MRSA Assay (FDA approved for NASAL specimens only), is one component of a comprehensive MRSA colonization surveillance program. It is not intended to diagnose MRSA infection nor to guide or monitor treatment for MRSA infections.     RADIOLOGY:  Dg Chest Port 1 View  10/28/2014   CLINICAL DATA:  Cough and shortness of breath 3 weeks.  EXAM: PORTABLE CHEST - 1 VIEW  COMPARISON:  11/17/2013  FINDINGS: Patient is rotated to the left. Lungs are somewhat hypoinflated demonstrate opacification over the left perihilar region and left base as well as the right lung base. Findings may be due to asymmetric edema versus infection. There is mild elevation of the left hemidiaphragm as cannot exclude a small left pleural effusion. Cardiomediastinal silhouette is within normal. There is calcified plaque over the aortic arch. Remainder of the exam is unchanged.  IMPRESSION: Hypoinflation with opacification in the left perihilar region and left base as well as right base. Findings may be due to asymmetric edema versus  infection. Possible small left effusion.   Electronically Signed   By: Marin Olp M.D.   On: 10/28/2014 17:24    EKG:   Orders placed or performed during the hospital encounter of 10/28/14  . ED EKG 12-Lead  . ED EKG 12-Lead  . EKG 12-Lead  . EKG 12-Lead      Management plans discussed with the patient, CM they are  in agreement.  CODE STATUS:  Code Status Orders        Start     Ordered   10/28/14 2120  Full code   Continuous     10/28/14 2119    Advance Directive Documentation        Most Recent Value   Type of Advance Directive  Out of facility DNR (pink MOST or yellow form)   Pre-existing out of facility DNR order (yellow form or pink MOST form)  Yellow form placed in chart (order not valid for inpatient use)   "MOST" Form in Place?        TOTAL TIME TAKING CARE OF THIS PATIENT: Reviewing medical records, discharge plan with the discharge orders, discharge summary and coordination of care-45  minutes.    @[facility] @  on 10/31/2014 at 11:05 AM  Between 7am to 6pm - Pager - [phone]  After 6pm go to [email or web address] - password [email or web address] [facility]  Office  [phone]  CC: Primary care physician; Rinaldo Cloud, MD

## 2014-10-31 NOTE — Progress Notes (Signed)
[facility]  ROUNDING NOTE   Subjective:   Hemodialysis for later today. Continues to have cough. Afebrile.   Objective:  Vital signs in last 24 hours:  Temp:  [98.2 F (36.8 C)-98.4 F (36.9 C)] 98.2 F (36.8 C) (08/02 1931) Pulse Rate:  [73-82] 82 (08/03 1041) Resp:  [16-23] 19 (08/03 0438) BP: (118-148)/(57-61) 140/57 mmHg (08/03 1041) SpO2:  [80 %-95 %] 91 % (08/03 1041)  Weight change:  Filed Weights   10/29/14 1215 10/29/14 1515 10/29/14 1610  Weight: 60.3 kg (132 lb 15 oz) 59 kg (130 lb 1.1 oz) 58.6 kg (129 lb 3 oz)    Intake/Output: I/O last 3 completed shifts: In: 170 [P.O.:120; IV Piggyback:50] Out: 0    Intake/Output this shift:     Physical Exam: General: NAD  Head: Normocephalic, atraumatic. Moist oral mucosal membranes  Eyes: +opaque left eye  Neck: Supple, trachea midline  Lungs:  Clear to auscultation. 2L [place] O2  Heart: Regular rate and rhythm  Abdomen:  Soft, nontender,   Extremities: no peripheral edema. Left AKA  Neurologic: Nonfocal, moving all four extremities  Skin: No lesions  Access: Right arm AVF    Basic Metabolic Panel:  Recent Labs Lab 10/28/14 1655 10/29/14 0356 10/29/14 1214  NA 141 141 138  K 4.0 3.7 4.0  CL 98* 99* 97*  CO2 30 29 26   GLUCOSE 114* 152* 187*  BUN 40* 45* 49*  CREATININE 7.40* 7.86* 8.32*  CALCIUM 7.6* 7.5* 7.4*  PHOS  --   --  4.9*    Liver Function Tests:  Recent Labs Lab 10/28/14 1655 10/29/14 1214  AST 30  --   ALT 21  --   ALKPHOS 89  --   BILITOT 0.5  --   PROT 7.0  --   ALBUMIN 3.0* 2.9*   No results for input(s): LIPASE, AMYLASE in the last 168 hours. No results for input(s): AMMONIA in the last 168 hours.  CBC:  Recent Labs Lab 10/28/14 1655 10/29/14 0356  WBC 6.7 6.6  NEUTROABS 4.5  --   HGB 9.3* 9.1*  HCT 30.1* 29.8*  MCV 68.6* 68.9*  PLT 468* 466*    Cardiac Enzymes: No results for input(s): CKTOTAL, CKMB, CKMBINDEX, TROPONINI in the last 168  hours.  BNP: Invalid input(s): POCBNP  CBG:  Recent Labs Lab 10/30/14 0725 10/30/14 1140 10/30/14 1627 10/30/14 2017 10/31/14 0734  GLUCAP 156* 92 146* 132* 145*    Microbiology: Results for orders placed or performed during the hospital encounter of 10/28/14  Blood Culture (routine x 2)     Status: None (Preliminary result)   Collection Time: 10/28/14  5:30 PM  Result Value Ref Range Status   Specimen Description BLOOD LEFT ASSIST CONTROL  Final   Special Requests   Final    BOTTLES DRAWN AEROBIC AND ANAEROBIC  AER 4CC ANA 2CC   Culture NO GROWTH 2 DAYS  Final   Report Status PENDING  Incomplete  Blood Culture (routine x 2)     Status: None (Preliminary result)   Collection Time: 10/28/14  5:43 PM  Result Value Ref Range Status   Specimen Description BLOOD LEFT ARM  Final   Special Requests   Final    BOTTLES DRAWN AEROBIC AND ANAEROBIC  AER 4CC ANA 2CC   Culture NO GROWTH 2 DAYS  Final   Report Status PENDING  Incomplete  MRSA PCR Screening     Status: None   Collection Time: 10/30/14  5:17 AM  Result Value Ref Range Status   MRSA by PCR NEGATIVE NEGATIVE Final    Comment:        The GeneXpert MRSA Assay (FDA approved for NASAL specimens only), is one component of a comprehensive MRSA colonization surveillance program. It is not intended to diagnose MRSA infection nor to guide or monitor treatment for MRSA infections.     Coagulation Studies: No results for input(s): LABPROT, INR in the last 72 hours.  Urinalysis: No results for input(s): COLORURINE, LABSPEC, PHURINE, GLUCOSEU, HGBUR, BILIRUBINUR, KETONESUR, PROTEINUR, UROBILINOGEN, NITRITE, LEUKOCYTESUR in the last 72 hours.  Invalid input(s): APPERANCEUR    Imaging: No results found.   Medications:     . aspirin EC  81 mg Oral Daily  . feeding supplement (NEPRO CARB STEADY)  237 mL Oral TID WC  . gabapentin  300 mg Oral TID  . heparin  5,000 Units Subcutaneous 3 times per day  . insulin  aspart  0-9 Units Subcutaneous TID WC  . ipratropium-albuterol  3 mL Nebulization Q6H  . lisinopril  10 mg Oral Daily  . metoprolol  50 mg Oral BID  . multivitamin  1 tablet Oral Daily  . piperacillin-tazobactam (ZOSYN)  IV  3.375 g Intravenous Q12H  . senna-docusate  1 tablet Oral BID  . sodium chloride  3 mL Intravenous Q12H  . vancomycin  500 mg Intravenous Q M,W,F-HD  . vitamin C  500 mg Oral Daily   sodium chloride, acetaminophen **OR** acetaminophen, albuterol, guaiFENesin-codeine, ondansetron **OR** ondansetron (ZOFRAN) IV, sodium chloride, zolpidem  Assessment/ Plan:  [name] is a 80 y.o. black male with ESRD, on HD , Diabetes melllitus. PVD left AKA,   [facility]//[address]//MWF  1. ESRD - Hemodialysis tolerated well. AVF. Continue MWF schedule.   Next treatment for later today. Orders prepared. 3K bath.   2. Hypertension: better control. Lisinopril and metoprolol  3. 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   4. SHPTH: Currently not on binders. Phos 4.9  5. Pneumonia: vancomycin and zosyn. Failed outpatient azithromycin.  - continue supportive care.    LOS: [place], [place] 8/3/201611:12 AM

## 2014-10-31 NOTE — Progress Notes (Signed)
   10/31/14 1035 10/31/14 1037  Oxygen Therapy  SpO2 (!) 80 % 95 %  O2 Device Room Air (at rest) Nasal Cannula (at rest)  O2 Flow Rate (L/min) --  2 L/min

## 2014-10-31 NOTE — Care Management (Addendum)
Patient is for discharge home today after dialysis.  Today is patient's regular dialysis day.  Contacted dialysis coordinator.  She has obtained patient appointment at 12 noon at his dialysis center.  Patient is going required new home 02. [facility] contract [facility].  [facility] will provide the home 02. [facility] will transport patient to his dialysis center.  Faxed discharge summary to [facility].  Asked [facility] to provide 2 portable tanks to be transported to the dialysis center so patient does not run out.

## 2014-10-31 NOTE — Plan of Care (Signed)
Problem: Consults Goal: Skin Care Protocol Initiated - if Braden Score 18 or less If consults are not indicated, leave blank or document N/A  Outcome: Progressing Increase caloric intake.

## 2014-10-31 NOTE — Care Management Note (Signed)
Patient is able to discharge and go to home clinic for dialysis today.  Spoke with FA Kathlee Nations and Gabriel Cirri RN at [facility] and they will be able to treat the patient today if he can be there no later than 11:20 [facility]  Dialysis Liaison  [phone]

## 2014-10-31 NOTE — Discharge Instructions (Signed)
Continue hemodialysis on Monday, Wednesday and Friday [facility]-up with [facility] days Continue 2 L of oxygen via nasal cannula Physical therapy [facility] Renal and diabetic diet Activity as tolerated

## 2014-10-31 NOTE — Progress Notes (Signed)
Discharging home, going to outpatient dialysis as set up by care management. Patient given discharge teaching and paperwork regarding medications, diet, follow-up appointments and activity. Patient understanding verbalized. No complaints at this time. IV and telemetry discontinued. Skin assessment as previously charted and vitals are stable. Patient being discharged to home. No further needs by Care Management or MD. Patient being transported to dialysis by [facility].  Toniann Ket

## 2014-10-31 NOTE — Care Management Note (Signed)
UPDATE ON OUT PATIENT CENTER DIALYSIS: Center can take patient as late as 12 noon.   [facility] Dialysis Liaison [phone]

## 2014-11-02 LAB — CULTURE, BLOOD (ROUTINE X 2)
CULTURE: NO GROWTH
CULTURE: NO GROWTH

## 2014-11-08 ENCOUNTER — Other Ambulatory Visit: Payer: Self-pay | Admitting: Family Medicine

## 2014-11-08 DIAGNOSIS — R509 Fever, unspecified: Secondary | ICD-10-CM

## 2014-11-08 DIAGNOSIS — R05 Cough: Secondary | ICD-10-CM

## 2014-11-08 DIAGNOSIS — R059 Cough, unspecified: Secondary | ICD-10-CM

## 2014-11-10 ENCOUNTER — Emergency Department: Payer: Medicare (Managed Care)

## 2014-11-10 ENCOUNTER — Inpatient Hospital Stay
Admission: EM | Admit: 2014-11-10 | Discharge: 2014-11-14 | DRG: 193 | Disposition: A | Discharge status: Home / Self Care | Payer: Medicare (Managed Care) | Attending: Internal Medicine | Admitting: Internal Medicine

## 2014-11-10 ENCOUNTER — Encounter: Payer: Self-pay | Admitting: *Deleted

## 2014-11-10 DIAGNOSIS — Z7982 Long term (current) use of aspirin: Secondary | ICD-10-CM

## 2014-11-10 DIAGNOSIS — J90 Pleural effusion, not elsewhere classified: Secondary | ICD-10-CM | POA: Diagnosis present

## 2014-11-10 DIAGNOSIS — Z833 Family history of diabetes mellitus: Secondary | ICD-10-CM | POA: Diagnosis not present

## 2014-11-10 DIAGNOSIS — H548 Legal blindness, as defined in USA: Secondary | ICD-10-CM | POA: Diagnosis present

## 2014-11-10 DIAGNOSIS — Z89612 Acquired absence of left leg above knee: Secondary | ICD-10-CM

## 2014-11-10 DIAGNOSIS — I739 Peripheral vascular disease, unspecified: Secondary | ICD-10-CM | POA: Diagnosis present

## 2014-11-10 DIAGNOSIS — Z87891 Personal history of nicotine dependence: Secondary | ICD-10-CM

## 2014-11-10 DIAGNOSIS — D472 Monoclonal gammopathy: Secondary | ICD-10-CM | POA: Diagnosis present

## 2014-11-10 DIAGNOSIS — Z66 Do not resuscitate: Secondary | ICD-10-CM | POA: Diagnosis present

## 2014-11-10 DIAGNOSIS — R778 Other specified abnormalities of plasma proteins: Secondary | ICD-10-CM | POA: Diagnosis present

## 2014-11-10 DIAGNOSIS — I248 Other forms of acute ischemic heart disease: Secondary | ICD-10-CM | POA: Diagnosis present

## 2014-11-10 DIAGNOSIS — I120 Hypertensive chronic kidney disease with stage 5 chronic kidney disease or end stage renal disease: Secondary | ICD-10-CM | POA: Diagnosis present

## 2014-11-10 DIAGNOSIS — J189 Pneumonia, unspecified organism: Secondary | ICD-10-CM | POA: Diagnosis not present

## 2014-11-10 DIAGNOSIS — Z79899 Other long term (current) drug therapy: Secondary | ICD-10-CM | POA: Diagnosis not present

## 2014-11-10 DIAGNOSIS — E1122 Type 2 diabetes mellitus with diabetic chronic kidney disease: Secondary | ICD-10-CM | POA: Diagnosis present

## 2014-11-10 DIAGNOSIS — N2581 Secondary hyperparathyroidism of renal origin: Secondary | ICD-10-CM | POA: Diagnosis present

## 2014-11-10 DIAGNOSIS — R7989 Other specified abnormal findings of blood chemistry: Secondary | ICD-10-CM | POA: Diagnosis present

## 2014-11-10 DIAGNOSIS — Z992 Dependence on renal dialysis: Secondary | ICD-10-CM

## 2014-11-10 DIAGNOSIS — Y95 Nosocomial condition: Secondary | ICD-10-CM | POA: Diagnosis present

## 2014-11-10 DIAGNOSIS — E8770 Fluid overload, unspecified: Secondary | ICD-10-CM | POA: Diagnosis not present

## 2014-11-10 DIAGNOSIS — R0902 Hypoxemia: Secondary | ICD-10-CM | POA: Diagnosis present

## 2014-11-10 DIAGNOSIS — H409 Unspecified glaucoma: Secondary | ICD-10-CM | POA: Diagnosis present

## 2014-11-10 DIAGNOSIS — K219 Gastro-esophageal reflux disease without esophagitis: Secondary | ICD-10-CM | POA: Diagnosis present

## 2014-11-10 DIAGNOSIS — N186 End stage renal disease: Secondary | ICD-10-CM | POA: Diagnosis present

## 2014-11-10 DIAGNOSIS — D638 Anemia in other chronic diseases classified elsewhere: Secondary | ICD-10-CM | POA: Diagnosis present

## 2014-11-10 DIAGNOSIS — R0602 Shortness of breath: Secondary | ICD-10-CM

## 2014-11-10 HISTORY — DX: Cyst of kidney, acquired: N28.1

## 2014-11-10 HISTORY — DX: Peripheral vascular disease, unspecified: I73.9

## 2014-11-10 HISTORY — DX: Monoclonal gammopathy: D47.2

## 2014-11-10 HISTORY — DX: Chronic kidney disease, unspecified: N18.9

## 2014-11-10 HISTORY — DX: Anemia in other chronic diseases classified elsewhere: D63.8

## 2014-11-10 HISTORY — DX: Essential (primary) hypertension: I10

## 2014-11-10 LAB — GLUCOSE, CAPILLARY: GLUCOSE-CAPILLARY: 171 mg/dL — AB (ref 65–99)

## 2014-11-10 LAB — CBC WITH DIFFERENTIAL/PLATELET
BASOS ABS: 0 10*3/uL (ref 0–0.1)
Basophils Relative: 1 %
Eosinophils Absolute: 0.1 10*3/uL (ref 0–0.7)
Eosinophils Relative: 2 %
HEMATOCRIT: 31.4 % — AB (ref 40.0–52.0)
Hemoglobin: 10 g/dL — ABNORMAL LOW (ref 13.0–18.0)
LYMPHS PCT: 19 %
Lymphs Abs: 1 10*3/uL (ref 1.0–3.6)
MCH: 22.8 pg — ABNORMAL LOW (ref 26.0–34.0)
MCHC: 31.8 g/dL — AB (ref 32.0–36.0)
MCV: 71.7 fL — AB (ref 80.0–100.0)
MONO ABS: 0.7 10*3/uL (ref 0.2–1.0)
MONOS PCT: 14 %
NEUTROS ABS: 3.5 10*3/uL (ref 1.4–6.5)
NEUTROS PCT: 64 %
Platelets: 329 10*3/uL (ref 150–440)
RBC: 4.37 MIL/uL — AB (ref 4.40–5.90)
RDW: 23.4 % — ABNORMAL HIGH (ref 11.5–14.5)
WBC: 5.3 10*3/uL (ref 3.8–10.6)

## 2014-11-10 LAB — BASIC METABOLIC PANEL
Anion gap: 14 (ref 5–15)
BUN: 23 mg/dL — ABNORMAL HIGH (ref 6–20)
CO2: 30 mmol/L (ref 22–32)
Calcium: 8 mg/dL — ABNORMAL LOW (ref 8.9–10.3)
Chloride: 98 mmol/L — ABNORMAL LOW (ref 101–111)
Creatinine, Ser: 5.17 mg/dL — ABNORMAL HIGH (ref 0.61–1.24)
GFR, EST AFRICAN AMERICAN: 11 mL/min — AB (ref >60)
GFR, EST NON AFRICAN AMERICAN: 10 mL/min — AB (ref >60)
GLUCOSE: 108 mg/dL — AB (ref 65–99)
Potassium: 3.4 mmol/L — ABNORMAL LOW (ref 3.5–5.1)
Sodium: 142 mmol/L (ref 135–145)

## 2014-11-10 LAB — BRAIN NATRIURETIC PEPTIDE

## 2014-11-10 LAB — TROPONIN I
TROPONIN I: 0.28 ng/mL — AB (ref <0.031)
TROPONIN I: 0.27 ng/mL — AB (ref <0.031)

## 2014-11-10 MED ORDER — SEVELAMER CARBONATE 800 MG PO TABS
800.0000 mg | ORAL_TABLET | Freq: Three times a day (TID) | ORAL | Status: DC
  Administered 2014-11-11 – 2014-11-14 (×8): 800 mg via ORAL
  Filled 2014-11-10 (×9): qty 1

## 2014-11-10 MED ORDER — PIPERACILLIN-TAZOBACTAM 3.375 G IVPB
3.3750 g | Freq: Two times a day (BID) | INTRAVENOUS | Status: DC
  Administered 2014-11-11 – 2014-11-14 (×8): 3.375 g via INTRAVENOUS
  Filled 2014-11-10 (×10): qty 50

## 2014-11-10 MED ORDER — INSULIN ASPART 100 UNIT/ML ~~LOC~~ SOLN
0.0000 [IU] | Freq: Three times a day (TID) | SUBCUTANEOUS | Status: DC
  Administered 2014-11-11 – 2014-11-13 (×2): 1 [IU] via SUBCUTANEOUS
  Administered 2014-11-13: 2 [IU] via SUBCUTANEOUS
  Administered 2014-11-14: 3 [IU] via SUBCUTANEOUS
  Filled 2014-11-10: qty 3
  Filled 2014-11-10: qty 2
  Filled 2014-11-10 (×2): qty 1
  Filled 2014-11-10: qty 2

## 2014-11-10 MED ORDER — SODIUM CHLORIDE 0.9 % IV SOLN
250.0000 mg | Freq: Once | INTRAVENOUS | Status: AC
  Administered 2014-11-10: 250 mg via INTRAVENOUS
  Filled 2014-11-10: qty 250

## 2014-11-10 MED ORDER — SODIUM CHLORIDE 0.9 % IJ SOLN: 3.0000 mL | INTRAMUSCULAR | Status: DC | PRN

## 2014-11-10 MED ORDER — METOPROLOL TARTRATE 50 MG PO TABS
50.0000 mg | ORAL_TABLET | Freq: Two times a day (BID) | ORAL | Status: DC
  Administered 2014-11-11 – 2014-11-14 (×7): 50 mg via ORAL
  Filled 2014-11-10 (×7): qty 1

## 2014-11-10 MED ORDER — INSULIN ASPART 100 UNIT/ML ~~LOC~~ SOLN
0.0000 [IU] | Freq: Every day | SUBCUTANEOUS | Status: DC
  Administered 2014-11-11: 2 [IU] via SUBCUTANEOUS
  Filled 2014-11-10: qty 2

## 2014-11-10 MED ORDER — PIPERACILLIN-TAZOBACTAM 3.375 G IVPB 30 MIN
3.3750 g | Freq: Three times a day (TID) | INTRAVENOUS | Status: DC
  Filled 2014-11-10 (×3): qty 50

## 2014-11-10 MED ORDER — IPRATROPIUM-ALBUTEROL 0.5-2.5 (3) MG/3ML IN SOLN
3.0000 mL | Freq: Once | RESPIRATORY_TRACT | Status: AC
  Administered 2014-11-10: 3 mL via RESPIRATORY_TRACT
  Filled 2014-11-10: qty 3

## 2014-11-10 MED ORDER — LISINOPRIL 10 MG PO TABS
10.0000 mg | ORAL_TABLET | Freq: Every day | ORAL | Status: DC
  Administered 2014-11-11 – 2014-11-14 (×4): 10 mg via ORAL
  Filled 2014-11-10 (×4): qty 1

## 2014-11-10 MED ORDER — FUROSEMIDE 10 MG/ML IJ SOLN
60.0000 mg | Freq: Once | INTRAMUSCULAR | Status: AC
  Administered 2014-11-10: 60 mg via INTRAVENOUS
  Filled 2014-11-10: qty 8

## 2014-11-10 MED ORDER — SODIUM CHLORIDE 0.9 % IV SOLN: 250.0000 mL | INTRAVENOUS | Status: DC | PRN

## 2014-11-10 MED ORDER — VANCOMYCIN HCL 500 MG IV SOLR: 500.0000 mg | INTRAVENOUS | Status: DC | PRN

## 2014-11-10 MED ORDER — ALBUTEROL SULFATE (2.5 MG/3ML) 0.083% IN NEBU
3.0000 mL | INHALATION_SOLUTION | Freq: Four times a day (QID) | RESPIRATORY_TRACT | Status: DC | PRN
  Administered 2014-11-12 – 2014-11-13 (×2): 3 mL via RESPIRATORY_TRACT
  Filled 2014-11-10 (×2): qty 3

## 2014-11-10 MED ORDER — HEPARIN SODIUM (PORCINE) 5000 UNIT/ML IJ SOLN
5000.0000 [IU] | Freq: Three times a day (TID) | INTRAMUSCULAR | Status: DC
  Administered 2014-11-10 – 2014-11-14 (×8): 5000 [IU] via SUBCUTANEOUS
  Filled 2014-11-10 (×8): qty 1

## 2014-11-10 MED ORDER — INSULIN GLARGINE 100 UNIT/ML ~~LOC~~ SOLN
10.0000 [IU] | SUBCUTANEOUS | Status: DC
  Administered 2014-11-11 – 2014-11-13 (×2): 10 [IU] via SUBCUTANEOUS
  Filled 2014-11-10 (×4): qty 0.1

## 2014-11-10 MED ORDER — ASPIRIN 81 MG PO CHEW
162.0000 mg | CHEWABLE_TABLET | Freq: Once | ORAL | Status: AC
  Administered 2014-11-10: 162 mg via ORAL
  Filled 2014-11-10: qty 2

## 2014-11-10 MED ORDER — SODIUM CHLORIDE 0.9 % IJ SOLN
3.0000 mL | Freq: Two times a day (BID) | INTRAMUSCULAR | Status: DC
  Administered 2014-11-10 – 2014-11-13 (×7): 3 mL via INTRAVENOUS

## 2014-11-10 MED ORDER — GABAPENTIN 300 MG PO CAPS
300.0000 mg | ORAL_CAPSULE | Freq: Three times a day (TID) | ORAL | Status: DC
  Administered 2014-11-10 – 2014-11-14 (×11): 300 mg via ORAL
  Filled 2014-11-10 (×11): qty 1

## 2014-11-10 MED ORDER — VANCOMYCIN HCL IN DEXTROSE 1-5 GM/200ML-% IV SOLN
1000.0000 mg | Freq: Once | INTRAVENOUS | Status: AC
  Administered 2014-11-10: 1000 mg via INTRAVENOUS
  Filled 2014-11-10: qty 200

## 2014-11-10 MED ORDER — RENA-VITE PO TABS
1.0000 | ORAL_TABLET | Freq: Every day | ORAL | Status: DC
  Administered 2014-11-11 – 2014-11-14 (×4): 1 via ORAL
  Filled 2014-11-10 (×5): qty 1

## 2014-11-10 MED ORDER — ASPIRIN EC 81 MG PO TBEC
81.0000 mg | DELAYED_RELEASE_TABLET | Freq: Every day | ORAL | Status: DC
  Administered 2014-11-11 – 2014-11-14 (×3): 81 mg via ORAL
  Filled 2014-11-10 (×2): qty 1

## 2014-11-10 NOTE — ED Notes (Signed)
Patient left for xray.

## 2014-11-10 NOTE — H&P (Signed)
[facility] [place] NAME: [name]    MR#:  [id number]  DATE OF BIRTH:  1934/07/22  DATE OF ADMISSION:  11/10/2014  PRIMARY CARE PHYSICIAN: Rinaldo Cloud, MD   REQUESTING/REFERRING PHYSICIAN: Delman Kitten, MD  CHIEF COMPLAINT:   Chief Complaint  Patient presents with  . Cough   cough and shortness of breath  HISTORY OF PRESENT ILLNESS:  [name]  is a 80 y.o. male with a known history of hypertension, diabetes, ESRD on hemodialysis and anemia of chronic disease. The patient was sent to the ED due to cough and shortness of breath. Patient was treated with antibiotics for pneumonia 2 weeks ago. But the patient still has shortness of breath and cough. Patient was found hypoxia his O2 saturation at 80s. Patient got CAT scan of chest in ED, which showed bilateral upper pneumonia, congestion and granuloma changes. Dr. Jacqualine Code discussed with Dr. Stevenson Clinch, we will suggest to start vancomycin and Zosyn for treatment of pneumonia. Since he has elevated troponin, he was treated with aspirin in ED. The patient was also treated with Lasix 80 mg IV in ED.  PAST MEDICAL HISTORY:   Past Medical History  Diagnosis Date  . Diabetes mellitus without complication   . Glaucoma    hypertension, diabetes, ESRD on hemodialysis, renal cyst, MGUS, PAD, glaucoma, bilateral blind eyes,  and anemia of chronic disease. PAST SURGICAL HISTORY:   Past Surgical History  Procedure Laterality Date  . Leg amputation above knee  left   prostatectomy  SOCIAL HISTORY:   Social History  Substance Use Topics  . Smoking status: Former Research scientist (life sciences)  . Smokeless tobacco: Not on file  . Alcohol Use: No    FAMILY HISTORY:  History reviewed. No pertinent family history. Mother and father both deceased. He does not know what they died from; both were diabetic.   DRUG ALLERGIES:  No Known Allergies  REVIEW OF SYSTEMS:  CONSTITUTIONAL: No fever, has weakness.  EYES: No  blurred or double vision.  EARS, NOSE, AND THROAT: No tinnitus or ear pain.  RESPIRATORY: has cough, shortness of breath, no wheezing or hemoptysis.  CARDIOVASCULAR: No chest pain, orthopnea, edema.  GASTROINTESTINAL: No nausea, vomiting, diarrhea or abdominal pain.  GENITOURINARY: No dysuria, hematuria.  ENDOCRINE: No polyuria, nocturia,  HEMATOLOGY: No anemia, easy bruising or bleeding SKIN: No rash or lesion. MUSCULOSKELETAL: No joint pain or arthritis.   NEUROLOGIC: No tingling, numbness, weakness.  PSYCHIATRY: No anxiety or depression.   MEDICATIONS AT HOME:   Prior to Admission medications   Medication Sig Start Date End Date Taking? Authorizing Provider  albuterol (PROVENTIL HFA;VENTOLIN HFA) 108 (90 BASE) MCG/ACT inhaler Inhale 2 puffs into the lungs every 6 (six) hours as needed for wheezing or shortness of breath. 10/31/14  Yes Nicholes Mango, MD  aspirin EC 81 MG tablet Take 81 mg by mouth daily.   Yes Historical Provider, MD  cefdinir (OMNICEF) 300 MG capsule Take 1 capsule (300 mg total) by mouth 2 (two) times daily. 10/31/14  Yes Nicholes Mango, MD  Cholecalciferol (VITAMIN D3) 50000 UNITS CAPS Take 50,000 Units by mouth every 30 (thirty) days.   Yes Historical Provider, MD  gabapentin (NEURONTIN) 300 MG capsule Take 300 mg by mouth 3 (three) times daily.   Yes Historical Provider, MD  insulin glargine (LANTUS) 100 UNIT/ML injection Inject 10 Units into the skin every morning.   Yes Historical Provider, MD  Iron-Vitamin C (VITRON-C PO) Take 1 tablet by mouth  daily.   Yes Historical Provider, MD  lisinopril (PRINIVIL,ZESTRIL) 10 MG tablet Take 10 mg by mouth daily.   Yes Historical Provider, MD  metoprolol (LOPRESSOR) 50 MG tablet Take 50 mg by mouth 2 (two) times daily.   Yes Historical Provider, MD  multivitamin (RENA-VIT) TABS tablet Take 1 tablet by mouth daily.   Yes Historical Provider, MD  sevelamer (RENAGEL) 800 MG tablet Take 1,600 mg by mouth 3 (three) times daily with meals.    Yes Historical Provider, MD  Nutritional Supplements (FEEDING SUPPLEMENT, NEPRO CARB STEADY,) LIQD Take 237 mLs by mouth 3 (three) times daily with meals. 10/31/14   Nicholes Mango, MD  saccharomyces boulardii (FLORASTOR) 250 MG capsule Take 1 capsule (250 mg total) by mouth 2 (two) times daily. 10/31/14   Nicholes Mango, MD      VITAL SIGNS:  Blood pressure 119/52, pulse 86, temperature 98.5 F (36.9 C), resp. rate 28, height 5\' 6"  (1.676 m), weight 56.7 kg (125 lb), SpO2 100 %.  PHYSICAL EXAMINATION:  GENERAL:  80 y.o.-year-old patient lying in the bed with no acute distress.  EYES: Both eyes a blind. HEENT: Head atraumatic, normocephalic. Oropharynx and nasopharynx clear.  NECK:  Supple, no jugular venous distention. No thyroid enlargement, no tenderness.  LUNGS: Normal breath sounds bilaterally, no wheezing, but has crackles and weak breath sound around the left side. No use of accessory muscles of respiration.  CARDIOVASCULAR: S1, S2 normal. No murmurs, rubs, or gallops.  ABDOMEN: Soft, nontender, nondistended. Bowel sounds present. No organomegaly or mass.  EXTREMITIES: No pedal edema, cyanosis, or clubbing. Left AKA. NEUROLOGIC: Cranial nerves II through XII are intact. Muscle strength 5/5 in bilateral upper and right lower extremities. Sensation intact. Gait not checked.  PSYCHIATRIC: The patient is alert and oriented x 3.  SKIN: No obvious rash, lesion, or ulcer.   LABORATORY PANEL:   CBC  Recent Labs Lab 11/10/14 1544  WBC 5.3  HGB 10.0*  HCT 31.4*  PLT 329   ------------------------------------------------------------------------------------------------------------------  Chemistries   Recent Labs Lab 11/10/14 1544  NA 142  K 3.4*  CL 98*  CO2 30  GLUCOSE 108*  BUN 23*  CREATININE 5.17*  CALCIUM 8.0*   ------------------------------------------------------------------------------------------------------------------  Cardiac Enzymes  Recent Labs Lab  11/10/14 1544  TROPONINI 0.28*   ------------------------------------------------------------------------------------------------------------------  RADIOLOGY:  Dg Chest 2 View  11/10/2014   CLINICAL DATA:  80 year old male with 3 week history of cough.  EXAM: CHEST  2 VIEW  COMPARISON:  Chest x-ray 10/28/2014.  FINDINGS: There is cephalization of the pulmonary vasculature and slight indistinctness of the interstitial markings suggestive of mild pulmonary edema. Elevation of the left hemidiaphragm. Left perihilar prominence and slight asymmetry of airspace opacification, slightly improved compared to the prior examination. Small left pleural effusion. Heart size is upper limits of normal. Upper mediastinal contours are within normal limits. Atherosclerosis in the thoracic aorta.  IMPRESSION: 1. The overall appearance the chest suggests congestive heart failure, as above. 2. However, there is asymmetric left hilar fullness, elevation of the left hemidiaphragm and prominent perihilar opacity in the left lower lobe. The possibility of a centrally obstructing lesion is not excluded, and correlation with contrast enhanced chest CT should be considered if there is any clinical correlation for underlying malignancy. 3. Atherosclerosis.   Electronically Signed   By: Vinnie Langton M.D.   On: 11/10/2014 15:40   Ct Chest Wo Contrast  11/10/2014   CLINICAL DATA:  Cough for 3 weeks, hypoxia  EXAM: CT CHEST WITHOUT  CONTRAST  TECHNIQUE: Multidetector CT imaging of the chest was performed following the standard protocol without IV contrast.  COMPARISON:  Chest x-ray from earlier in the same day  FINDINGS: Bilateral pleural effusions are noted left greater than right. There are associated compressive atelectatic changes as well as some very patchy infiltrate in the posterior aspect of the upper lobes bilaterally. No focal parenchymal nodule is seen.  Scattered small hilar and mediastinal lymph nodes are identified with  calcification consistent with prior granulomatous disease. The hilar fullness seen on recent chest x-ray is related which in part to the pleural effusion as well as crowding of the vascular markings and upper lobe infiltrate. No focal mass lesion is seen. Aortic calcifications are noted as well as coronary calcifications.  The visualized upper abdomen shows no acute abnormality. The bony structures show degenerative change of the thoracic spine.  IMPRESSION: Bilateral pleural effusions left greater than right.  Patchy upper lobe infiltrate bilaterally.  Changes of prior granulomatous disease.  No focal mass lesion is identified correspond with that seen on the recent chest x-ray.   Electronically Signed   By: Inez Catalina M.D.   On: 11/10/2014 17:55    EKG:   Orders placed or performed during the hospital encounter of 11/10/14  . ED EKG  . ED EKG    IMPRESSION AND PLAN:   Bilateral pneumonia Fluid overload Hypoxia Elevated troponin, possible due to demand ischemia ESRD on hemodialysis  The patient will be admitted to medical floor with telemetry monitor. For bilateral pneumonia, I will continue the Zosyn and vancomycin dose per pharmacy. Give nebulizer and oxygen treatment. Follow-up blood culture, sputum culture and a CBC. Follow-up Dr. Stevenson Clinch, pulmonary physician. For fluid overload, the patient was treated with the Lasix 80 mg 1 dose in ED. He needs hemodialysis. For elevated troponin, I will follow-up troponin level and continue aspirin, follow-up cardiology consult. I will request a nephrology consult for ESRD to continue hemodialysis.   All the records are reviewed and case discussed with ED provider. Management plans discussed with the patient, the patient's caregiver and they are in agreement.  CODE STATUS: Full code. The patient wants full code for now. But according to patient's health care giver, the patient was on DO NOT RESUSCITATE status.  TOTAL TIME TAKING CARE OF THIS  PATIENT: 58 minutes.    Demetrios Loll M.D on 11/10/2014 at 9:00 PM  Between 7am to 6pm - Pager - [phone]  After 6pm go to [email or web address] - password [email or web address] [facility]  Office  [phone]  CC: Primary care physician; Rinaldo Cloud, MD

## 2014-11-10 NOTE — ED Notes (Signed)
Left fort CT

## 2014-11-10 NOTE — ED Notes (Signed)
Two unsuccessful IV attempts by ED medic. MD aware. Labs were drawn with a butterfly. Patient tolerated procedure well. Patient's O2 was removed and sats dropped to 87-88%. MD aware. Patient placed on 2L O2 via [place]. Pulse ox increased to 95%.

## 2014-11-10 NOTE — ED Provider Notes (Addendum)
[facility] Emergency Department Provider Note  ____________________________________________  Time seen: Approximately 3:11 PM  I have reviewed the triage vital signs and the nursing notes.   HISTORY  Chief Complaint Cough    HPI [name] is a 80 y.o. male history of dialysis and recent pneumonia. He presents today for ongoing shortness of breath which she says is not really improving over the last 2 weeks of being on antibiotic's. His recently admitted to the hospital and treated for pneumonia, but states he continues to cough and continues to feel short of breath. He had dialysis yesterday, and stated he had a normal run.  Denies any fever or chills, but states he has a productive ongoing cough. He feels short of breath at times. He denies any wheezing. He does note that he is blind. He has a wife at home versus him with care. He has not noticed any swelling in his right leg. He does have severe diabetes.  On further questioning, patient denies any travel outside the country, is no previous history of anything like tuberculosis, he does not have a history of being in prison.   Past Medical History  Diagnosis Date  . Diabetes mellitus without complication   . Glaucoma     Patient Active Problem List   Diagnosis Date Noted  . Healthcare-associated pneumonia 10/28/2014    Past Surgical History  Procedure Laterality Date  . Leg amputation above knee  left    Current Outpatient Rx  Name  Route  Sig  Dispense  Refill  . albuterol (PROVENTIL HFA;VENTOLIN HFA) 108 (90 BASE) MCG/ACT inhaler   Inhalation   Inhale 2 puffs into the lungs every 6 (six) hours as needed for wheezing or shortness of breath.   1 Inhaler   2   . aspirin EC 81 MG tablet   Oral   Take 81 mg by mouth daily.         . cefdinir (OMNICEF) 300 MG capsule   Oral   Take 1 capsule (300 mg total) by mouth 2 (two) times daily.   10 capsule   0   . gabapentin (NEURONTIN) 300 MG  capsule   Oral   Take 300 mg by mouth 3 (three) times daily.         Marland Kitchen lisinopril (PRINIVIL,ZESTRIL) 10 MG tablet   Oral   Take 10 mg by mouth daily.         . metoprolol (LOPRESSOR) 50 MG tablet   Oral   Take 50 mg by mouth 2 (two) times daily.         . multivitamin (RENA-VIT) TABS tablet   Oral   Take 1 tablet by mouth daily.         . Nutritional Supplements (FEEDING SUPPLEMENT, NEPRO CARB STEADY,) LIQD   Oral   Take 237 mLs by mouth 3 (three) times daily with meals.   90 Can   0   . saccharomyces boulardii (FLORASTOR) 250 MG capsule   Oral   Take 1 capsule (250 mg total) by mouth 2 (two) times daily.   30 capsule   0   . vitamin C (ASCORBIC ACID) 500 MG tablet   Oral   Take 500 mg by mouth daily.           Allergies Review of patient's allergies indicates no known allergies.  History reviewed. No pertinent family history.  Social History Social History  Substance Use Topics  . Smoking status: Former Research scientist (life sciences)  .  Smokeless tobacco: None  . Alcohol Use: No    Review of Systems Constitutional: No fever/chills Eyes: No visual changes. ENT: No sore throat. Cardiovascular: Denies chest pain. Respiratory: See history of present illness Gastrointestinal: No abdominal pain.  No nausea, no vomiting.  No diarrhea.  No constipation. Genitourinary: Negative for dysuria. He does produce some urine. Musculoskeletal: Negative for back pain. Skin: Negative for rash. Neurological: Negative for headaches, focal weakness or numbness.  10-point ROS otherwise negative.  ____________________________________________   PHYSICAL EXAM:  VITAL SIGNS: ED Triage Vitals  Enc Vitals Group     BP --      Pulse --      Resp --      Temp --      Temp src --      SpO2 --      Weight --      Height --      Head Cir --      Peak Flow --      Pain Score --      Pain Loc --      Pain Edu? --      Excl. in [place]? --     Constitutional: Alert and oriented. Well  appearing and in no acute distress. Eyes: Conjunctivae are normal. PERRL. EOMI. Head: Atraumatic. Hard of hearing. Severe cataracts bilaterally. Nose: No congestion/rhinnorhea. Mouth/Throat: Mucous membranes are moist.  Oropharynx non-erythematous. Neck: No stridor.   Cardiovascular: Normal rate, regular rhythm. Grossly normal heart sounds.  Good peripheral circulation. Respiratory: Normal respiratory effort.  No retractions. Rales in lung bases bilaterally. Frequent dry nonproductive cough. Gastrointestinal: Soft and nontender. No distention. No abdominal bruits. No CVA tenderness. Musculoskeletal: No lower extremity tenderness nor edema.  Left old amputation.  Neurologic:  Normal speech and language. No gross focal neurologic deficits are appreciated. No gait instability. Skin:  Skin is warm, dry and intact. No rash noted. Psychiatric: Mood and affect are normal. Speech and behavior are normal.  ____________________________________________   LABS (all labs ordered are listed, but only abnormal results are displayed)  Labs Reviewed  CBC WITH DIFFERENTIAL/PLATELET - Abnormal; Notable for the following:    RBC 4.37 (*)    Hemoglobin 10.0 (*)    HCT 31.4 (*)    MCV 71.7 (*)    MCH 22.8 (*)    MCHC 31.8 (*)    RDW 23.4 (*)    All other components within normal limits  BASIC METABOLIC PANEL - Abnormal; Notable for the following:    Potassium 3.4 (*)    Chloride 98 (*)    Glucose, Bld 108 (*)    BUN 23 (*)    Creatinine, Ser 5.17 (*)    Calcium 8.0 (*)    GFR calc non Af Amer 10 (*)    GFR calc Af Amer 11 (*)    All other components within normal limits  TROPONIN I - Abnormal; Notable for the following:    Troponin I 0.28 (*)    All other components within normal limits  BRAIN NATRIURETIC PEPTIDE - Abnormal; Notable for the following:    B Natriuretic Peptide >4500.0 (*)    All other components within normal limits    ____________________________________________  EKG Reviewed and interpreted by me Normal sinus rhythm Ventricular rate 80 PR 175 QRS 100 QTC 500 There are diffuse T-wave inversions noted in inferolateral and septal distribution, compared with previous EKG from 10/28/2014 is appear somewhat similar though are slightly more notable today and also T-wave  inversion is new in V3. ____________________________________________  RADIOLOGY  IMPRESSION: Bilateral pleural effusions left greater than right.  Patchy upper lobe infiltrate bilaterally.  Changes of prior granulomatous disease.  No focal mass lesion is identified correspond with that seen on the recent chest x-ray. ____________________________________________   PROCEDURES  Procedure(s) performed: None  Critical Care performed: No  ____________________________________________   INITIAL IMPRESSION / ASSESSMENT AND PLAN / ED COURSE  Pertinent labs & imaging results that were available during my care of the patient were reviewed by me and considered in my medical decision making (see chart for details).  Although not definitive, the patient does have evidence of granulomatous disease that is old and now has bilateral upper lobe infiltrates and what could well be an ongoing pneumonia that is not improved after treatment with vancomycin and Zosyn. We have no infectious disease expert on-call presently to assist, but I have paged her pulmonary doctor to discuss. It is unclear to me if this could represent tuberculosis, though the patient does not seem to have any overt exposure on my discussion with him. In addition, I suspect his dyspnea and room air hypoxia is likely secondary to volume overload, though he is awake and alert satting 99% eating a sandwich at 7 PM on 2 L.  We will admit the patient to hospitalist for ongoing workup and evaluation. I will give him a dose of Lasix here for some diuresis, and we will anticipate  nephrology consultation as well as pulmonary (Dr. Sharlene Dory) consultations tomorrow. Dr. Sharlene Dory recommends extended course of IV abx (Vancomycin/Zosyn), finds very unlikely TB without fever, elevated WBC. Will admit for ongoing work-up and consultations.  ECG with ongoing T-wave abnormalities, patient not having any chest pain but does have a slightly positive troponin. Based on his history I suspect this likely demand ischemia. We'll admit for ongoing workup and chest pain rule out   ____________________________________________   FINAL CLINICAL IMPRESSION(S) / ED DIAGNOSES  Final diagnoses:  Healthcare-associated pneumonia  Hypervolemia, unspecified hypervolemia type   cardiac demand ischemia    Delman Kitten, MD 11/10/14 1927  Delman Kitten, MD 11/10/14 1929

## 2014-11-10 NOTE — Progress Notes (Addendum)
ANTIBIOTIC CONSULT NOTE - INITIAL  Pharmacy Consult for abx dosing (vancomycin and Zosyn) Indication: HCAP  No Known Allergies  Patient Measurements: Height: 5\' 6"  (167.6 cm) Weight: 125 lb (56.7 kg) IBW/kg (Calculated) : 63.8 Adjusted Body Weight: 56.7kg  Vital Signs: Temp: 98.1 F (36.7 C) (08/13 2156) Temp Source: Oral (08/13 2156) BP: 134/60 mmHg (08/13 2156) Pulse Rate: 83 (08/13 2156) Intake/Output from previous day:   Intake/Output from this shift:    Labs:  Recent Labs  11/10/14 1544  WBC 5.3  HGB 10.0*  PLT 329  CREATININE 5.17*   Estimated Creatinine Clearance: 9.6 mL/min (by C-G formula based on Cr of 5.17). No results for input(s): VANCOTROUGH, VANCOPEAK, VANCORANDOM, GENTTROUGH, GENTPEAK, GENTRANDOM, TOBRATROUGH, TOBRAPEAK, TOBRARND, AMIKACINPEAK, AMIKACINTROU, AMIKACIN in the last 72 hours.   Microbiology: Recent Results (from the past 720 hour(s))  Blood Culture (routine x 2)     Status: None   Collection Time: 10/28/14  5:30 PM  Result Value Ref Range Status   Specimen Description BLOOD LEFT ASSIST CONTROL  Final   Special Requests   Final    BOTTLES DRAWN AEROBIC AND ANAEROBIC  AER 4CC ANA 2CC   Culture NO GROWTH 5 DAYS  Final   Report Status 11/02/2014 FINAL  Final  Blood Culture (routine x 2)     Status: None   Collection Time: 10/28/14  5:43 PM  Result Value Ref Range Status   Specimen Description BLOOD LEFT ARM  Final   Special Requests   Final    BOTTLES DRAWN AEROBIC AND ANAEROBIC  AER 4CC ANA 2CC   Culture NO GROWTH 5 DAYS  Final   Report Status 11/02/2014 FINAL  Final  MRSA PCR Screening     Status: None   Collection Time: 10/30/14  5:17 AM  Result Value Ref Range Status   MRSA by PCR NEGATIVE NEGATIVE Final    Comment:        The GeneXpert MRSA Assay (FDA approved for NASAL specimens only), is one component of a comprehensive MRSA colonization surveillance program. It is not intended to diagnose MRSA infection nor to guide  or monitor treatment for MRSA infections.     Medical History: Past Medical History  Diagnosis Date  . Diabetes mellitus without complication   . Glaucoma   . Chronic kidney disease   . Hypertension   . Peripheral vascular disease   . Anemia of chronic disease   . MGUS (monoclonal gammopathy of unknown significance)   . Renal cyst     Medications:   Assessment: Pan cx pending CXR: Bilateral upper lobe infiltrate   Goal of Therapy:  Resolve infection.  Plan:  Zosyn 3.375 grams q 12 hours ordered.   Vancomycin 1250 mg total load dose ordered. 500 mg PRN ordered to be given at end of HD session. Level will need to be ordered before 3rd HD session.    Herb Beltre S 11/10/2014,10:05 PM

## 2014-11-10 NOTE — ED Notes (Signed)
Patient states he has been in here several times in last 3 weeks for a cough. Patient reports that the antibiotics are not helping and that the cough is keeping him awake at night.

## 2014-11-11 DIAGNOSIS — J189 Pneumonia, unspecified organism: Principal | ICD-10-CM

## 2014-11-11 DIAGNOSIS — R0902 Hypoxemia: Secondary | ICD-10-CM

## 2014-11-11 DIAGNOSIS — E8770 Fluid overload, unspecified: Secondary | ICD-10-CM

## 2014-11-11 LAB — GLUCOSE, CAPILLARY
GLUCOSE-CAPILLARY: 101 mg/dL — AB (ref 65–99)
GLUCOSE-CAPILLARY: 99 mg/dL (ref 65–99)
Glucose-Capillary: 129 mg/dL — ABNORMAL HIGH (ref 65–99)
Glucose-Capillary: 220 mg/dL — ABNORMAL HIGH (ref 65–99)

## 2014-11-11 LAB — TROPONIN I: Troponin I: 0.24 ng/mL — ABNORMAL HIGH (ref <0.031)

## 2014-11-11 MED ORDER — ONDANSETRON HCL 4 MG/2ML IJ SOLN
4.0000 mg | Freq: Four times a day (QID) | INTRAMUSCULAR | Status: DC | PRN
  Administered 2014-11-11: 4 mg via INTRAVENOUS
  Filled 2014-11-11: qty 2

## 2014-11-11 MED ORDER — VANCOMYCIN HCL 500 MG IV SOLR
500.0000 mg | INTRAVENOUS | Status: DC
  Administered 2014-11-12: 500 mg via INTRAVENOUS
  Filled 2014-11-11 (×2): qty 500

## 2014-11-11 MED ORDER — DIPHENHYDRAMINE HCL 25 MG PO CAPS
25.0000 mg | ORAL_CAPSULE | Freq: Every evening | ORAL | Status: DC | PRN
  Administered 2014-11-11 – 2014-11-13 (×3): 25 mg via ORAL
  Filled 2014-11-11 (×3): qty 1

## 2014-11-11 MED ORDER — GUAIFENESIN-CODEINE 100-10 MG/5ML PO SOLN
10.0000 mL | ORAL | Status: DC | PRN
  Administered 2014-11-11 – 2014-11-14 (×5): 10 mL via ORAL
  Filled 2014-11-11 (×5): qty 10

## 2014-11-11 NOTE — Progress Notes (Signed)
[facility] NAME: [name]    MR#:  [id number]  DATE OF BIRTH:  Nov 19, 1934  SUBJECTIVE:  CHIEF COMPLAINT:  Patient is still coughing and not feeling well but shortness of breath is better than yesterday He is legally blind  REVIEW OF SYSTEMS:  CONSTITUTIONAL: No fever, fatigue or weakness.  EYES: Patient is legally blind EARS, NOSE, AND THROAT: No tinnitus or ear pain.  RESPIRATORY:  reporting cough, shortness of breath is better, wheezing or hemoptysis.  CARDIOVASCULAR: No chest pain, orthopnea, edema.  GASTROINTESTINAL: No nausea, vomiting, diarrhea or abdominal pain.  GENITOURINARY: No dysuria, hematuria.  ENDOCRINE: No polyuria, nocturia,  HEMATOLOGY: No anemia, easy bruising or bleeding SKIN: No rash or lesion. MUSCULOSKELETAL: No joint pain or arthritis.   NEUROLOGIC: No tingling, numbness, weakness.  PSYCHIATRY: No anxiety or depression.   DRUG ALLERGIES:  No Known Allergies  VITALS:  Blood pressure 159/70, pulse 89, temperature 98.7 F (37.1 C), temperature source Oral, resp. rate 24, height 5\' 6"  (1.676 m), weight 57.516 kg (126 lb 12.8 oz), SpO2 88 %.  PHYSICAL EXAMINATION:  GENERAL:  80 y.o.-year-old patient lying in the bed with no acute distress.  EYES: Patient is legally blind No scleral icterus.  HEENT: Head atraumatic, normocephalic. Oropharynx and nasopharynx clear.  NECK:  Supple, no jugular venous distention. No thyroid enlargement, no tenderness.  LUNGS: Decreased breath sounds bilaterally, moderate air entry , positive crackles, no wheezing, rales,rhonchi . No use of accessory muscles of respiration.  CARDIOVASCULAR: S1, S2 normal. No murmurs, rubs, or gallops.  ABDOMEN: Soft, nontender, nondistended. Bowel sounds present. No organomegaly or mass.  EXTREMITIES: No pedal edema, cyanosis, or clubbing.  NEUROLOGIC: Cranial nerves II through XII are intact. Muscle strength 5/5 in all extremities.  Sensation intact. Gait not checked.  PSYCHIATRIC: The patient is alert and oriented x 3.  SKIN: No obvious rash, lesion, or ulcer.    LABORATORY PANEL:   CBC  Recent Labs Lab 11/10/14 1544  WBC 5.3  HGB 10.0*  HCT 31.4*  PLT 329   ------------------------------------------------------------------------------------------------------------------  Chemistries   Recent Labs Lab 11/10/14 1544  NA 142  K 3.4*  CL 98*  CO2 30  GLUCOSE 108*  BUN 23*  CREATININE 5.17*  CALCIUM 8.0*   ------------------------------------------------------------------------------------------------------------------  Cardiac Enzymes  Recent Labs Lab 11/11/14 0211  TROPONINI 0.24*   ------------------------------------------------------------------------------------------------------------------  RADIOLOGY:  Dg Chest 2 View  11/10/2014   CLINICAL DATA:  80 year old male with 3 week history of cough.  EXAM: CHEST  2 VIEW  COMPARISON:  Chest x-ray 10/28/2014.  FINDINGS: There is cephalization of the pulmonary vasculature and slight indistinctness of the interstitial markings suggestive of mild pulmonary edema. Elevation of the left hemidiaphragm. Left perihilar prominence and slight asymmetry of airspace opacification, slightly improved compared to the prior examination. Small left pleural effusion. Heart size is upper limits of normal. Upper mediastinal contours are within normal limits. Atherosclerosis in the thoracic aorta.  IMPRESSION: 1. The overall appearance the chest suggests congestive heart failure, as above. 2. However, there is asymmetric left hilar fullness, elevation of the left hemidiaphragm and prominent perihilar opacity in the left lower lobe. The possibility of a centrally obstructing lesion is not excluded, and correlation with contrast enhanced chest CT should be considered if there is any clinical correlation for underlying malignancy. 3. Atherosclerosis.   Electronically Signed    By: Vinnie Langton M.D.   On: 11/10/2014 15:40   Ct Chest Wo  Contrast  11/10/2014   CLINICAL DATA:  Cough for 3 weeks, hypoxia  EXAM: CT CHEST WITHOUT CONTRAST  TECHNIQUE: Multidetector CT imaging of the chest was performed following the standard protocol without IV contrast.  COMPARISON:  Chest x-ray from earlier in the same day  FINDINGS: Bilateral pleural effusions are noted left greater than right. There are associated compressive atelectatic changes as well as some very patchy infiltrate in the posterior aspect of the upper lobes bilaterally. No focal parenchymal nodule is seen.  Scattered small hilar and mediastinal lymph nodes are identified with calcification consistent with prior granulomatous disease. The hilar fullness seen on recent chest x-ray is related which in part to the pleural effusion as well as crowding of the vascular markings and upper lobe infiltrate. No focal mass lesion is seen. Aortic calcifications are noted as well as coronary calcifications.  The visualized upper abdomen shows no acute abnormality. The bony structures show degenerative change of the thoracic spine.  IMPRESSION: Bilateral pleural effusions left greater than right.  Patchy upper lobe infiltrate bilaterally.  Changes of prior granulomatous disease.  No focal mass lesion is identified correspond with that seen on the recent chest x-ray.   Electronically Signed   By: Inez Catalina M.D.   On: 11/10/2014 17:55    EKG:   Orders placed or performed during the hospital encounter of 11/10/14  . ED EKG  . ED EKG  . EKG 12-Lead  . EKG 12-Lead  . EKG 12-Lead  . EKG 12-Lead    ASSESSMENT AND PLAN:   1. Bilateral pneumonia with hypoxia and bilateral pleural effusions with past medical history of Klebsiella Pneumonia and Chyrseobacterium Continue IV antibiotics Zosyn and vancomycin Follow-up blood culture, sputum culture and a CBC.  Follow-up Dr. Stevenson Clinch, pulmonary physician, is recommending left-sided  thoracentesis by interventional radiology, scheduled for tomorrow Nothing by mouth after midnight    2.Fluid overload with history of end-stage renal disease on hemodialysis Continue hemodialysis Patient has received Lasix 80 mg 1 in the ED  3.Elevated troponin, possible due to demand ischemia  Troponin is not trending, ruled out acute coronary syndrome Continue aspirin and follow-up with cardiology consult  4. ESRD on hemodialysis Nephrology is consulted, will continue hemodialysis on Monday, Wednesday and Friday  5. History of diabetes mellitus Will provide him diabetic diet Sliding scale insulin Continue Lantus and titrate dose as needed     All the records are reviewed and case discussed with Care Management/Social Workerr. Management plans discussed with the patient, family and they are in agreement.  CODE STATUS: Full code  TOTAL TIME TAKING CARE OF THIS PATIENT: 35 minutes.   POSSIBLE D/C IN DAYS2-3 , DEPENDING ON CLINICAL CONDITION.   Nicholes Mango M.D on 11/11/2014 at 12:58 PM  Between 7am to 6pm - Pager - [phone] After 6pm go to [email or web address] - password [email or web address] [facility]  Office  [phone]  CC: Primary care physician; Rinaldo Cloud, MD

## 2014-11-11 NOTE — Progress Notes (Signed)
PULMONARY / CRITICAL CARE MEDICINE   Name: [name] MRN: [id number] DOB: [birth year]    ADMISSION DATE:  11/10/2014 CONSULTATION DATE:  11/11/14  REFERRING MD :  Dr. Bridgett Larsson   CHIEF COMPLAINT:    Cough and Short of breath   HISTORY OF PRESENT ILLNESS   [name] is a 80 y.o. male with a known history of hypertension, diabetes, ESRD on hemodialysis (MWF), and anemia of chronic disease. The patient was sent to the ED due to cough and shortness of breath, [facility]. Patient was treated with antibiotics for pneumonia 2 weeks ago. But the patient still has shortness of breath and cough. Patient was found hypoxia his O2 saturation at 80s. Patient CT Chest  showed bilateral upper pneumonia, congestion and granuloma changes. Patient and step daughter stated that since his initial pneumonia in May 2016, he hasn't been able to clear the cough and shortness of breath   PAST MEDICAL HISTORY    :  Past Medical History  Diagnosis Date  . Diabetes mellitus without complication   . Glaucoma   . Chronic kidney disease   . Hypertension   . Peripheral vascular disease   . Anemia of chronic disease   . MGUS (monoclonal gammopathy of unknown significance)   . Renal cyst    Past Surgical History  Procedure Laterality Date  . Leg amputation above knee  left  . Prostatectomy     Prior to Admission medications   Medication Sig Start Date End Date Taking? Authorizing Provider  albuterol (PROVENTIL HFA;VENTOLIN HFA) 108 (90 BASE) MCG/ACT inhaler Inhale 2 puffs into the lungs every 6 (six) hours as needed for wheezing or shortness of breath. 10/31/14  Yes Nicholes Mango, MD  aspirin EC 81 MG tablet Take 81 mg by mouth daily.   Yes Historical Provider, MD  cefdinir (OMNICEF) 300 MG capsule Take 1 capsule (300 mg total) by mouth 2 (two) times daily. 10/31/14  Yes Nicholes Mango, MD  Cholecalciferol (VITAMIN D3) 50000 UNITS CAPS Take 50,000 Units by mouth every 30 (thirty) days.   Yes Historical  Provider, MD  gabapentin (NEURONTIN) 300 MG capsule Take 300 mg by mouth 3 (three) times daily.   Yes Historical Provider, MD  insulin glargine (LANTUS) 100 UNIT/ML injection Inject 10 Units into the skin every morning.   Yes Historical Provider, MD  Iron-Vitamin C (VITRON-C PO) Take 1 tablet by mouth daily.   Yes Historical Provider, MD  lisinopril (PRINIVIL,ZESTRIL) 10 MG tablet Take 10 mg by mouth daily.   Yes Historical Provider, MD  metoprolol (LOPRESSOR) 50 MG tablet Take 50 mg by mouth 2 (two) times daily.   Yes Historical Provider, MD  multivitamin (RENA-VIT) TABS tablet Take 1 tablet by mouth daily.   Yes Historical Provider, MD  sevelamer (RENAGEL) 800 MG tablet Take 1,600 mg by mouth 3 (three) times daily with meals.   Yes Historical Provider, MD  Nutritional Supplements (FEEDING SUPPLEMENT, NEPRO CARB STEADY,) LIQD Take 237 mLs by mouth 3 (three) times daily with meals. 10/31/14   Nicholes Mango, MD  saccharomyces boulardii (FLORASTOR) 250 MG capsule Take 1 capsule (250 mg total) by mouth 2 (two) times daily. 10/31/14   Nicholes Mango, MD   No Known Allergies   FAMILY HISTORY   Family History  Problem Relation Age of Onset  . Diabetes Mother   . Diabetes Father       SOCIAL HISTORY  Former smoker - 20 years, .05-1 ppd  Worked in [employer]  ROS  CONSTITUTIONAL: No fever, has weakness.  EYES: No blurred or double vision.  EARS, NOSE, AND THROAT: No tinnitus or ear pain.  RESPIRATORY: has cough, shortness of breath, no wheezing or hemoptysis.  CARDIOVASCULAR: No chest pain, orthopnea, edema.  GASTROINTESTINAL: No nausea, vomiting, diarrhea or abdominal pain.  GENITOURINARY: No dysuria, hematuria.  ENDOCRINE: No polyuria, nocturia,  HEMATOLOGY: No anemia, easy bruising or bleeding SKIN: No rash or lesion. MUSCULOSKELETAL: No joint pain or arthritis.  NEUROLOGIC: No tingling, numbness, weakness.  PSYCHIATRY: No anxiety or depression.    VITAL SIGNS    Temp:   [98.1 F (36.7 C)-98.7 F (37.1 C)] 98.7 F (37.1 C) (08/14 1123) Pulse Rate:  [80-99] 89 (08/14 1123) Resp:  [16-28] 24 (08/14 1123) BP: (119-159)/(52-75) 159/70 mmHg (08/14 1123) SpO2:  [88 %-100 %] 88 % (08/14 1123) Weight:  [125 lb (56.7 kg)-126 lb 12.8 oz (57.516 kg)] 126 lb 12.8 oz (57.516 kg) (08/14 0520) HEMODYNAMICS:   VENTILATOR SETTINGS:   INTAKE / OUTPUT:  Intake/Output Summary (Last 24 hours) at 11/11/14 1130 Last data filed at 11/11/14 0900  Gross per 24 hour  Intake      0 ml  Output      0 ml  Net      0 ml       PHYSICAL EXAM   Physical Exam GENERAL: BAD EYES: Both eyes a blind. HEENT: Head atraumatic, normocephalic. Oropharynx and nasopharynx clear.  NECK: Supple, no jugular venous distention. No thyroid enlargement, no tenderness.  LUNGS: Normal breath sounds bilaterally, no wheezing, no crackles, mild shallow breath sound. No use of accessory muscles of respiration.  CARDIOVASCULAR: S1, S2 normal. No murmurs, rubs, or gallops.  ABDOMEN: Soft, nontender, nondistended. Bowel sounds present. No organomegaly or mass.  EXTREMITIES: No pedal edema, cyanosis, or clubbing. Left AKA. NEUROLOGIC: Cranial nerves II through XII are intact. Muscle strength 5/5 in bilateral upper and right lower extremities. Sensation intact. Gait not checked.  PSYCHIATRIC: The patient is alert and oriented x 3.  SKIN: No obvious rash, lesion, or ulcer.     LABS   LABS:  CBC  Recent Labs Lab 11/10/14 1544  WBC 5.3  HGB 10.0*  HCT 31.4*  PLT 329   Coag's No results for input(s): APTT, INR in the last 168 hours. BMET  Recent Labs Lab 11/10/14 1544  NA 142  K 3.4*  CL 98*  CO2 30  BUN 23*  CREATININE 5.17*  GLUCOSE 108*   Electrolytes  Recent Labs Lab 11/10/14 1544  CALCIUM 8.0*   Sepsis Markers No results for input(s): LATICACIDVEN, PROCALCITON, O2SATVEN in the last 168 hours. ABG No results for input(s): PHART, PCO2ART, PO2ART in the last  168 hours. Liver Enzymes No results for input(s): AST, ALT, ALKPHOS, BILITOT, ALBUMIN in the last 168 hours. Cardiac Enzymes  Recent Labs Lab 11/10/14 1544 11/10/14 2014 11/11/14 0211  TROPONINI 0.28* 0.27* 0.24*   Glucose  Recent Labs Lab 11/10/14 2158 11/11/14 0726  GLUCAP 171* 101*     Recent Results (from the past 240 hour(s))  Culture, blood (routine x 2) Call MD if unable to obtain prior to antibiotics being given     Status: None (Preliminary result)   Collection Time: 11/10/14  8:14 PM  Result Value Ref Range Status   Specimen Description BLOOD LEFT FATTY CASTS  Final   Special Requests BOTTLES DRAWN AEROBIC AND ANAEROBIC 3CC  Final   Culture NO GROWTH < 12 HOURS  Final   Report Status PENDING  Incomplete  Culture, blood (routine x 2) Call MD if unable to obtain prior to antibiotics being given     Status: None (Preliminary result)   Collection Time: 11/10/14  8:30 PM  Result Value Ref Range Status   Specimen Description BLOOD LEFT WRIST  Final   Special Requests BOTTLES DRAWN AEROBIC AND ANAEROBIC 4CC  Final   Culture NO GROWTH < 12 HOURS  Final   Report Status PENDING  Incomplete     Current facility-administered medications:  .  0.9 %  sodium chloride infusion, 250 mL, Intravenous, PRN, Demetrios Loll, MD .  albuterol (PROVENTIL) (2.5 MG/3ML) 0.083% nebulizer solution 3 mL, 3 mL, Inhalation, Q6H PRN, Demetrios Loll, MD .  aspirin EC tablet 81 mg, 81 mg, Oral, Daily, Demetrios Loll, MD, 81 mg at 11/10/14 2200 .  diphenhydrAMINE (BENADRYL) capsule 25 mg, 25 mg, Oral, QHS PRN, Lytle Butte, MD, 25 mg at 11/11/14 0057 .  gabapentin (NEURONTIN) capsule 300 mg, 300 mg, Oral, TID, Demetrios Loll, MD, 300 mg at 11/10/14 2253 .  guaiFENesin-codeine 100-10 MG/5ML solution 10 mL, 10 mL, Oral, Q4H PRN, Lytle Butte, MD, 10 mL at 11/11/14 0516 .  heparin injection 5,000 Units, 5,000 Units, Subcutaneous, 3 times per day, Demetrios Loll, MD, 5,000 Units at 11/11/14 0516 .  insulin aspart  (novoLOG) injection 0-5 Units, 0-5 Units, Subcutaneous, QHS, Demetrios Loll, MD, 0 Units at 11/10/14 2238 .  insulin aspart (novoLOG) injection 0-9 Units, 0-9 Units, Subcutaneous, TID WC, Demetrios Loll, MD, 0 Units at 11/11/14 0850 .  insulin glargine (LANTUS) injection 10 Units, 10 Units, Subcutaneous, BH-q7a, Demetrios Loll, MD, 10 Units at 11/11/14 0930 .  lisinopril (PRINIVIL,ZESTRIL) tablet 10 mg, 10 mg, Oral, Daily, Demetrios Loll, MD, 10 mg at 11/10/14 2200 .  metoprolol (LOPRESSOR) tablet 50 mg, 50 mg, Oral, BID, Demetrios Loll, MD, 50 mg at 11/10/14 2200 .  multivitamin (RENA-VIT) tablet 1 tablet, 1 tablet, Oral, Daily, Demetrios Loll, MD, 1 tablet at 11/10/14 2200 .  piperacillin-tazobactam (ZOSYN) IVPB 3.375 g, 3.375 g, Intravenous, Q12H, Demetrios Loll, MD, 3.375 g at 11/11/14 0010 .  sevelamer carbonate (RENVELA) tablet 800 mg, 800 mg, Oral, TID WC, Demetrios Loll, MD, 800 mg at 11/11/14 0859 .  sodium chloride 0.9 % injection 3 mL, 3 mL, Intravenous, Q12H, Demetrios Loll, MD, 3 mL at 11/10/14 2254 .  sodium chloride 0.9 % injection 3 mL, 3 mL, Intravenous, PRN, Demetrios Loll, MD .  Derrill Memo ON 11/12/2014] vancomycin (VANCOCIN) 500 mg in sodium chloride 0.9 % 100 mL IVPB, 500 mg, Intravenous, Q M,W,F-HD, Nicholes Mango, MD  IMAGING    Dg Chest 2 View  11/10/2014   CLINICAL DATA:  80 year old male with 3 week history of cough.  EXAM: CHEST  2 VIEW  COMPARISON:  Chest x-ray 10/28/2014.  FINDINGS: There is cephalization of the pulmonary vasculature and slight indistinctness of the interstitial markings suggestive of mild pulmonary edema. Elevation of the left hemidiaphragm. Left perihilar prominence and slight asymmetry of airspace opacification, slightly improved compared to the prior examination. Small left pleural effusion. Heart size is upper limits of normal. Upper mediastinal contours are within normal limits. Atherosclerosis in the thoracic aorta.  IMPRESSION: 1. The overall appearance the chest suggests congestive heart failure, as  above. 2. However, there is asymmetric left hilar fullness, elevation of the left hemidiaphragm and prominent perihilar opacity in the left lower lobe. The possibility of a centrally obstructing lesion is not excluded, and correlation with contrast enhanced chest CT should be considered if  there is any clinical correlation for underlying malignancy. 3. Atherosclerosis.   Electronically Signed   By: Vinnie Langton M.D.   On: 11/10/2014 15:40   Ct Chest Wo Contrast  11/10/2014   CLINICAL DATA:  Cough for 3 weeks, hypoxia  EXAM: CT CHEST WITHOUT CONTRAST  TECHNIQUE: Multidetector CT imaging of the chest was performed following the standard protocol without IV contrast.  COMPARISON:  Chest x-ray from earlier in the same day  FINDINGS: Bilateral pleural effusions are noted left greater than right. There are associated compressive atelectatic changes as well as some very patchy infiltrate in the posterior aspect of the upper lobes bilaterally. No focal parenchymal nodule is seen.  Scattered small hilar and mediastinal lymph nodes are identified with calcification consistent with prior granulomatous disease. The hilar fullness seen on recent chest x-ray is related which in part to the pleural effusion as well as crowding of the vascular markings and upper lobe infiltrate. No focal mass lesion is seen. Aortic calcifications are noted as well as coronary calcifications.  The visualized upper abdomen shows no acute abnormality. The bony structures show degenerative change of the thoracic spine.  IMPRESSION: Bilateral pleural effusions left greater than right.  Patchy upper lobe infiltrate bilaterally.  Changes of prior granulomatous disease.  No focal mass lesion is identified correspond with that seen on the recent chest x-ray.   Electronically Signed   By: Inez Catalina M.D.   On: 11/10/2014 17:55     ASSESSMENT/PLAN  80 yo male with PMHx of ESRD, GERD, L AKA, DM, seen in consultation for recurrent  HCAP  PULMONARY Bilateral upper lobe opacities - incompletely clear pneumonia HCAP Pleural Effusion Cough\sob P:   Treated with Vanc\Zosyn at last admission, little improvement upon discharge Restart Vanc/Zosyn Recheck sputum Sputum Culture Has a hx of K.pneumoniae in sputum and CHRYSEOBACTERIUM INDOLOGENES in 10/2013 Bilateral Pl Effusion - L>R, recommend left side U/S Thoracentesis by IR, send studies of Micro Incentive spirometry   CARDIOVASCULAR CVL - none Cont with montioring   RENAL ESRD - on dialysis  GASTROINTESTINAL Cont with current diet and GERD tx  HEMATOLOGIC A:  AOCD P:  montior H\H  INFECTIOUS A:  HCAP P:   Check sputum culture Recommend Left sided U/S thoracentesis by IR, send fluid for Micro studies  BCx2 8/13>> UC  Sputum 8/14>> Abx: Vanc/Zosyn, start date 8/13, day 2   PCCM will continue to follow along.   I have personally obtained a history, examined the patient, evaluated laboratory and imaging results, formulated the assessment and plan and placed orders.  Pulmonary Consult time - 62mins   Vilinda Boehringer, MD [facility] Pager [phone] (Please enter [phone])     11/11/2014, 11:30 AM

## 2014-11-11 NOTE — Consult Note (Signed)
[place]  CARDIOLOGY CONSULT NOTE  Patient ID: [name] MRN: [id number] DOB/AGE: [birth year] 80 y.o.  Admit date: 11/10/2014 Referring Physician gouru Primary Physician   Primary Cardiologist   Reason for Consultation troponin elevation  HPI: Pt with history of esrd on hd, who presented to er with sob and hypoxia. He was noted to have bilateral pneumonia, evience of volume overload on cxr. ER labs revealed elevated serum troponinOf 0.28 and 0.24.  Patient is a poor historian.  He is unable to give history of chest pain.  Electrocardiogram reveals no significant arrhythmias or ischemia.  Chest x-ray revealed bilateral pleural effusions.  ROS.  Past Medical History  Diagnosis Date  . Diabetes mellitus without complication   . Glaucoma   . Chronic kidney disease   . Hypertension   . Peripheral vascular disease   . Anemia of chronic disease   . MGUS (monoclonal gammopathy of unknown significance)   . Renal cyst     Family History  Problem Relation Age of Onset  . Diabetes Mother   . Diabetes Father     Social History   Social History  . Marital Status: Married    Spouse Name: N/A  . Number of Children: N/A  . Years of Education: N/A   Occupational History  . Not on file.   Social History Main Topics  . Smoking status: Former Research scientist (life sciences)  . Smokeless tobacco: Not on file  . Alcohol Use: No  . Drug Use: No  . Sexual Activity: Not on file   Other Topics Concern  . Not on file   Social History Narrative    Past Surgical History  Procedure Laterality Date  . Leg amputation above knee  left  . Prostatectomy       Prescriptions prior to admission  Medication Sig Dispense Refill Last Dose  . albuterol (PROVENTIL HFA;VENTOLIN HFA) 108 (90 BASE) MCG/ACT inhaler Inhale 2 puffs into the lungs every 6 (six) hours as needed for wheezing or shortness of breath. 1 Inhaler 2 11/10/2014 at Unknown time  . aspirin EC 81 MG tablet Take 81 mg  by mouth daily.   11/10/2014 at Unknown time  . cefdinir (OMNICEF) 300 MG capsule Take 1 capsule (300 mg total) by mouth 2 (two) times daily. 10 capsule 0 11/10/2014 at Unknown time  . Cholecalciferol (VITAMIN D3) 50000 UNITS CAPS Take 50,000 Units by mouth every 30 (thirty) days.   Past Month at Unknown time  . gabapentin (NEURONTIN) 300 MG capsule Take 300 mg by mouth 3 (three) times daily.   11/10/2014 at Unknown time  . insulin glargine (LANTUS) 100 UNIT/ML injection Inject 10 Units into the skin every morning.   11/10/2014 at Unknown time  . Iron-Vitamin C (VITRON-C PO) Take 1 tablet by mouth daily.   11/10/2014 at Unknown time  . lisinopril (PRINIVIL,ZESTRIL) 10 MG tablet Take 10 mg by mouth daily.   11/10/2014 at Unknown time  . metoprolol (LOPRESSOR) 50 MG tablet Take 50 mg by mouth 2 (two) times daily.   11/10/2014 at 1030  . multivitamin (RENA-VIT) TABS tablet Take 1 tablet by mouth daily.   11/10/2014 at Unknown time  . sevelamer (RENAGEL) 800 MG tablet Take 1,600 mg by mouth 3 (three) times daily with meals.   11/10/2014 at Unknown time  . Nutritional Supplements (FEEDING SUPPLEMENT, NEPRO CARB STEADY,) LIQD Take 237 mLs by mouth 3 (three) times daily with meals. 90 Can 0   . saccharomyces boulardii (FLORASTOR)  250 MG capsule Take 1 capsule (250 mg total) by mouth 2 (two) times daily. 30 capsule 0     Physical Exam: Blood pressure 159/70, pulse 89, temperature 98.7 F (37.1 C), temperature source Oral, resp. rate 24, height 5\' 6"  (1.676 m), weight 57.516 kg (126 lb 12.8 oz), SpO2 88 %.    General appearance: slowed mentation Resp: rhonchi bilaterally Cardio: regular rate and rhythm GI: soft, non-tender; bowel sounds normal; no masses,  no organomegaly Extremities: extremities normal, atraumatic, no cyanosis or edema Pulses: 2+ and symmetric Neurologic: Grossly normal Labs:   Lab Results  Component Value Date   WBC 5.3 11/10/2014   HGB 10.0* 11/10/2014   HCT 31.4* 11/10/2014   MCV  71.7* 11/10/2014   PLT 329 11/10/2014    Recent Labs Lab 11/10/14 1544  NA 142  K 3.4*  CL 98*  CO2 30  BUN 23*  CREATININE 5.17*  CALCIUM 8.0*  GLUCOSE 108*   Lab Results  Component Value Date   TROPONINI 0.24* 11/11/2014      Radiology:   Bilateral pleural effusions.   Pulmonary vascular engorgement EKG:  Normal sinus rhythm nonspecific ST T wave changes  ASSESSMENT AND PLAN:   Patient with history of end-stage renal disease on hemodialysis as well as hypertension who was admitted with pneumonia, mild pulmonary edema who was also noted to have a mild serum troponin elevation.  EKG shows no acute injury.  Evidence of elevated troponin is likely secondary to demand ischemia with his pneumonia and renal failure.  Would not proceed with invasive evaluation.  Will continue with current therapy including hemodialysis.  Would continue her beta-blocker.  Would  Not proceed with invasive evaluation at this time.  Further recommendations pending course Signed: Teodoro Spray MD, [facility] 11/11/2014, 5:56 PM

## 2014-11-11 NOTE — Progress Notes (Signed)
Pt requesting something to help with sleep and cough. MD, Dr. Lavetta Nielsen notified-- MD to place orders for benadryl and cough syrup.  Will continue to monitor. [name]

## 2014-11-11 NOTE — Progress Notes (Signed)
[facility]  ROUNDING NOTE   Subjective:   Admitted yesterday for cough, fever and shortness of breath. Started on oxygen.  Empiric antibiotics pip/tazo and vancomycin.  Completed hemodialysis on Friday.    Objective:  Vital signs in last 24 hours:  Temp:  [98.1 F (36.7 C)-98.7 F (37.1 C)] 98.1 F (36.7 C) (08/14 0800) Pulse Rate:  [80-99] 99 (08/14 0800) Resp:  [16-28] 16 (08/14 0800) BP: (119-157)/(52-75) 154/69 mmHg (08/14 0800) SpO2:  [88 %-100 %] 93 % (08/14 0800) Weight:  [56.7 kg (125 lb)-57.516 kg (126 lb 12.8 oz)] 57.516 kg (126 lb 12.8 oz) (08/14 0520)  Weight change:  Filed Weights   11/10/14 1513 11/11/14 0520  Weight: 56.7 kg (125 lb) 57.516 kg (126 lb 12.8 oz)    Intake/Output:     Intake/Output this shift:     Physical Exam: General: NAD  Head: Normocephalic, atraumatic. Moist oral mucosal membranes  Eyes: +opaque left eye  Neck: Supple, trachea midline  Lungs:  Clear to auscultation  Heart: Regular rate and rhythm  Abdomen:  Soft, nontender,   Extremities: no peripheral edema, left AKA  Neurologic: Nonfocal, moving all four extremities  Skin: No lesions  Access: Right arm AVF    Basic Metabolic Panel:  Recent Labs Lab 11/10/14 1544  NA 142  K 3.4*  CL 98*  CO2 30  GLUCOSE 108*  BUN 23*  CREATININE 5.17*  CALCIUM 8.0*    Liver Function Tests: No results for input(s): AST, ALT, ALKPHOS, BILITOT, PROT, ALBUMIN in the last 168 hours. No results for input(s): LIPASE, AMYLASE in the last 168 hours. No results for input(s): AMMONIA in the last 168 hours.  CBC:  Recent Labs Lab 11/10/14 1544  WBC 5.3  NEUTROABS 3.5  HGB 10.0*  HCT 31.4*  MCV 71.7*  PLT 329    Cardiac Enzymes:  Recent Labs Lab 11/10/14 1544 11/10/14 2014 11/11/14 0211  TROPONINI 0.28* 0.27* 0.24*    BNP: Invalid input(s): POCBNP  CBG:  Recent Labs Lab 11/10/14 2158 11/11/14 0726  GLUCAP 171* 101*    Microbiology: Results for  orders placed or performed during the hospital encounter of 11/10/14  Culture, blood (routine x 2) Call MD if unable to obtain prior to antibiotics being given     Status: None (Preliminary result)   Collection Time: 11/10/14  8:14 PM  Result Value Ref Range Status   Specimen Description BLOOD LEFT FATTY CASTS  Final   Special Requests BOTTLES DRAWN AEROBIC AND ANAEROBIC 3CC  Final   Culture NO GROWTH < 12 HOURS  Final   Report Status PENDING  Incomplete  Culture, blood (routine x 2) Call MD if unable to obtain prior to antibiotics being given     Status: None (Preliminary result)   Collection Time: 11/10/14  8:30 PM  Result Value Ref Range Status   Specimen Description BLOOD LEFT WRIST  Final   Special Requests BOTTLES DRAWN AEROBIC AND ANAEROBIC 4CC  Final   Culture NO GROWTH < 12 HOURS  Final   Report Status PENDING  Incomplete    Coagulation Studies: No results for input(s): LABPROT, INR in the last 72 hours.  Urinalysis: No results for input(s): COLORURINE, LABSPEC, PHURINE, GLUCOSEU, HGBUR, BILIRUBINUR, KETONESUR, PROTEINUR, UROBILINOGEN, NITRITE, LEUKOCYTESUR in the last 72 hours.  Invalid input(s): APPERANCEUR    Imaging: Dg Chest 2 View  11/10/2014   CLINICAL DATA:  80 year old male with 3 week history of cough.  EXAM: CHEST  2 VIEW  COMPARISON:  Chest x-ray 10/28/2014.  FINDINGS: There is cephalization of the pulmonary vasculature and slight indistinctness of the interstitial markings suggestive of mild pulmonary edema. Elevation of the left hemidiaphragm. Left perihilar prominence and slight asymmetry of airspace opacification, slightly improved compared to the prior examination. Small left pleural effusion. Heart size is upper limits of normal. Upper mediastinal contours are within normal limits. Atherosclerosis in the thoracic aorta.  IMPRESSION: 1. The overall appearance the chest suggests congestive heart failure, as above. 2. However, there is asymmetric left hilar  fullness, elevation of the left hemidiaphragm and prominent perihilar opacity in the left lower lobe. The possibility of a centrally obstructing lesion is not excluded, and correlation with contrast enhanced chest CT should be considered if there is any clinical correlation for underlying malignancy. 3. Atherosclerosis.   Electronically Signed   By: Vinnie Langton M.D.   On: 11/10/2014 15:40   Ct Chest Wo Contrast  11/10/2014   CLINICAL DATA:  Cough for 3 weeks, hypoxia  EXAM: CT CHEST WITHOUT CONTRAST  TECHNIQUE: Multidetector CT imaging of the chest was performed following the standard protocol without IV contrast.  COMPARISON:  Chest x-ray from earlier in the same day  FINDINGS: Bilateral pleural effusions are noted left greater than right. There are associated compressive atelectatic changes as well as some very patchy infiltrate in the posterior aspect of the upper lobes bilaterally. No focal parenchymal nodule is seen.  Scattered small hilar and mediastinal lymph nodes are identified with calcification consistent with prior granulomatous disease. The hilar fullness seen on recent chest x-ray is related which in part to the pleural effusion as well as crowding of the vascular markings and upper lobe infiltrate. No focal mass lesion is seen. Aortic calcifications are noted as well as coronary calcifications.  The visualized upper abdomen shows no acute abnormality. The bony structures show degenerative change of the thoracic spine.  IMPRESSION: Bilateral pleural effusions left greater than right.  Patchy upper lobe infiltrate bilaterally.  Changes of prior granulomatous disease.  No focal mass lesion is identified correspond with that seen on the recent chest x-ray.   Electronically Signed   By: Inez Catalina M.D.   On: 11/10/2014 17:55     Medications:     . aspirin EC  81 mg Oral Daily  . gabapentin  300 mg Oral TID  . heparin  5,000 Units Subcutaneous 3 times per day  . insulin aspart  0-5 Units  Subcutaneous QHS  . insulin aspart  0-9 Units Subcutaneous TID WC  . insulin glargine  10 Units Subcutaneous BH-q7a  . lisinopril  10 mg Oral Daily  . metoprolol  50 mg Oral BID  . multivitamin  1 tablet Oral Daily  . piperacillin-tazobactam (ZOSYN)  IV  3.375 g Intravenous Q12H  . sevelamer carbonate  800 mg Oral TID WC  . sodium chloride  3 mL Intravenous Q12H  . [START ON 11/12/2014] vancomycin  500 mg Intravenous Q M,W,F-HD   sodium chloride, albuterol, diphenhydrAMINE, guaiFENesin-codeine, sodium chloride  Assessment/ Plan:  [name] is a 80 y.o. black male with ESRD, on HD , Diabetes melllitus. PVD left AKA,   [facility]//[address]//MWF  1. ESRD - no acute indication for hemodialysis. Continue MWF schedule. Next treatment for later tomorrow. Orders prepared.   2. Hypertension: history of difficult to control - currently on lisinopril and metoprolol.   3. AOCKD: hemoglobin 10 - epo with treatment.  4. Secondary Hyperparathyroidism:  Phos from last admission of 4.9.  No PTH available.  - Continue sevelamer   LOS: 1 Analicia Skibinski 8/14/201610:16 AM

## 2014-11-11 NOTE — Progress Notes (Signed)
Attempted to get consents for thoracentesis and dialysis signed during start of shift, but patients step-daughter [facility] left.  Pt verbalized that he was aware of procedures.  Pt blind, will attempt to get signature tomorrow. [name]

## 2014-11-11 NOTE — Progress Notes (Signed)
ANTIBIOTIC CONSULT NOTE - FOLLOW   Pharmacy Consult for abx dosing (vancomycin and Zosyn) Indication: HCAP  No Known Allergies  Patient Measurements: Height: 5\' 6"  (167.6 cm) Weight: 126 lb 12.8 oz (57.516 kg) IBW/kg (Calculated) : 63.8 Adjusted Body Weight: 56.7kg  Vital Signs: Temp: 98.1 F (36.7 C) (08/14 0800) Temp Source: Oral (08/14 0800) BP: 154/69 mmHg (08/14 0800) Pulse Rate: 99 (08/14 0800) Intake/Output from previous day:   Intake/Output from this shift:    Labs:  Recent Labs  11/10/14 1544  WBC 5.3  HGB 10.0*  PLT 329  CREATININE 5.17*   Estimated Creatinine Clearance: 9.7 mL/min (by C-G formula based on Cr of 5.17). No results for input(s): VANCOTROUGH, VANCOPEAK, VANCORANDOM, GENTTROUGH, GENTPEAK, GENTRANDOM, TOBRATROUGH, TOBRAPEAK, TOBRARND, AMIKACINPEAK, AMIKACINTROU, AMIKACIN in the last 72 hours.   Microbiology: Recent Results (from the past 720 hour(s))  Blood Culture (routine x 2)     Status: None   Collection Time: 10/28/14  5:30 PM  Result Value Ref Range Status   Specimen Description BLOOD LEFT ASSIST CONTROL  Final   Special Requests   Final    BOTTLES DRAWN AEROBIC AND ANAEROBIC  AER 4CC ANA 2CC   Culture NO GROWTH 5 DAYS  Final   Report Status 11/02/2014 FINAL  Final  Blood Culture (routine x 2)     Status: None   Collection Time: 10/28/14  5:43 PM  Result Value Ref Range Status   Specimen Description BLOOD LEFT ARM  Final   Special Requests   Final    BOTTLES DRAWN AEROBIC AND ANAEROBIC  AER 4CC ANA 2CC   Culture NO GROWTH 5 DAYS  Final   Report Status 11/02/2014 FINAL  Final  MRSA PCR Screening     Status: None   Collection Time: 10/30/14  5:17 AM  Result Value Ref Range Status   MRSA by PCR NEGATIVE NEGATIVE Final    Comment:        The GeneXpert MRSA Assay (FDA approved for NASAL specimens only), is one component of a comprehensive MRSA colonization surveillance program. It is not intended to diagnose MRSA infection nor  to guide or monitor treatment for MRSA infections.   Culture, blood (routine x 2) Call MD if unable to obtain prior to antibiotics being given     Status: None (Preliminary result)   Collection Time: 11/10/14  8:14 PM  Result Value Ref Range Status   Specimen Description BLOOD LEFT FATTY CASTS  Final   Special Requests BOTTLES DRAWN AEROBIC AND ANAEROBIC 3CC  Final   Culture NO GROWTH < 12 HOURS  Final   Report Status PENDING  Incomplete  Culture, blood (routine x 2) Call MD if unable to obtain prior to antibiotics being given     Status: None (Preliminary result)   Collection Time: 11/10/14  8:30 PM  Result Value Ref Range Status   Specimen Description BLOOD LEFT WRIST  Final   Special Requests BOTTLES DRAWN AEROBIC AND ANAEROBIC 4CC  Final   Culture NO GROWTH < 12 HOURS  Final   Report Status PENDING  Incomplete    Medical History: Past Medical History  Diagnosis Date  . Diabetes mellitus without complication   . Glaucoma   . Chronic kidney disease   . Hypertension   . Peripheral vascular disease   . Anemia of chronic disease   . MGUS (monoclonal gammopathy of unknown significance)   . Renal cyst     Medications:   Assessment: Pan cx pending CXR: Bilateral  upper lobe infiltrate   Goal of Therapy:  Resolve infection.  Plan:  Zosyn 3.375 grams q 12 hours ordered.   Vancomycin 1250 mg total load dose ordered. 500 mg PRN ordered to be given at end of HD session. Level will need to be ordered before 3rd HD session.  8/14: Per previous nephrology note. Patient is on MWF dialysis schedule. Will schedule Vancomycin 500 mg for MWF. Will need to follow closely for any changes in regimen. I.e. Emergent dialysis needs. Random Vancomycin level ordered prior to what would likely be the 3rd hemodialysis session. Level ordered for Friday 8/19 @ 05:00.   Larene Beach, PharmD   11/11/2014,9:47 AM

## 2014-11-12 LAB — CBC
HEMATOCRIT: 29.8 % — AB (ref 40.0–52.0)
HEMOGLOBIN: 9.1 g/dL — AB (ref 13.0–18.0)
MCH: 21.8 pg — ABNORMAL LOW (ref 26.0–34.0)
MCHC: 30.5 g/dL — AB (ref 32.0–36.0)
MCV: 71.3 fL — ABNORMAL LOW (ref 80.0–100.0)
Platelets: 300 10*3/uL (ref 150–440)
RBC: 4.18 MIL/uL — AB (ref 4.40–5.90)
RDW: 22.8 % — ABNORMAL HIGH (ref 11.5–14.5)
WBC: 8.4 10*3/uL (ref 3.8–10.6)

## 2014-11-12 LAB — PHOSPHORUS: Phosphorus: 2.7 mg/dL (ref 2.5–4.6)

## 2014-11-12 LAB — RENAL FUNCTION PANEL
Albumin: 2.8 g/dL — ABNORMAL LOW (ref 3.5–5.0)
Anion gap: 14 (ref 5–15)
BUN: 46 mg/dL — ABNORMAL HIGH (ref 6–20)
CO2: 29 mmol/L (ref 22–32)
Calcium: 7.8 mg/dL — ABNORMAL LOW (ref 8.9–10.3)
Chloride: 96 mmol/L — ABNORMAL LOW (ref 101–111)
Creatinine, Ser: 8.13 mg/dL — ABNORMAL HIGH (ref 0.61–1.24)
GFR calc Af Amer: 6 mL/min — ABNORMAL LOW (ref >60)
GFR calc non Af Amer: 6 mL/min — ABNORMAL LOW (ref >60)
Glucose, Bld: 102 mg/dL — ABNORMAL HIGH (ref 65–99)
Phosphorus: 4.8 mg/dL — ABNORMAL HIGH (ref 2.5–4.6)
Potassium: 4 mmol/L (ref 3.5–5.1)
Sodium: 139 mmol/L (ref 135–145)

## 2014-11-12 LAB — BASIC METABOLIC PANEL
ANION GAP: 15 (ref 5–15)
BUN: 43 mg/dL — ABNORMAL HIGH (ref 6–20)
CHLORIDE: 98 mmol/L — AB (ref 101–111)
CO2: 29 mmol/L (ref 22–32)
CREATININE: 7.63 mg/dL — AB (ref 0.61–1.24)
Calcium: 7.9 mg/dL — ABNORMAL LOW (ref 8.9–10.3)
GFR calc non Af Amer: 6 mL/min — ABNORMAL LOW (ref >60)
GFR, EST AFRICAN AMERICAN: 7 mL/min — AB (ref >60)
Glucose, Bld: 67 mg/dL (ref 65–99)
POTASSIUM: 3.9 mmol/L (ref 3.5–5.1)
SODIUM: 142 mmol/L (ref 135–145)

## 2014-11-12 LAB — GLUCOSE, CAPILLARY
GLUCOSE-CAPILLARY: 53 mg/dL — AB (ref 65–99)
GLUCOSE-CAPILLARY: 68 mg/dL (ref 65–99)
GLUCOSE-CAPILLARY: 86 mg/dL (ref 65–99)
Glucose-Capillary: 87 mg/dL (ref 65–99)
Glucose-Capillary: 87 mg/dL (ref 65–99)
Glucose-Capillary: 113 mg/dL — ABNORMAL HIGH (ref 65–99)

## 2014-11-12 LAB — HIV ANTIBODY (ROUTINE TESTING W REFLEX): HIV Screen 4th Generation wRfx: NONREACTIVE

## 2014-11-12 MED ORDER — EPOETIN ALFA 10000 UNIT/ML IJ SOLN
10000.0000 [IU] | INTRAMUSCULAR | Status: DC
  Administered 2014-11-14: 10000 [IU] via INTRAVENOUS
  Filled 2014-11-12: qty 1

## 2014-11-12 MED ORDER — EPOETIN ALFA 10000 UNIT/ML IJ SOLN
10000.0000 [IU] | Freq: Once | INTRAMUSCULAR | Status: AC
  Administered 2014-11-12: 10000 [IU] via INTRAVENOUS

## 2014-11-12 NOTE — Care Management Note (Signed)
Case Management Note  Patient Details  Name: [name] MRN: [id number] Date of Birth: 1934-10-18  Subjective/Objective:        [facility] notified of this hospitalization.            Action/Plan:   Expected Discharge Date:                  Expected Discharge Plan:     In-House Referral:     Discharge planning Services     Post Acute Care Choice:    Choice offered to:     DME Arranged:    DME Agency:     HH Arranged:    [place] Agency:     Status of Service:     Medicare Important Message Given:  Yes-third notification given Date Medicare IM Given:    Medicare IM give by:    Date Additional Medicare IM Given:    Additional Medicare Important Message give by:     If discussed at [place] of Stay Meetings, dates discussed:    Additional Comments:  [place] Morren A, RN 11/12/2014, 9:03 AM

## 2014-11-12 NOTE — Care Management Note (Addendum)
Paul Management Note  Patient Details  Name: [name] MRN: [id number] Date of Birth: 1934/10/28  Subjective/Objective:       80yo [name] was admitted from home on 11/10/14 with pneumonia. PCP = Dr Benita Stabile. [facility]. AKA but reports that he does not use his prosthesis and prefers to use [name] wheelchair at home. Followed by the [facility] who transport [name] to his dialysis appointments every M-W-F at [facility] on [address]. Chronic 2L N/C at home supplied by [facility]. [name] reported to this Probation officer that he resides with his wife and nephew. Has [name] daughter who signs consents for him. [name] is Blind. Paul management will follow for discharge planning. Claudia Desanctis has been notified that [name] is currently in hospital. Scheduled for [name] left Thoracentesis today.               Action/Plan:   Expected Discharge Date:                  Expected Discharge Plan:     In-House Referral:     Discharge planning Services     Post Acute Care Choice:    Choice offered to:     DME Arranged:    DME Agency:     HH Arranged:    [place] Agency:     Status of Service:     Medicare Important Message Given:  Yes-third notification given Date Medicare IM Given:    Medicare IM give by:    Date Additional Medicare IM Given:    Additional Medicare Important Message give by:     If discussed at [place] of Stay Meetings, dates discussed:    Additional Comments:  Paul Anglin A, RN 11/12/2014, 10:14 AM

## 2014-11-12 NOTE — Progress Notes (Signed)
Pre-hd tx 

## 2014-11-12 NOTE — Progress Notes (Signed)
Post hd 

## 2014-11-12 NOTE — Evaluation (Signed)
Physical Therapy Evaluation Patient Details Name: [name] MRN: [id number] DOB: [birth year] Today's Date: 11/12/2014   History of Present Illness  [name] is a 80 y.o. male with a known history of hypertension, diabetes, ESRD on hemodialysis and anemia of chronic disease. The patient was sent to the ED due to cough and shortness of breath. Patient was treated with antibiotics for pneumonia 2 weeks ago. But the patient still has shortness of breath and cough. Patient was found hypoxia his O2 saturation at 80s. Patient got CAT scan of chest in ED, which showed bilateral upper pneumonia, congestion and granuloma changes. Dr. Jacqualine Code discussed with Dr. Stevenson Clinch, we will suggest to start vancomycin and Zosyn for treatment of pneumonia. Since he has elevated troponin, he was treated with aspirin in ED. The patient was also treated with Lasix 80 mg IV in ED. History obtained from patient as well a son. Pt does not ambulate and has not ambulated in a long time. He performs squat pivot transfers from wheelchair to other surfaces. Son states that pt has had "a couple" falls over the last y ear  Clinical Impression  Pt confirms he is at baseline mobility currently. Practiced squat pivot transfers with patient to recliner and back to bed and pt is modified independent. Pt does not ambulate at home and has 24/7 assistance. He has a prosthesis for L AKA but not use. Family reports pt has not ambulated, "in forever." Pt is at baseline level of function and has no need for PT at this time. He has 24/7 assistance at home and will be safe to return when medically stable.     Follow Up Recommendations No PT follow up;Supervision/Assistance - 24 hour    Equipment Recommendations  None recommended by PT    Recommendations for Other Services       Precautions / Restrictions Precautions Precautions: Fall Restrictions Weight Bearing Restrictions: No      Mobility  Bed Mobility Overal bed mobility: Modified  Independent             General bed mobility comments: HOB flat, use of bed rails. Pt reports rails on bed at home. Sequencing and speed are function. Pt needs assistance with set-up due to the fact that he is legally blink  Transfers Overall transfer level: Needs assistance Equipment used: Standard walker Transfers: Sit to/from Stand Sit to Stand: Min assist         General transfer comment: Pt able to come from sitting to standing with walker but cues for set-up and sequencing due to visual impairment. Pt demonstrates decreased balance with posterior leaning requiring min to modA+1 to remain upright. Pt able to performs squat pivot transfer from bed to recliner and back with modified independence. At home pt performs squat pivot transfers between surfaces.  Ambulation/Gait             General Gait Details: Unable to truly ambulate at this time. Pt able to perform some limited hopping at EOB but with significant posterior LOB  Stairs            Wheelchair Mobility    Modified Rankin (Stroke Patients Only)       Balance Overall balance assessment: Needs assistance   Sitting balance-Leahy Scale: Good       Standing balance-Leahy Scale: Poor                               Pertinent Vitals/Pain  Pain Assessment: No/denies pain    Home Living Family/patient expects to be discharged to:: Private residence Living Arrangements: Spouse/significant other;Other relatives (Wife's nephew) Available Help at Discharge: Family;Available 24 hours/day Type of Home: House Home Access: Ramped entrance     Home Layout: One level Home Equipment: Walker - 2 wheels;Wheelchair - manual (No BSC, shower chair, or grab bars)      Prior Function Level of Independence: Needs assistance               Hand Dominance        Extremity/Trunk Assessment   Upper Extremity Assessment: Generalized weakness           Lower Extremity Assessment:  Generalized weakness (L AKA,)         Communication   Communication: HOH  Cognition Arousal/Alertness: Awake/alert Behavior During Therapy: WFL for tasks assessed/performed Overall Cognitive Status: No family/caregiver present to determine baseline cognitive functioning (Appears to have some baseline confusion. AOx2 today)                      General Comments      Exercises        Assessment/Plan    PT Assessment Patent does not need any further PT services  PT Diagnosis     PT Problem List    PT Treatment Interventions     PT Goals (Current goals can be found in the Care Plan section) Acute Rehab PT Goals Patient Stated Goal: "I want to get home" PT Goal Formulation: With patient Time For Goal Achievement: 11/26/14 Potential to Achieve Goals: Good    Frequency     Barriers to discharge        Co-evaluation               End of Session Equipment Utilized During Treatment: Gait belt Activity Tolerance: Patient tolerated treatment well Patient left: in bed;with call bell/phone within reach;with bed alarm set Nurse Communication: Mobility status         Time: 1628-1700 PT Time Calculation (min) (ACUTE ONLY): 32 min   Charges:   PT Evaluation $Initial PT Evaluation Tier I: 1 Procedure PT Treatments $Therapeutic Activity: 8-22 mins   PT G Codes:       Lyndel Safe Huprich PT, DPT   Huprich,Jason 11/12/2014, 5:26 PM

## 2014-11-12 NOTE — Progress Notes (Signed)
[facility]  ROUNDING NOTE   Subjective:  Pt seen at bedside.  Due for HD today.  No acute complaints.    Objective:  Vital signs in last 24 hours:  Temp:  [97.6 F (36.4 C)-99.9 F (37.7 C)] 98.2 F (36.8 C) (08/15 1515) Pulse Rate:  [71-106] 74 (08/15 1515) Resp:  [18-21] 19 (08/15 1515) BP: (120-154)/(57-103) 136/67 mmHg (08/15 1515) SpO2:  [91 %-100 %] 99 % (08/15 1515) Weight:  [57.6 kg (126 lb 15.8 oz)-58.5 kg (128 lb 15.5 oz)] 57.6 kg (126 lb 15.8 oz) (08/15 1515)  Weight change:  Filed Weights   11/11/14 0520 11/12/14 1155 11/12/14 1515  Weight: 57.516 kg (126 lb 12.8 oz) 58.5 kg (128 lb 15.5 oz) 57.6 kg (126 lb 15.8 oz)    Intake/Output: I/O last 3 completed shifts: In: 240 [P.O.:240] Out: 0    Intake/Output this shift:  Total I/O In: 100 [IV Piggyback:100] Out: 1000 [Other:1000]  Physical Exam: General: NAD  Head: Normocephalic, atraumatic. Moist oral mucosal membranes  Eyes: +opaque left eye  Neck: Supple, trachea midline  Lungs:  Clear to auscultation normal effort  Heart: Regular rate and rhythm  Abdomen:  Soft, nontender, BS present  Extremities: no peripheral edema, left AKA  Neurologic: Nonfocal, moving all four extremities  Skin: No lesions  Access: Right arm AVF    Basic Metabolic Panel:  Recent Labs Lab 11/10/14 1544 11/12/14 0549 11/12/14 1253 11/12/14 1254  NA 142 142 139  --   K 3.4* 3.9 4.0  --   CL 98* 98* 96*  --   CO2 30 29 29   --   GLUCOSE 108* 67 102*  --   BUN 23* 43* 46*  --   CREATININE 5.17* 7.63* 8.13*  --   CALCIUM 8.0* 7.9* 7.8*  --   PHOS  --   --  4.8* 2.7    Liver Function Tests:  Recent Labs Lab 11/12/14 1253  ALBUMIN 2.8*   No results for input(s): LIPASE, AMYLASE in the last 168 hours. No results for input(s): AMMONIA in the last 168 hours.  CBC:  Recent Labs Lab 11/10/14 1544 11/12/14 0549  WBC 5.3 8.4  NEUTROABS 3.5  --   HGB 10.0* 9.1*  HCT 31.4* 29.8*  MCV 71.7* 71.3*   PLT 329 300    Cardiac Enzymes:  Recent Labs Lab 11/10/14 1544 11/10/14 2014 11/11/14 0211  TROPONINI 0.28* 0.27* 0.24*    BNP: Invalid input(s): POCBNP  CBG:  Recent Labs Lab 11/12/14 0727 11/12/14 0728 11/12/14 0844 11/12/14 1107 11/12/14 1606  GLUCAP 53* 68 86 87 87    Microbiology: Results for orders placed or performed during the hospital encounter of 11/10/14  Culture, blood (routine x 2) Call MD if unable to obtain prior to antibiotics being given     Status: None (Preliminary result)   Collection Time: 11/10/14  8:14 PM  Result Value Ref Range Status   Specimen Description BLOOD LEFT FATTY CASTS  Final   Special Requests BOTTLES DRAWN AEROBIC AND ANAEROBIC 3CC  Final   Culture NO GROWTH 2 DAYS  Final   Report Status PENDING  Incomplete  Culture, blood (routine x 2) Call MD if unable to obtain prior to antibiotics being given     Status: None (Preliminary result)   Collection Time: 11/10/14  8:30 PM  Result Value Ref Range Status   Specimen Description BLOOD LEFT WRIST  Final   Special Requests BOTTLES DRAWN AEROBIC AND ANAEROBIC 4CC  Final  Culture NO GROWTH 2 DAYS  Final   Report Status PENDING  Incomplete    Coagulation Studies: No results for input(s): LABPROT, INR in the last 72 hours.  Urinalysis: No results for input(s): COLORURINE, LABSPEC, PHURINE, GLUCOSEU, HGBUR, BILIRUBINUR, KETONESUR, PROTEINUR, UROBILINOGEN, NITRITE, LEUKOCYTESUR in the last 72 hours.  Invalid input(s): APPERANCEUR    Imaging: No results found.   Medications:     . aspirin EC  81 mg Oral Daily  . gabapentin  300 mg Oral TID  . heparin  5,000 Units Subcutaneous 3 times per day  . insulin aspart  0-5 Units Subcutaneous QHS  . insulin aspart  0-9 Units Subcutaneous TID WC  . insulin glargine  10 Units Subcutaneous BH-q7a  . lisinopril  10 mg Oral Daily  . metoprolol  50 mg Oral BID  . multivitamin  1 tablet Oral Daily  . piperacillin-tazobactam (ZOSYN)  IV   3.375 g Intravenous Q12H  . sevelamer carbonate  800 mg Oral TID WC  . sodium chloride  3 mL Intravenous Q12H  . vancomycin  500 mg Intravenous Q M,W,F-HD   sodium chloride, albuterol, diphenhydrAMINE, guaiFENesin-codeine, ondansetron (ZOFRAN) IV, sodium chloride  Assessment/ Plan:  Mr. Chananya Canizalez is a 80 y.o. black male with ESRD, on HD , Diabetes melllitus. PVD left AKA,   [facility]//[address]//MWF  1. ESRD on HD MWF: Pt due for HD today, orders prepared, continue HD on MWF schedule.  2. Hypertension: BP acceptable today, 136/67, continue lisinopril/metoprolol.   3. AOCKD:hgb currenty 9.1, will start pt on epogen 10000 units IV with HD.  4. Secondary Hyperparathyroidism:  Continue renvela 800mg  po tid/wm, phos 2.7.  LOS: 2 Kevork Joyce 8/15/20165:53 PM

## 2014-11-12 NOTE — Progress Notes (Addendum)
[place] at [place] NAME: [name]    MR#:  [id number]  DATE OF BIRTH:  Jun 02, 1934  SUBJECTIVE:  Feels ok, waiting for thoracentesis this am. Sugar dropped in 60s this am  REVIEW OF SYSTEMS:  CONSTITUTIONAL: No fever, fatigue or weakness.  EYES: Patient is legally blind EARS, NOSE, AND THROAT: No tinnitus or ear pain.  RESPIRATORY:  + for cough, shortness of breath but is improving, No wheezing or hemoptysis.  CARDIOVASCULAR: No chest pain, orthopnea, edema.  GASTROINTESTINAL: No nausea, vomiting, diarrhea or abdominal pain.  GENITOURINARY: No dysuria, hematuria.  ENDOCRINE: No polyuria, nocturia,  HEMATOLOGY: No anemia, easy bruising or bleeding SKIN: No rash or lesion. MUSCULOSKELETAL: No joint pain or arthritis.   NEUROLOGIC: No tingling, numbness, weakness.  PSYCHIATRY: No anxiety or depression.  DRUG ALLERGIES:  No Known Allergies VITALS:  Blood pressure 150/64, pulse 83, temperature 99.9 F (37.7 C), temperature source Oral, resp. rate 20, height 5\' 6"  (1.676 m), weight 57.516 kg (126 lb 12.8 oz), SpO2 100 %. PHYSICAL EXAMINATION:  GENERAL:  80 y.o.-year-old patient lying in the bed with no acute distress.  EYES: Patient is legally blind No scleral icterus.  HEENT: Head atraumatic, normocephalic. Oropharynx and nasopharynx clear.  NECK:  Supple, no jugular venous distention. No thyroid enlargement, no tenderness.  LUNGS: Decreased breath sounds bilaterally, moderate air entry , positive crackles, no wheezing, rales,rhonchi . No use of accessory muscles of respiration.  CARDIOVASCULAR: S1, S2 normal. No murmurs, rubs, or gallops.  ABDOMEN: Soft, nontender, nondistended. Bowel sounds present. No organomegaly or mass.  EXTREMITIES: No pedal edema, cyanosis, or clubbing.  NEUROLOGIC: Cranial nerves II through XII are intact. Muscle strength 5/5 in all extremities. Sensation intact. Gait not checked.  PSYCHIATRIC: The patient  is alert and oriented x 3.  SKIN: No obvious rash, lesion, or ulcer.  LABORATORY PANEL:   CBC  Recent Labs Lab 11/12/14 0549  WBC 8.4  HGB 9.1*  HCT 29.8*  PLT 300   ------------------------------------------------------------------------------------------------------------------  Chemistries   Recent Labs Lab 11/12/14 0549  NA 142  K 3.9  CL 98*  CO2 29  GLUCOSE 67  BUN 43*  CREATININE 7.63*  CALCIUM 7.9*   ------------------------------------------------------------------------------------------------------------------  Cardiac Enzymes  Recent Labs Lab 11/11/14 0211  TROPONINI 0.24*   ------------------------------------------------------------------------------------------------------------------  RADIOLOGY:  Dg Chest 2 View  11/10/2014   CLINICAL DATA:  80 year old male with 3 week history of cough.  EXAM: CHEST  2 VIEW  COMPARISON:  Chest x-ray 10/28/2014.  FINDINGS: There is cephalization of the pulmonary vasculature and slight indistinctness of the interstitial markings suggestive of mild pulmonary edema. Elevation of the left hemidiaphragm. Left perihilar prominence and slight asymmetry of airspace opacification, slightly improved compared to the prior examination. Small left pleural effusion. Heart size is upper limits of normal. Upper mediastinal contours are within normal limits. Atherosclerosis in the thoracic aorta.  IMPRESSION: 1. The overall appearance the chest suggests congestive heart failure, as above. 2. However, there is asymmetric left hilar fullness, elevation of the left hemidiaphragm and prominent perihilar opacity in the left lower lobe. The possibility of a centrally obstructing lesion is not excluded, and correlation with contrast enhanced chest CT should be considered if there is any clinical correlation for underlying malignancy. 3. Atherosclerosis.   Electronically Signed   By: Vinnie Langton M.D.   On: 11/10/2014 15:40   Ct Chest Wo  Contrast  11/10/2014   CLINICAL DATA:  Cough for 3 weeks,  hypoxia  EXAM: CT CHEST WITHOUT CONTRAST  TECHNIQUE: Multidetector CT imaging of the chest was performed following the standard protocol without IV contrast.  COMPARISON:  Chest x-ray from earlier in the same day  FINDINGS: Bilateral pleural effusions are noted left greater than right. There are associated compressive atelectatic changes as well as some very patchy infiltrate in the posterior aspect of the upper lobes bilaterally. No focal parenchymal nodule is seen.  Scattered small hilar and mediastinal lymph nodes are identified with calcification consistent with prior granulomatous disease. The hilar fullness seen on recent chest x-ray is related which in part to the pleural effusion as well as crowding of the vascular markings and upper lobe infiltrate. No focal mass lesion is seen. Aortic calcifications are noted as well as coronary calcifications.  The visualized upper abdomen shows no acute abnormality. The bony structures show degenerative change of the thoracic spine.  IMPRESSION: Bilateral pleural effusions left greater than right.  Patchy upper lobe infiltrate bilaterally.  Changes of prior granulomatous disease.  No focal mass lesion is identified correspond with that seen on the recent chest x-ray.   Electronically Signed   By: Inez Catalina M.D.   On: 11/10/2014 17:55   ASSESSMENT AND PLAN:   1. Bilateral pneumonia with hypoxia and bilateral pleural effusions with past medical history of Klebsiella Pneumonia and Chyrseobacterium Continue IV antibiotics Zosyn and vancomycin Follow-up blood culture, sputum culture and a CBC. Await left-sided thoracentesis scheduled for today  2.Fluid overload with history of end-stage renal disease on hemodialysis  3.Elevated troponin, possible due to demand ischemia  ruled out acute coronary syndrome Continue aspirin and follow-up with cardiology consult  4. ESRD on hemodialysis Nephrology is  consulted, will continue hemodialysis on Monday, Wednesday and Friday per them  5. History of diabetes mellitus Will provide him diabetic diet Sliding scale insulin Continue Lantus and titrate dose as needed Sugar dropped 68 this am as he was NPO, improved with Apple juice.  Discontinue tele  PT eval   All the records are reviewed and case discussed with Care Management/Social Worker. Management plans discussed with the patient, family and they are in agreement.  CODE STATUS: Full code  TOTAL TIME TAKING CARE OF THIS PATIENT: 35 minutes.   POSSIBLE D/C IN 1-2 DAYS , DEPENDING ON CLINICAL CONDITION.   [facility], Jamisyn Langer M.D on 11/12/2014 at 11:32 AM  Between 7am to 6pm - Pager - [phone] After 6pm go to [email or web address] - password [email or web address] [facility]  Office  [phone]  CC: Primary care physician; Rinaldo Cloud, MD

## 2014-11-12 NOTE — Progress Notes (Addendum)
Pt will transfer to room 136, Pt is alert and oriented, confused at times. Stable at this time. Pt and daughter [name] notified.  Report given to [facility], [place]

## 2014-11-12 NOTE — Progress Notes (Signed)
Hemodialysis treatment start 

## 2014-11-12 NOTE — Progress Notes (Signed)
ANTIBIOTIC CONSULT NOTE - FOLLOW   Pharmacy Consult for abx dosing (vancomycin and Zosyn) Indication: HCAP  No Known Allergies  Patient Measurements: Height: 5\' 6"  (167.6 cm) Weight: 126 lb 15.8 oz (57.6 kg) IBW/kg (Calculated) : 63.8 Adjusted Body Weight: 56.7kg  Vital Signs: Temp: 98.2 F (36.8 C) (08/15 1515) Temp Source: Oral (08/15 1515) BP: 136/67 mmHg (08/15 1515) Pulse Rate: 74 (08/15 1515)  Labs:  Recent Labs  11/10/14 1544 11/12/14 0549 11/12/14 1253  WBC 5.3 8.4  --   HGB 10.0* 9.1*  --   PLT 329 300  --   CREATININE 5.17* 7.63* 8.13*   Estimated Creatinine Clearance: 6.2 mL/min (by C-G formula based on Cr of 8.13). No results for input(s): VANCOTROUGH, VANCOPEAK, VANCORANDOM, GENTTROUGH, GENTPEAK, GENTRANDOM, TOBRATROUGH, TOBRAPEAK, TOBRARND, AMIKACINPEAK, AMIKACINTROU, AMIKACIN in the last 72 hours.   Microbiology: Recent Results (from the past 720 hour(s))  Blood Culture (routine x 2)     Status: None   Collection Time: 10/28/14  5:30 PM  Result Value Ref Range Status   Specimen Description BLOOD LEFT ASSIST CONTROL  Final   Special Requests   Final    BOTTLES DRAWN AEROBIC AND ANAEROBIC  AER 4CC ANA 2CC   Culture NO GROWTH 5 DAYS  Final   Report Status 11/02/2014 FINAL  Final  Blood Culture (routine x 2)     Status: None   Collection Time: 10/28/14  5:43 PM  Result Value Ref Range Status   Specimen Description BLOOD LEFT ARM  Final   Special Requests   Final    BOTTLES DRAWN AEROBIC AND ANAEROBIC  AER 4CC ANA 2CC   Culture NO GROWTH 5 DAYS  Final   Report Status 11/02/2014 FINAL  Final  MRSA PCR Screening     Status: None   Collection Time: 10/30/14  5:17 AM  Result Value Ref Range Status   MRSA by PCR NEGATIVE NEGATIVE Final    Comment:        The GeneXpert MRSA Assay (FDA approved for NASAL specimens only), is one component of a comprehensive MRSA colonization surveillance program. It is not intended to diagnose MRSA infection nor to  guide or monitor treatment for MRSA infections.   Culture, blood (routine x 2) Call MD if unable to obtain prior to antibiotics being given     Status: None (Preliminary result)   Collection Time: 11/10/14  8:14 PM  Result Value Ref Range Status   Specimen Description BLOOD LEFT FATTY CASTS  Final   Special Requests BOTTLES DRAWN AEROBIC AND ANAEROBIC 3CC  Final   Culture NO GROWTH 2 DAYS  Final   Report Status PENDING  Incomplete  Culture, blood (routine x 2) Call MD if unable to obtain prior to antibiotics being given     Status: None (Preliminary result)   Collection Time: 11/10/14  8:30 PM  Result Value Ref Range Status   Specimen Description BLOOD LEFT WRIST  Final   Special Requests BOTTLES DRAWN AEROBIC AND ANAEROBIC 4CC  Final   Culture NO GROWTH 2 DAYS  Final   Report Status PENDING  Incomplete    Medical History: Past Medical History  Diagnosis Date  . Diabetes mellitus without complication   . Glaucoma   . Chronic kidney disease   . Hypertension   . Peripheral vascular disease   . Anemia of chronic disease   . MGUS (monoclonal gammopathy of unknown significance)   . Renal cyst     Assessment: Pharmacy consulted to dose  vancomycin and zosyn for PNA in this 80 year old male with ESRD on HD.   Goal of Therapy:  Resolve infection. Pre-HD vancomycin trough: 15-37mcg/ml  Plan:  Zosyn 3.375 grams q 12 hours ordered.   Vancomycin 1250 mg total load dose ordered. 500 mg PRN ordered to be given at end of HD session. Level will need to be ordered before 3rd HD session.  8/14: Per previous nephrology note. Patient is on MWF dialysis schedule. Will schedule Vancomycin 500 mg for MWF.  Will need to follow closely for any changes in regimen. I.e. Emergent dialysis needs. Random Vancomycin level ordered prior to what would likely be the 3rd hemodialysis session. Level ordered for Friday 8/19 @ 05:00.   Rexene Edison, PharmD Clinical Pharmacist   11/12/2014,4:34 PM

## 2014-11-12 NOTE — Progress Notes (Signed)
Pt assessed by respiratory and given breathing treatment.  Respiratory bumped oxygen up to 4 L due to low O2 reading on 1 L- low 80's/breathing slightly labored.  Will continue to assess and wean as able.   Jessee Avers

## 2014-11-12 NOTE — Progress Notes (Signed)
Hemodialysis treatment completed.

## 2014-11-13 ENCOUNTER — Ambulatory Visit: Payer: Medicare (Managed Care)

## 2014-11-13 ENCOUNTER — Inpatient Hospital Stay: Payer: Medicare (Managed Care)

## 2014-11-13 DIAGNOSIS — J189 Pneumonia, unspecified organism: Secondary | ICD-10-CM

## 2014-11-13 LAB — AMYLASE, BODY FLUID: AMYLASE FL: 23 U/L

## 2014-11-13 LAB — BASIC METABOLIC PANEL
ANION GAP: 17 — AB (ref 5–15)
BUN: 29 mg/dL — ABNORMAL HIGH (ref 6–20)
CALCIUM: 8.1 mg/dL — AB (ref 8.9–10.3)
CO2: 28 mmol/L (ref 22–32)
Chloride: 96 mmol/L — ABNORMAL LOW (ref 101–111)
Creatinine, Ser: 5.68 mg/dL — ABNORMAL HIGH (ref 0.61–1.24)
GFR calc Af Amer: 10 mL/min — ABNORMAL LOW (ref >60)
GFR, EST NON AFRICAN AMERICAN: 9 mL/min — AB (ref >60)
GLUCOSE: 233 mg/dL — AB (ref 65–99)
Potassium: 4.3 mmol/L (ref 3.5–5.1)
Sodium: 141 mmol/L (ref 135–145)

## 2014-11-13 LAB — CBC
HCT: 32.8 % — ABNORMAL LOW (ref 40.0–52.0)
Hemoglobin: 9.8 g/dL — ABNORMAL LOW (ref 13.0–18.0)
MCH: 21.6 pg — ABNORMAL LOW (ref 26.0–34.0)
MCHC: 30 g/dL — ABNORMAL LOW (ref 32.0–36.0)
MCV: 72.3 fL — AB (ref 80.0–100.0)
PLATELETS: 296 10*3/uL (ref 150–440)
RBC: 4.53 MIL/uL (ref 4.40–5.90)
RDW: 23.2 % — AB (ref 11.5–14.5)
WBC: 5.4 10*3/uL (ref 3.8–10.6)

## 2014-11-13 LAB — GLUCOSE, CAPILLARY
GLUCOSE-CAPILLARY: 62 mg/dL — AB (ref 65–99)
Glucose-Capillary: 179 mg/dL — ABNORMAL HIGH (ref 65–99)
Glucose-Capillary: 136 mg/dL — ABNORMAL HIGH (ref 65–99)
Glucose-Capillary: 61 mg/dL — ABNORMAL LOW (ref 65–99)
Glucose-Capillary: 110 mg/dL — ABNORMAL HIGH (ref 65–99)

## 2014-11-13 LAB — LACTATE DEHYDROGENASE, PLEURAL OR PERITONEAL FLUID: LD, Fluid: 112 U/L — ABNORMAL HIGH (ref 3–23)

## 2014-11-13 LAB — PROTEIN, BODY FLUID

## 2014-11-13 LAB — PARATHYROID HORMONE, INTACT (NO CA): PTH: 452 pg/mL — AB (ref 15–65)

## 2014-11-13 LAB — BODY FLUID CELL COUNT WITH DIFFERENTIAL
LYMPHS FL: 39 %
Monocyte-Macrophage-Serous Fluid: 8 %
Neutrophil Count, Fluid: 53 %
Total Nucleated Cell Count, Fluid: 140 cu mm

## 2014-11-13 NOTE — Progress Notes (Signed)
Blood sugar 61 apple  juice given. Retaken  Blood sugar 62 more juice given with sugar patient eating a [place] sandwich. Currently asymptomatic.

## 2014-11-13 NOTE — Progress Notes (Signed)
Pt is suppose to have Thorencentisis today and heparin is ordered for this morning. Spoke with Dr. Lavetta Nielsen, may hold morning heparin dose and pt can be npo.

## 2014-11-13 NOTE — Care Management (Signed)
Received call from [facility] at [facility] [phone] requesting update since only MDs at [facility] have access to patient on Epic. Caryl Pina updated that patient was tapped today and cultures sent. Patient now on baseline O2 of 2L/[place].

## 2014-11-13 NOTE — Care Management Note (Signed)
Patient is active at [facility] on MWF schedule.  Will follow send all updated records to clinic at discharge. Clinic notified of admission on 11/12/14 [facility] [phone]

## 2014-11-13 NOTE — Progress Notes (Signed)
[place] at [place] NAME: [name]    MR#:  [id number]  DATE OF BIRTH:  November 06, 1934  SUBJECTIVE:  Feeling okay, s/p thoracentesis and removal of 1.6 L of fluid REVIEW OF SYSTEMS:  CONSTITUTIONAL: No fever, fatigue or weakness.  EYES: Patient is legally blind EARS, NOSE, AND THROAT: No tinnitus or ear pain.  RESPIRATORY:  + for cough, shortness of breath but is improving, No wheezing or hemoptysis.  CARDIOVASCULAR: No chest pain, orthopnea, edema.  GASTROINTESTINAL: No nausea, vomiting, diarrhea or abdominal pain.  GENITOURINARY: No dysuria, hematuria.  ENDOCRINE: No polyuria, nocturia,  HEMATOLOGY: No anemia, easy bruising or bleeding SKIN: No rash or lesion. MUSCULOSKELETAL: No joint pain or arthritis.   NEUROLOGIC: No tingling, numbness, weakness.  PSYCHIATRY: No anxiety or depression.  DRUG ALLERGIES:  No Known Allergies VITALS:  Blood pressure 126/62, pulse 79, temperature 98.8 F (37.1 C), temperature source Oral, resp. rate 18, height 5\' 6"  (1.676 m), weight 57.6 kg (126 lb 15.8 oz), SpO2 97 %. PHYSICAL EXAMINATION:  GENERAL:  80 y.o.-year-old patient lying in the bed with no acute distress.  EYES: Patient is legally blind No scleral icterus.  HEENT: Head atraumatic, normocephalic. Oropharynx and nasopharynx clear.  NECK:  Supple, no jugular venous distention. No thyroid enlargement, no tenderness.  LUNGS: Decreased breath sounds bilaterally, moderate air entry , positive crackles, no wheezing, rales,rhonchi . No use of accessory muscles of respiration.  CARDIOVASCULAR: S1, S2 normal. No murmurs, rubs, or gallops.  ABDOMEN: Soft, nontender, nondistended. Bowel sounds present. No organomegaly or mass.  EXTREMITIES: No pedal edema, cyanosis, or clubbing.  NEUROLOGIC: Cranial nerves II through XII are intact. Muscle strength 5/5 in all extremities. Sensation intact. Gait not checked.  PSYCHIATRIC: The patient is alert and  oriented x 3.  SKIN: No obvious rash, lesion, or ulcer.  LABORATORY PANEL:   CBC  Recent Labs Lab 11/13/14 0417  WBC 5.4  HGB 9.8*  HCT 32.8*  PLT 296   ------------------------------------------------------------------------------------------------------------------  Chemistries   Recent Labs Lab 11/13/14 0417  NA 141  K 4.3  CL 96*  CO2 28  GLUCOSE 233*  BUN 29*  CREATININE 5.68*  CALCIUM 8.1*   ------------------------------------------------------------------------------------------------------------------  Cardiac Enzymes  Recent Labs Lab 11/11/14 0211  TROPONINI 0.24*   ------------------------------------------------------------------------------------------------------------------  RADIOLOGY:  Dg Chest 2 View  11/13/2014   CLINICAL DATA:  Status post left thoracentesis  EXAM: CHEST - 2 VIEW  COMPARISON:  11/10/2014  FINDINGS: There is been resolution of left-sided pleural effusion. No pneumothorax is noted. Some patchy changes are noted in the right lung base which may represent some early infiltrate. No bony abnormality is noted.  IMPRESSION: No pneumothorax following left thoracentesis.   Electronically Signed   By: Inez Catalina M.D.   On: 11/13/2014 11:07   US Thoracentesis Asp Pleural Space W/img Guide  11/13/2014   CLINICAL DATA:  Bilateral pleural effusions left greater than right  EXAM: ULTRASOUND GUIDED left THORACENTESIS  PROCEDURE: An ultrasound guided thoracentesis was thoroughly discussed with the patient and questions answered. The benefits, risks, alternatives and complications were also discussed. The patient understands and wishes to proceed with the procedure. Written consent was obtained.  Ultrasound was performed to localize and mark an adequate pocket of fluid in the left chest. The area was then prepped and draped in the normal sterile fashion. 1% Lidocaine was used for local anesthesia. Under ultrasound guidance a 6 French thoracentesis  catheter was introduced. Thoracentesis was performed.  The catheter was removed and a dressing applied.  COMPLICATIONS: None  FINDINGS: A total of approximately 1.4 L of clear yellow fluid was removed. A fluid sample was sent for laboratory analysis.  IMPRESSION: Successful ultrasound guided left thoracentesis yielding 1.4 L of pleural fluid.   Electronically Signed   By: Inez Catalina M.D.   On: 11/13/2014 11:06   ASSESSMENT AND PLAN:   1. Bilateral pneumonia with hypoxia and bilateral pleural effusions with past medical history of Klebsiella Pneumonia and Chyrseobacterium Continue IV antibiotics Zosyn and vancomycin Status post left-sided thoracentesis with removal of 1.6 L of fluid  2.Fluid overload with history of end-stage renal disease on hemodialysis  3.Elevated troponin, possible due to demand ischemia  ruled out acute coronary syndrome Continue aspirin and follow-up with cardiology consult  4. ESRD on hemodialysis Nephrology is consulted, will continue hemodialysis on Monday, Wednesday and Friday per them  5. History of diabetes mellitus Continue diabetic diet Stop Lantus  PT eval - home Supervision/Assistance - 24 hour  All the records are reviewed and case discussed with Care Management/Social Worker. Management plans discussed with the patient, family and they are in agreement.  CODE STATUS: Full code  TOTAL TIME TAKING CARE OF THIS PATIENT: 35 minutes.   POSSIBLE D/C IN 1-2 DAYS , DEPENDING ON CLINICAL CONDITION.   [facility], Trudee Chirino M.D on 11/13/2014 at 4:50 PM  Between 7am to 6pm - Pager - [phone] After 6pm go to [email or web address] - password [email or web address] [facility]  Office  [phone]  CC: Primary care physician; Rinaldo Cloud, MD

## 2014-11-13 NOTE — Progress Notes (Signed)
PULMONARY / CRITICAL CARE MEDICINE   Name: [name] MRN: [id number] DOB: 05-09-1934    ADMISSION DATE:  11/10/2014 CONSULTATION DATE:  11/11/14  REFERRING MD :  Dr. Bridgett Larsson   CHIEF COMPLAINT:    Cough and Short of breath   HISTORY OF PRESENT ILLNESS   Resting comfortably today, minimal oxygen use, No apparent resp distress S/p HD yesterday  S/p thoracentesis 1.4 L removed PAST MEDICAL HISTORY    :  Past Medical History  Diagnosis Date  . Diabetes mellitus without complication   . Glaucoma   . Chronic kidney disease   . Hypertension   . Peripheral vascular disease   . Anemia of chronic disease   . MGUS (monoclonal gammopathy of unknown significance)   . Renal cyst    Past Surgical History  Procedure Laterality Date  . Leg amputation above knee  left  . Prostatectomy     Prior to Admission medications   Medication Sig Start Date End Date Taking? Authorizing Provider  albuterol (PROVENTIL HFA;VENTOLIN HFA) 108 (90 BASE) MCG/ACT inhaler Inhale 2 puffs into the lungs every 6 (six) hours as needed for wheezing or shortness of breath. 10/31/14  Yes Nicholes Mango, MD  aspirin EC 81 MG tablet Take 81 mg by mouth daily.   Yes Historical Provider, MD  cefdinir (OMNICEF) 300 MG capsule Take 1 capsule (300 mg total) by mouth 2 (two) times daily. 10/31/14  Yes Nicholes Mango, MD  Cholecalciferol (VITAMIN D3) 50000 UNITS CAPS Take 50,000 Units by mouth every 30 (thirty) days.   Yes Historical Provider, MD  gabapentin (NEURONTIN) 300 MG capsule Take 300 mg by mouth 3 (three) times daily.   Yes Historical Provider, MD  insulin glargine (LANTUS) 100 UNIT/ML injection Inject 10 Units into the skin every morning.   Yes Historical Provider, MD  Iron-Vitamin C (VITRON-C PO) Take 1 tablet by mouth daily.   Yes Historical Provider, MD  lisinopril (PRINIVIL,ZESTRIL) 10 MG tablet Take 10 mg by mouth daily.   Yes Historical Provider, MD  metoprolol (LOPRESSOR) 50 MG tablet Take 50 mg by mouth 2 (two) times  daily.   Yes Historical Provider, MD  multivitamin (RENA-VIT) TABS tablet Take 1 tablet by mouth daily.   Yes Historical Provider, MD  sevelamer (RENAGEL) 800 MG tablet Take 1,600 mg by mouth 3 (three) times daily with meals.   Yes Historical Provider, MD  Nutritional Supplements (FEEDING SUPPLEMENT, NEPRO CARB STEADY,) LIQD Take 237 mLs by mouth 3 (three) times daily with meals. 10/31/14   Nicholes Mango, MD  saccharomyces boulardii (FLORASTOR) 250 MG capsule Take 1 capsule (250 mg total) by mouth 2 (two) times daily. 10/31/14   Nicholes Mango, MD   No Known Allergies   FAMILY HISTORY   Family History  Problem Relation Age of Onset  . Diabetes Mother   . Diabetes Father       SOCIAL HISTORY  Former smoker - 20 years, .05-1 ppd  Worked in [employer]  ROS CONSTITUTIONAL: No fever, has weakness.  EYES: No blurred or double vision.  EARS, NOSE, AND THROAT: No tinnitus or ear pain.  RESPIRATORY: has cough, shortness of breath, no wheezing or hemoptysis.  CARDIOVASCULAR: No chest pain, orthopnea, edema.  GASTROINTESTINAL: No nausea, vomiting, diarrhea or abdominal pain.  GENITOURINARY: No dysuria, hematuria.  ENDOCRINE: No polyuria, nocturia,  HEMATOLOGY: No anemia, easy bruising or bleeding SKIN: No rash or lesion. MUSCULOSKELETAL: No joint pain or arthritis.  NEUROLOGIC: No tingling, numbness, weakness.  PSYCHIATRY: No anxiety or depression.  VITAL SIGNS    Temp:  [97.7 F (36.5 C)-98.9 F (37.2 C)] 98.1 F (36.7 C) (08/16 0804) Pulse Rate:  [71-106] 74 (08/16 0938) Resp:  [16-21] 18 (08/16 0938) BP: (120-168)/(57-103) 147/65 mmHg (08/16 0938) SpO2:  [91 %-100 %] 93 % (08/16 0938) FiO2 (%):  [2 %] 2 % (08/16 0914) Weight:  [126 lb 15.8 oz (57.6 kg)-128 lb 15.5 oz (58.5 kg)] 126 lb 15.8 oz (57.6 kg) (08/15 1515) HEMODYNAMICS:   VENTILATOR SETTINGS: Vent Mode:  [-]  FiO2 (%):  [2 %] 2 % INTAKE / OUTPUT:  Intake/Output Summary (Last 24 hours) at 11/13/14  1143 Last data filed at 11/13/14 0919  Gross per 24 hour  Intake    220 ml  Output   1000 ml  Net   -780 ml       PHYSICAL EXAM   Physical Exam GENERAL: BAD EYES: Both eyes a blind. HEENT: Head atraumatic, normocephalic. Oropharynx and nasopharynx clear.  NECK: Supple, no jugular venous distention. No thyroid enlargement, no tenderness.  LUNGS: Normal breath sounds bilaterally, no wheezing, no crackles, mild shallow breath sound. No use of accessory muscles of respiration.  CARDIOVASCULAR: S1, S2 normal. No murmurs, rubs, or gallops.  ABDOMEN: Soft, nontender, nondistended. Bowel sounds present. No organomegaly or mass.  EXTREMITIES: No pedal edema, cyanosis, or clubbing. Left AKA. NEUROLOGIC: Cranial nerves II through XII are intact. Muscle strength 5/5 in bilateral upper and right lower extremities. Sensation intact. Gait not checked.  PSYCHIATRIC: The patient is alert and oriented x 3.  SKIN: No obvious rash, lesion, or ulcer.     LABS   LABS:  CBC  Recent Labs Lab 11/10/14 1544 11/12/14 0549 11/13/14 0417  WBC 5.3 8.4 5.4  HGB 10.0* 9.1* 9.8*  HCT 31.4* 29.8* 32.8*  PLT 329 300 296   Coag's No results for input(s): APTT, INR in the last 168 hours. BMET  Recent Labs Lab 11/12/14 0549 11/12/14 1253 11/13/14 0417  NA 142 139 141  K 3.9 4.0 4.3  CL 98* 96* 96*  CO2 29 29 28   BUN 43* 46* 29*  CREATININE 7.63* 8.13* 5.68*  GLUCOSE 67 102* 233*   Electrolytes  Recent Labs Lab 11/12/14 0549 11/12/14 1253 11/12/14 1254 11/13/14 0417  CALCIUM 7.9* 7.8*  --  8.1*  PHOS  --  4.8* 2.7  --    Sepsis Markers No results for input(s): LATICACIDVEN, PROCALCITON, O2SATVEN in the last 168 hours. ABG No results for input(s): PHART, PCO2ART, PO2ART in the last 168 hours. Liver Enzymes  Recent Labs Lab 11/12/14 1253  ALBUMIN 2.8*   Cardiac Enzymes  Recent Labs Lab 11/10/14 1544 11/10/14 2014 11/11/14 0211  TROPONINI 0.28* 0.27* 0.24*    Glucose  Recent Labs Lab 11/12/14 0728 11/12/14 0844 11/12/14 1107 11/12/14 1606 11/12/14 2027 11/13/14 0743  GLUCAP 68 86 87 87 113* 179*     Recent Results (from the past 240 hour(s))  Culture, blood (routine x 2) Call MD if unable to obtain prior to antibiotics being given     Status: None (Preliminary result)   Collection Time: 11/10/14  8:14 PM  Result Value Ref Range Status   Specimen Description BLOOD LEFT FATTY CASTS  Final   Special Requests BOTTLES DRAWN AEROBIC AND ANAEROBIC 3CC  Final   Culture NO GROWTH 3 DAYS  Final   Report Status PENDING  Incomplete  Culture, blood (routine x 2) Call MD if unable to obtain prior to antibiotics being given  Status: None (Preliminary result)   Collection Time: 11/10/14  8:30 PM  Result Value Ref Range Status   Specimen Description BLOOD LEFT WRIST  Final   Special Requests BOTTLES DRAWN AEROBIC AND ANAEROBIC 4CC  Final   Culture NO GROWTH 3 DAYS  Final   Report Status PENDING  Incomplete     Current facility-administered medications:  .  0.9 %  sodium chloride infusion, 250 mL, Intravenous, PRN, Demetrios Loll, MD .  albuterol (PROVENTIL) (2.5 MG/3ML) 0.083% nebulizer solution 3 mL, 3 mL, Inhalation, Q6H PRN, Demetrios Loll, MD, 3 mL at 11/13/14 0213 .  aspirin EC tablet 81 mg, 81 mg, Oral, Daily, Demetrios Loll, MD, 81 mg at 11/12/14 1007 .  diphenhydrAMINE (BENADRYL) capsule 25 mg, 25 mg, Oral, QHS PRN, Lytle Butte, MD, 25 mg at 11/11/14 2158 .  [START ON 11/14/2014] epoetin alfa (EPOGEN,PROCRIT) injection 10,000 Units, 10,000 Units, Intravenous, Q M,W,F-HD, Munsoor Lateef, MD .  gabapentin (NEURONTIN) capsule 300 mg, 300 mg, Oral, TID, Demetrios Loll, MD, 300 mg at 11/13/14 0818 .  guaiFENesin-codeine 100-10 MG/5ML solution 10 mL, 10 mL, Oral, Q4H PRN, Lytle Butte, MD, 10 mL at 11/12/14 0109 .  heparin injection 5,000 Units, 5,000 Units, Subcutaneous, 3 times per day, Demetrios Loll, MD, 5,000 Units at 11/12/14 2158 .  insulin aspart  (novoLOG) injection 0-5 Units, 0-5 Units, Subcutaneous, QHS, Demetrios Loll, MD, 2 Units at 11/11/14 2158 .  insulin aspart (novoLOG) injection 0-9 Units, 0-9 Units, Subcutaneous, TID WC, Demetrios Loll, MD, 2 Units at 11/13/14 [phone] .  insulin glargine (LANTUS) injection 10 Units, 10 Units, Subcutaneous, BH-q7a, Demetrios Loll, MD, 10 Units at 11/13/14 0820 .  lisinopril (PRINIVIL,ZESTRIL) tablet 10 mg, 10 mg, Oral, Daily, Demetrios Loll, MD, 10 mg at 11/13/14 0818 .  metoprolol (LOPRESSOR) tablet 50 mg, 50 mg, Oral, BID, Demetrios Loll, MD, 50 mg at 11/13/14 0818 .  multivitamin (RENA-VIT) tablet 1 tablet, 1 tablet, Oral, Daily, Demetrios Loll, MD, 1 tablet at 11/13/14 0817 .  ondansetron (ZOFRAN) injection 4 mg, 4 mg, Intravenous, Q6H PRN, Nicholes Mango, MD, 4 mg at 11/11/14 1355 .  piperacillin-tazobactam (ZOSYN) IVPB 3.375 g, 3.375 g, Intravenous, Q12H, Demetrios Loll, MD, 3.375 g at 11/13/14 0824 .  sevelamer carbonate (RENVELA) tablet 800 mg, 800 mg, Oral, TID WC, Demetrios Loll, MD, 800 mg at 11/12/14 1628 .  sodium chloride 0.9 % injection 3 mL, 3 mL, Intravenous, Q12H, Demetrios Loll, MD, 3 mL at 11/13/14 0824 .  sodium chloride 0.9 % injection 3 mL, 3 mL, Intravenous, PRN, Demetrios Loll, MD .  vancomycin (VANCOCIN) 500 mg in sodium chloride 0.9 % 100 mL IVPB, 500 mg, Intravenous, Q M,W,F-HD, Nicholes Mango, MD, 500 mg at 11/12/14 1401  IMAGING    Dg Chest 2 View  11/13/2014   CLINICAL DATA:  Status post left thoracentesis  EXAM: CHEST - 2 VIEW  COMPARISON:  11/10/2014  FINDINGS: There is been resolution of left-sided pleural effusion. No pneumothorax is noted. Some patchy changes are noted in the right lung base which may represent some early infiltrate. No bony abnormality is noted.  IMPRESSION: No pneumothorax following left thoracentesis.   Electronically Signed   By: Inez Catalina M.D.   On: 11/13/2014 11:07   US Thoracentesis Asp Pleural Space W/img Guide  11/13/2014   CLINICAL DATA:  Bilateral pleural effusions left greater than right   EXAM: ULTRASOUND GUIDED left THORACENTESIS  PROCEDURE: An ultrasound guided thoracentesis was thoroughly discussed with the patient and questions answered. The  benefits, risks, alternatives and complications were also discussed. The patient understands and wishes to proceed with the procedure. Written consent was obtained.  Ultrasound was performed to localize and mark an adequate pocket of fluid in the left chest. The area was then prepped and draped in the normal sterile fashion. 1% Lidocaine was used for local anesthesia. Under ultrasound guidance a 6 French thoracentesis catheter was introduced. Thoracentesis was performed. The catheter was removed and a dressing applied.  COMPLICATIONS: None  FINDINGS: A total of approximately 1.4 L of clear yellow fluid was removed. A fluid sample was sent for laboratory analysis.  IMPRESSION: Successful ultrasound guided left thoracentesis yielding 1.4 L of pleural fluid.   Electronically Signed   By: Inez Catalina M.D.   On: 11/13/2014 11:06     ASSESSMENT/PLAN  80 yo male with PMHx of ESRD, GERD, L AKA, DM, seen in consultation for recurrent HCAP Likely cause of effusion is transudative process from ESRD less Likely from PNA  PULMONARY Bilateral upper lobe opacities - ?HCAP Pleural Effusion Cough\sob-resolving P:   -on Zosyn Has a hx of K.pneumoniae in sputum and CHRYSEOBACTERIUM INDOLOGENES in 10/2013 Bilateral Pl Effusion - L>R, S/p thoracentesis  Incentive spirometry   CARDIOVASCULAR CVL - none Cont with montioring   RENAL ESRD - on dialysis  GASTROINTESTINAL Cont with current diet and GERD tx  HEMATOLOGIC A:  AOCD P:  montior H\H  INFECTIOUS A:  HCAP P:   On abx  I have personally obtained a history, examined the patient, evaluated Pertinent laboratory and RadioGraphic/imaging results, and  formulated the assessment and plan   The Patient requires high complexity decision making for assessment and support, frequent evaluation  and titration of therapies. Time Spent with patient 20 mins   Teasia Zapf Patricia Pesa, M.D.  [facility]  Medical Director [place] Director [place] Department        11/13/2014, 11:43 AM

## 2014-11-14 LAB — GLUCOSE, CAPILLARY
GLUCOSE-CAPILLARY: 232 mg/dL — AB (ref 65–99)
Glucose-Capillary: 120 mg/dL — ABNORMAL HIGH (ref 65–99)
Glucose-Capillary: 108 mg/dL — ABNORMAL HIGH (ref 65–99)

## 2014-11-14 LAB — MISC LABCORP TEST (SEND OUT): Labcorp test code: 101170

## 2014-11-14 LAB — PATHOLOGIST SMEAR REVIEW

## 2014-11-14 MED ORDER — CETYLPYRIDINIUM CHLORIDE 0.05 % MT LIQD: 7.0000 mL | Freq: Two times a day (BID) | OROMUCOSAL | Status: DC

## 2014-11-14 MED ORDER — AMOXICILLIN-POT CLAVULANATE 875-125 MG PO TABS: 1.0000 | ORAL_TABLET | Freq: Two times a day (BID) | ORAL | Status: DC

## 2014-11-14 NOTE — Progress Notes (Addendum)
Spoke with [facility] nurse regarding patients discharge she states [facility] may not have care available. I called pts daughter [name] she voiced concern regarding her father coming home she states she feels like his status has not changed and she feels something is else is wrong with him. I told her I would voice her concerns to Dr Bobbye Riggs. Damaris Schooner with Dr Bobbye Riggs, the plan to discharge after dialysis is unchanged. Awaiting patient to return to unit for discharge. Patients [name] assured me if her father was discharged tonight there would be healthcare providers available at his home.

## 2014-11-14 NOTE — Discharge Instructions (Signed)

## 2014-11-14 NOTE — Care Management Important Message (Signed)
Important Message  Patient Details  Name: [name] MRN: [id number] Date of Birth: 06-02-1934   Medicare Important Message Given:  Yes-fourth notification given    [name] 11/14/2014, 10:43 AM

## 2014-11-14 NOTE — Progress Notes (Addendum)
   11/14/14 1100  Clinical Encounter Type  Visited With Patient  Visit Type Spiritual support  Spiritual Encounters  Spiritual Needs Grief support  Stress Factors  Patient Stress Factors Health changes   Status: Pneumonia, 80 yr male Family: None present Faith: Holiness Visit Assessment: The chaplain gave encouraging words as the patient shared that his wife, [name] is a good one and calls him. 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 He asked for a cup of coffee and the chaplain notified the nurse. He attended [employer], he shared. He said that he prays to cope with his conditions but he has not been able to attend church for some time.  Pastoral Care: [phone] pager or by online request

## 2014-11-14 NOTE — Progress Notes (Signed)
PRE HD   11/14/14 1300  Neurological  Level of Consciousness Alert  Orientation Level Oriented X4  Respiratory  Respiratory Pattern Regular;Unlabored  Chest Assessment Chest expansion symmetrical  Bilateral Breath Sounds Expiratory wheezes  Cough Non-productive  Cardiac  Pulse Regular  ECG Monitor Yes  Cardiac Rhythm NSR  Vascular  Edema (NONE )  Integumentary  Integumentary (WDL) WDL  Skin Color Appropriate for ethnicity  Skin Condition Dry  Skin Integrity Amputation  Musculoskeletal  Musculoskeletal (WDL) X  Generalized Weakness Yes  Assistive Device None  Gastrointestinal  Bowel Sounds Assessment Active  GU Assessment  Genitourinary (WDL) X  Genitourinary Symptoms Other (Comment) (HD)  Psychosocial  Psychosocial (WDL) WDL

## 2014-11-14 NOTE — Progress Notes (Signed)
[facility]  ROUNDING NOTE   Subjective:  Pt had HD yesterday. Ate breakfast this AM. Resting comfortably.  Had thoracentesis as well.  Objective:  Vital signs in last 24 hours:  Temp:  [98.4 F (36.9 C)-98.8 F (37.1 C)] 98.5 F (36.9 C) (08/17 0746) Pulse Rate:  [73-79] 73 (08/17 0746) Resp:  [18] 18 (08/17 0746) BP: (126-161)/(54-100) 161/54 mmHg (08/17 0746) SpO2:  [93 %-97 %] 95 % (08/17 0746)  Weight change:  Filed Weights   11/11/14 0520 11/12/14 1155 11/12/14 1515  Weight: 57.516 kg (126 lb 12.8 oz) 58.5 kg (128 lb 15.5 oz) 57.6 kg (126 lb 15.8 oz)    Intake/Output: I/O last 3 completed shifts: In: 950 [P.O.:600; IV Piggyback:350] Out: 0    Intake/Output this shift:     Physical Exam: General: NAD  Head: Normocephalic, atraumatic. Moist oral mucosal membranes  Eyes: +opaque left eye  Neck: Supple, trachea midline  Lungs:  Clear to auscultation normal effort  Heart: Regular rate and rhythm  Abdomen:  Soft, nontender, BS present  Extremities: no peripheral edema, left AKA  Neurologic: Awake, alert, follows commands  Skin: No lesions  Access: Right arm AVF    Basic Metabolic Panel:  Recent Labs Lab 11/10/14 1544 11/12/14 0549 11/12/14 1253 11/12/14 1254 11/13/14 0417  NA 142 142 139  --  141  K 3.4* 3.9 4.0  --  4.3  CL 98* 98* 96*  --  96*  CO2 30 29 29   --  28  GLUCOSE 108* 67 102*  --  233*  BUN 23* 43* 46*  --  29*  CREATININE 5.17* 7.63* 8.13*  --  5.68*  CALCIUM 8.0* 7.9* 7.8*  --  8.1*  PHOS  --   --  4.8* 2.7  --     Liver Function Tests:  Recent Labs Lab 11/12/14 1253  ALBUMIN 2.8*   No results for input(s): LIPASE, AMYLASE in the last 168 hours. No results for input(s): AMMONIA in the last 168 hours.  CBC:  Recent Labs Lab 11/10/14 1544 11/12/14 0549 11/13/14 0417  WBC 5.3 8.4 5.4  NEUTROABS 3.5  --   --   HGB 10.0* 9.1* 9.8*  HCT 31.4* 29.8* 32.8*  MCV 71.7* 71.3* 72.3*  PLT 329 300 296     Cardiac Enzymes:  Recent Labs Lab 11/10/14 1544 11/10/14 2014 11/11/14 0211  TROPONINI 0.28* 0.27* 0.24*    BNP: Invalid input(s): POCBNP  CBG:  Recent Labs Lab 11/13/14 1154 11/13/14 1637 11/13/14 1706 11/13/14 1757 11/14/14 0745  GLUCAP 136* 61* 80* 110* 120*    Microbiology: Results for orders placed or performed during the hospital encounter of 11/10/14  Culture, blood (routine x 2) Call MD if unable to obtain prior to antibiotics being given     Status: None (Preliminary result)   Collection Time: 11/10/14  8:14 PM  Result Value Ref Range Status   Specimen Description BLOOD LEFT FATTY CASTS  Final   Special Requests BOTTLES DRAWN AEROBIC AND ANAEROBIC 3CC  Final   Culture NO GROWTH 4 DAYS  Final   Report Status PENDING  Incomplete  Culture, blood (routine x 2) Call MD if unable to obtain prior to antibiotics being given     Status: None (Preliminary result)   Collection Time: 11/10/14  8:30 PM  Result Value Ref Range Status   Specimen Description BLOOD LEFT WRIST  Final   Special Requests BOTTLES DRAWN AEROBIC AND ANAEROBIC 4CC  Final   Culture NO  GROWTH 4 DAYS  Final   Report Status PENDING  Incomplete  Body fluid culture     Status: None (Preliminary result)   Collection Time: 11/13/14  9:25 AM  Result Value Ref Range Status   Specimen Description PLEURAL  Final   Special Requests NONE  Final   Gram Stain PENDING  Incomplete   Culture NO GROWTH < 24 HOURS  Final   Report Status PENDING  Incomplete    Coagulation Studies: No results for input(s): LABPROT, INR in the last 72 hours.  Urinalysis: No results for input(s): COLORURINE, LABSPEC, PHURINE, GLUCOSEU, HGBUR, BILIRUBINUR, KETONESUR, PROTEINUR, UROBILINOGEN, NITRITE, LEUKOCYTESUR in the last 72 hours.  Invalid input(s): APPERANCEUR    Imaging: Dg Chest 2 View  11/13/2014   CLINICAL DATA:  Status post left thoracentesis  EXAM: CHEST - 2 VIEW  COMPARISON:  11/10/2014  FINDINGS: There is been  resolution of left-sided pleural effusion. No pneumothorax is noted. Some patchy changes are noted in the right lung base which may represent some early infiltrate. No bony abnormality is noted.  IMPRESSION: No pneumothorax following left thoracentesis.   Electronically Signed   By: Inez Catalina M.D.   On: 11/13/2014 11:07   US Thoracentesis Asp Pleural Space W/img Guide  11/13/2014   CLINICAL DATA:  Bilateral pleural effusions left greater than right  EXAM: ULTRASOUND GUIDED left THORACENTESIS  PROCEDURE: An ultrasound guided thoracentesis was thoroughly discussed with the patient and questions answered. The benefits, risks, alternatives and complications were also discussed. The patient understands and wishes to proceed with the procedure. Written consent was obtained.  Ultrasound was performed to localize and mark an adequate pocket of fluid in the left chest. The area was then prepped and draped in the normal sterile fashion. 1% Lidocaine was used for local anesthesia. Under ultrasound guidance a 6 French thoracentesis catheter was introduced. Thoracentesis was performed. The catheter was removed and a dressing applied.  COMPLICATIONS: None  FINDINGS: A total of approximately 1.4 L of clear yellow fluid was removed. A fluid sample was sent for laboratory analysis.  IMPRESSION: Successful ultrasound guided left thoracentesis yielding 1.4 L of pleural fluid.   Electronically Signed   By: Inez Catalina M.D.   On: 11/13/2014 11:06     Medications:     . antiseptic oral rinse  7 mL Mouth Rinse BID  . aspirin EC  81 mg Oral Daily  . epoetin (EPOGEN/PROCRIT) injection  10,000 Units Intravenous Q M,W,F-HD  . gabapentin  300 mg Oral TID  . heparin  5,000 Units Subcutaneous 3 times per day  . insulin aspart  0-5 Units Subcutaneous QHS  . insulin aspart  0-9 Units Subcutaneous TID WC  . lisinopril  10 mg Oral Daily  . metoprolol  50 mg Oral BID  . multivitamin  1 tablet Oral Daily  .  piperacillin-tazobactam (ZOSYN)  IV  3.375 g Intravenous Q12H  . sevelamer carbonate  800 mg Oral TID WC  . sodium chloride  3 mL Intravenous Q12H  . vancomycin  500 mg Intravenous Q M,W,F-HD   sodium chloride, albuterol, diphenhydrAMINE, guaiFENesin-codeine, ondansetron (ZOFRAN) IV, sodium chloride  Assessment/ Plan:  Mr. Kendon Sedeno is a 80 y.o. black male with ESRD, on HD , Diabetes melllitus. PVD left AKA,   [facility]//[address]//MWF  1. ESRD on HD MWF: Pt due for HD today, orders prepared, continue HD on MWF schedule.  2. Hypertension: BP fluctuates, currently 161/54, due for ultrafiltration today with HD which should help  BP, continue lisinopril/metoprolol.   3. AOCKD:hgb up to 9.8, continue epogen 10000 units IV with HD.   4. Secondary Hyperparathyroidism:  Last phos was 2.7, continue renvela 800mg  po tid/wm.  5.  Bilateral pleural effusions: s/p L thoracentesis 11/13/14.   LOS: 4 Casha Estupinan 8/17/20169:39 AM

## 2014-11-14 NOTE — Care Management (Signed)
Patient discharging home today. I have notified Ashely with [phone] and she is going to call [facility] RN regarding cultures obtained yesterday and arrange transportation. No further RNCM needs.

## 2014-11-14 NOTE — Progress Notes (Signed)
Gave discharge instructions to patient he verbalized he understands but as documented he is forgetful. I called his daughter [name] and went over instructions as well as reminded her the instructions and the prescription for Augmentin will be in the [place] folder which will be sent home with patient. EMS called.

## 2014-11-14 NOTE — Progress Notes (Signed)
POST HD   11/14/14 1800  Vital Signs  Temp 98.1 F (36.7 C)  Temp Source Oral  Pulse Rate 72  Pulse Rate Source Monitor  Resp 19  BP (!) 168/80 mmHg  BP Location Left Arm  BP Method Automatic  Patient Position (if appropriate) Lying  Dialysis Weight  Weight 52.5 kg (115 lb 11.9 oz)  Type of Weight Post-Dialysis  During Hemodialysis Assessment  Intra-Hemodialysis Comments hd treatment end. Goal met. blood returned and 15g needles removed per policy.  Pt alert and VS stable.   Post-Hemodialysis Assessment  Rinseback Volume (mL) 250 mL  Dialyzer Clearance Lightly streaked  Duration of HD Treatment -hour(s) 3 hour(s)  Hemodialysis Intake (mL) 500 mL  UF Total -Machine (mL) 3000 mL  Net UF (mL) 2500 mL  Tolerated HD Treatment Yes  Post-Hemodialysis Comments hd treatment end. Goal met. blood returned and 15g needles removed per policy.  Pt alert and VS stable.   AVG/AVF Arterial Site Held (minutes) 7 minutes  AVG/AVF Venous Site Held (minutes) 7 minutes  Education / Care Plan  Hemodialysis Education Provided Yes  Documented Education in Clinical Pathway Yes  Fistula / Graft Right Upper arm Arteriovenous fistula  No Placement Date or Time found.   Placed prior to admission: Yes  Orientation: Right  Access Location: Upper arm  Access Type: Arteriovenous fistula  Site Condition No complications  Fistula / Graft Assessment Present;Thrill;Bruit  Status Deaccessed  Needle Size 15  Drainage Description None

## 2014-11-14 NOTE — Progress Notes (Signed)
POST HD   11/14/14 1800  Neurological  Level of Consciousness Alert  Orientation Level Oriented X4  Respiratory  Respiratory Pattern Regular;Unlabored  Chest Assessment Chest expansion symmetrical  Bilateral Breath Sounds Diminished  Cardiac  Pulse Regular  ECG Monitor Yes  Cardiac Rhythm NSR  Vascular  Edema (NONE )  Integumentary  Integumentary (WDL) WDL  Skin Integrity Amputation  Musculoskeletal  Musculoskeletal (WDL) X  Generalized Weakness Yes  Assistive Device None  Gastrointestinal  Bowel Sounds Assessment Active  GU Assessment  Genitourinary (WDL) X  Genitourinary Symptoms Other (Comment) (HD)  Psychosocial  Psychosocial (WDL) WDL

## 2014-11-14 NOTE — Progress Notes (Signed)
PRE HD   11/14/14 1430  Vital Signs  Temp 97.9 F (36.6 C)  Temp Source Oral  Pulse Rate 72  Pulse Rate Source Monitor  Resp 20  BP (!) 130/58 mmHg  BP Location Left Arm  BP Method Automatic  Patient Position (if appropriate) Lying  Oxygen Therapy  SpO2 95 %  O2 Device Nasal Cannula  O2 Flow Rate (L/min) 2 L/min  Pain Assessment  Pain Assessment No/denies pain  Pain Score 0  Dialysis Weight  Weight 56 kg (123 lb 7.3 oz)  Type of Weight Pre-Dialysis  Time-Out for Hemodialysis  What Procedure? HEMODIALYSIS  Pt Identifiers(min of two) First/Last Name;MRN/Account#  Correct Site? Yes  Correct Side? Yes  Correct Procedure? Yes  Consents Verified? Yes  Rad Studies Available? N/A  Safety Precautions Reviewed? Yes  Engineer, civil (consulting) Number [phone]  Station Number 1  UF/Alarm Test Passed  Conductivity: Meter 13.9  Conductivity: Machine  14.4  pH 7.4  Reverse Osmosis MAIN  Dialyzer Lot Number [id number]  Disposable Set Lot Number [id number]  Machine Temperature 98.6 F (37 C)  Musician and Audible Yes  Blood Lines Intact and Secured Yes  Pre Treatment Patient Checks  Vascular access used during treatment Fistula  Hepatitis B Surface Antigen Results Negative  Date Hepatitis B Surface Antigen Drawn 10/29/14  Hepatitis B Surface Antibody 25  Date Hepatitis B Surface Antibody Drawn 01/08/14  Hemodialysis Consent Verified Yes  Hemodialysis Standing Orders Initiated Yes  ECG (Telemetry) Monitor On Yes  Prime Ordered Normal Saline  Length of  DialysisTreatment -hour(s) 3 Hour(s)  Dialysis Treatment Comments GOAL TO REMOVE 1.5L IN 3 HOURS. PT ALERT AND VS STABLE. 15G NEEDLES INSERTED WITHOUT DIFFICULTY.    Dialyzer Optiflux 180 NR  Dialysate (3K2.5CA)  Dialysis Anticoagulant None  Dialysate Flow Ordered 600  Blood Flow Rate Ordered 400 mL/min  Ultrafiltration Goal 1.5 Liters  Pre Treatment Labs Renal panel;CBC;Phosphorus  Dialysis Blood Pressure Support Ordered  Normal Saline  Fistula / Graft Right Upper arm Arteriovenous fistula  No Placement Date or Time found.   Placed prior to admission: Yes  Orientation: Right  Access Location: Upper arm  Access Type: Arteriovenous fistula  Site Condition No complications  Fistula / Graft Assessment Present;Thrill;Bruit  Status Accessed  Needle Size 15  Drainage Description None

## 2014-11-15 LAB — CULTURE, BLOOD (ROUTINE X 2)
CULTURE: NO GROWTH
CULTURE: NO GROWTH

## 2014-11-15 LAB — PH, BODY FLUID: PH, BODY FLUID: 7.6

## 2014-11-15 LAB — CYTOLOGY - NON PAP

## 2014-11-15 NOTE — Discharge Summary (Signed)
[place] [facility] [place] NAME: [name]    MR#:  [id number]  DATE OF BIRTH:  08/21/1934  DATE OF ADMISSION:  11/10/2014 ADMITTING PHYSICIAN: Demetrios Loll, MD  DATE OF DISCHARGE: 11/14/2014 10:08 PM  PRIMARY CARE PHYSICIAN: Rinaldo Cloud, MD    ADMISSION DIAGNOSIS:  Healthcare-associated pneumonia [J18.9] Hypervolemia, unspecified hypervolemia type [E87.70]  DISCHARGE DIAGNOSIS:  Principal Problem:   Pneumonia Active Problems:   Hypoxia   Fluid overload   Elevated troponin  SECONDARY DIAGNOSIS:   Past Medical History  Diagnosis Date  . Diabetes mellitus without complication   . Glaucoma   . Chronic kidney disease   . Hypertension   . Peripheral vascular disease   . Anemia of chronic disease   . MGUS (monoclonal gammopathy of unknown significance)   . Renal cyst    HOSPITAL COURSE:  80 y.o. male with a known history of hypertension, diabetes, ESRD on hemodialysis and anemia of chronic disease was admitted for pneumonia. Please see Dr Lianne Moris dictated H & P for further details. He was also found to have elevated troponins for which cardiology consultation was obtained who felt this to be supply demand ischemia. No invasive evaluation was recommended.  In regards to his Pneumonia he was treated with broad spectrum Abx (Vanco + Zosyn) and he responded well. Pulmo c/s was obtained who also were in agreement with this treatment. He was also noted to have b/l pleural effusion for which he underwent left-sided thoracentesis with removal of 1.6 L of fluid without any immediate complications.  He was feeling much better after thoracentesis and treatment of his pneumonia with Abx and was felt stable enough to be D/C home after dialysis on 17th of Aug (although, his daughter didn't feel the same way).  PT evaluated the patient and recommended D/C Supervision/Assistance - 24 hour.  He was d/c in stable condition although knowing daughter  not very happy, she may bring him back for readmission. DISCHARGE CONDITIONS:  stable CONSULTS OBTAINED:  Treatment Team:  Teodoro Spray, MD Vilinda Boehringer, MD Lavonia Dana, MD DRUG ALLERGIES:  No Known Allergies DISCHARGE MEDICATIONS:   Discharge Medication List as of 11/14/2014  5:58 PM    START taking these medications   Details  amoxicillin-clavulanate (AUGMENTIN) 875-125 MG per tablet Take 1 tablet by mouth 2 (two) times daily., Starting 11/14/2014, Until Discontinued, Normal      CONTINUE these medications which have NOT CHANGED   Details  albuterol (PROVENTIL HFA;VENTOLIN HFA) 108 (90 BASE) MCG/ACT inhaler Inhale 2 puffs into the lungs every 6 (six) hours as needed for wheezing or shortness of breath., Starting 10/31/2014, Until Discontinued, Normal    aspirin EC 81 MG tablet Take 81 mg by mouth daily., Until Discontinued, Historical Med    Cholecalciferol (VITAMIN D3) 50000 UNITS CAPS Take 50,000 Units by mouth every 30 (thirty) days., Until Discontinued, Historical Med    gabapentin (NEURONTIN) 300 MG capsule Take 300 mg by mouth 3 (three) times daily., Until Discontinued, Historical Med    insulin glargine (LANTUS) 100 UNIT/ML injection Inject 10 Units into the skin every morning., Until Discontinued, Historical Med    Iron-Vitamin C (VITRON-C PO) Take 1 tablet by mouth daily., Until Discontinued, Historical Med    lisinopril (PRINIVIL,ZESTRIL) 10 MG tablet Take 10 mg by mouth daily., Until Discontinued, Historical Med    metoprolol (LOPRESSOR) 50 MG tablet Take 50 mg by mouth 2 (two) times daily., Until Discontinued, Historical Med  multivitamin (RENA-VIT) TABS tablet Take 1 tablet by mouth daily., Until Discontinued, Historical Med    sevelamer (RENAGEL) 800 MG tablet Take 1,600 mg by mouth 3 (three) times daily with meals., Until Discontinued, Historical Med    Nutritional Supplements (FEEDING SUPPLEMENT, NEPRO CARB STEADY,) LIQD Take 237 mLs by mouth 3 (three)  times daily with meals., Starting 10/31/2014, Until Discontinued, Normal    saccharomyces boulardii (FLORASTOR) 250 MG capsule Take 1 capsule (250 mg total) by mouth 2 (two) times daily., Starting 10/31/2014, Until Discontinued, Normal      STOP taking these medications     cefdinir (OMNICEF) 300 MG capsule        DISCHARGE INSTRUCTIONS:   DIET:  Renal diet DISCHARGE CONDITION:  Good ACTIVITY:  Activity as tolerated OXYGEN:  Home Oxygen: No.  Oxygen Delivery: room air DISCHARGE LOCATION:  home   If you experience worsening of your admission symptoms, develop shortness of breath, life threatening emergency, suicidal or homicidal thoughts you must seek medical attention immediately by calling 911 or calling your MD immediately  if symptoms less severe.  You Must read complete instructions/literature along with all the possible adverse reactions/side effects for all the Medicines you take and that have been prescribed to you. Take any new Medicines after you have completely understood and accpet all the possible adverse reactions/side effects.   Please note  You were cared for by a hospitalist during your hospital stay. If you have any questions about your discharge medications or the care you received while you were in the hospital after you are discharged, you can call the unit and asked to speak with the hospitalist on call if the hospitalist that took care of you is not available. Once you are discharged, your primary care physician will handle any further medical issues. Please note that NO REFILLS for any discharge medications will be authorized once you are discharged, as it is imperative that you return to your primary care physician (or establish a relationship with a primary care physician if you do not have one) for your aftercare needs so that they can reassess your need for medications and monitor your lab values.    On the day of Discharge: VITAL SIGNS:  Blood pressure  152/52, pulse 83, temperature 98.6 F (37 C), temperature source Oral, resp. rate 22, height 5\' 6"  (1.676 m), weight 52.5 kg (115 lb 11.9 oz), SpO2 98 %. I/O:   Intake/Output Summary (Last 24 hours) at 11/15/14 1110 Last data filed at 11/14/14 2000  Gross per 24 hour  Intake    170 ml  Output   2500 ml  Net  -2330 ml   PHYSICAL EXAMINATION:  GENERAL:  80 y.o.-year-old patient lying in the bed with no acute distress.  EYES: Pupils equal, round, reactive to light and accommodation. No scleral icterus. Extraocular muscles intact.  HEENT: Head atraumatic, normocephalic. Oropharynx and nasopharynx clear.  NECK:  Supple, no jugular venous distention. No thyroid enlargement, no tenderness.  LUNGS: Normal breath sounds bilaterally, no wheezing, rales,rhonchi or crepitation. No use of accessory muscles of respiration.  CARDIOVASCULAR: S1, S2 normal. No murmurs, rubs, or gallops.  ABDOMEN: Soft, non-tender, non-distended. Bowel sounds present. No organomegaly or mass.  EXTREMITIES: No pedal edema, cyanosis, or clubbing.  NEUROLOGIC: Cranial nerves II through XII are intact. Muscle strength 5/5 in all extremities. Sensation intact. Gait not checked.  PSYCHIATRIC: The patient is alert and oriented x 3.  SKIN: No obvious rash, lesion, or ulcer.  DATA REVIEW:  CBC  Recent Labs Lab 11/13/14 0417  WBC 5.4  HGB 9.8*  HCT 32.8*  PLT 296    Chemistries   Recent Labs Lab 11/13/14 0417  NA 141  K 4.3  CL 96*  CO2 28  GLUCOSE 233*  BUN 29*  CREATININE 5.68*  CALCIUM 8.1*    Cardiac Enzymes  Recent Labs Lab 11/11/14 0211  TROPONINI 0.24*    Management plans discussed with the patient, family and they are in agreement.  CODE STATUS:  Advance Directive Documentation        Most Recent Value   Type of Advance Directive  Living will   Pre-existing out of facility DNR order (yellow form or pink MOST form)     "MOST" Form in Place?        Very high risk for  readmissions  TOTAL TIME TAKING CARE OF THIS PATIENT: 55 minutes.    [facility], Taejah Ohalloran M.D on 11/15/2014 at 11:10 AM  Between 7am to 6pm - Pager - [phone]  After 6pm go to [email or web address] - password [email or web address] [facility]  [facility]  Office  [phone]  CC: Primary care physician; Rinaldo Cloud, MD Vilinda Boehringer, MD Teodoro Spray, MD

## 2014-11-17 LAB — BODY FLUID CULTURE
CULTURE: NO GROWTH
GRAM STAIN: NONE SEEN

## 2014-11-17 LAB — MISC LABCORP TEST (SEND OUT): LABCORP TEST CODE: 247282

## 2015-02-24 ENCOUNTER — Emergency Department
Admission: EM | Admit: 2015-02-24 | Discharge: 2015-02-24 | Disposition: A | Discharge status: Home / Self Care | Payer: Medicare (Managed Care) | Attending: Emergency Medicine | Admitting: Emergency Medicine

## 2015-02-24 ENCOUNTER — Encounter: Payer: Self-pay | Admitting: Emergency Medicine

## 2015-02-24 DIAGNOSIS — E119 Type 2 diabetes mellitus without complications: Secondary | ICD-10-CM | POA: Diagnosis not present

## 2015-02-24 DIAGNOSIS — Z792 Long term (current) use of antibiotics: Secondary | ICD-10-CM | POA: Insufficient documentation

## 2015-02-24 DIAGNOSIS — Z7982 Long term (current) use of aspirin: Secondary | ICD-10-CM | POA: Diagnosis not present

## 2015-02-24 DIAGNOSIS — J3489 Other specified disorders of nose and nasal sinuses: Secondary | ICD-10-CM | POA: Diagnosis present

## 2015-02-24 DIAGNOSIS — Z79899 Other long term (current) drug therapy: Secondary | ICD-10-CM | POA: Insufficient documentation

## 2015-02-24 DIAGNOSIS — Z794 Long term (current) use of insulin: Secondary | ICD-10-CM | POA: Insufficient documentation

## 2015-02-24 DIAGNOSIS — I129 Hypertensive chronic kidney disease with stage 1 through stage 4 chronic kidney disease, or unspecified chronic kidney disease: Secondary | ICD-10-CM | POA: Diagnosis not present

## 2015-02-24 DIAGNOSIS — Z87891 Personal history of nicotine dependence: Secondary | ICD-10-CM | POA: Diagnosis not present

## 2015-02-24 DIAGNOSIS — N189 Chronic kidney disease, unspecified: Secondary | ICD-10-CM | POA: Insufficient documentation

## 2015-02-24 MED ORDER — MUPIROCIN CALCIUM 2 % NA OINT: 1.0000 "application " | TOPICAL_OINTMENT | Freq: Two times a day (BID) | NASAL | Status: DC

## 2015-02-24 MED ORDER — MUPIROCIN 2 % EX OINT
TOPICAL_OINTMENT | Freq: Two times a day (BID) | CUTANEOUS | Status: DC
  Administered 2015-02-24: 1 via NASAL
  Filled 2015-02-24: qty 22

## 2015-02-24 MED ORDER — MUPIROCIN 2 % EX OINT: TOPICAL_OINTMENT | Freq: Two times a day (BID) | CUTANEOUS | Status: DC

## 2015-02-24 NOTE — ED Provider Notes (Signed)
[facility] Emergency Department Provider Note  ____________________________________________  Time seen: Approximately 8:56 AM  I have reviewed the triage vital signs and the nursing notes.   HISTORY  Chief Complaint Sore    HPI [name] is a 80 y.o. male who presents to the emergency department complaining of a "sore" in the nose. He states that symptoms have been ongoing for approximately a week. He states the sore is located in the most distal aspect of the right nares. He denies any nasal drainage or nosebleeds. He denies any headache, visual acuity changes, sore throat, difficulty breathing, chest pain, nausea or vomiting. He denies any fevers or chills. He states that the pain has been increasing over the past several days. Pain is sharp sensation with palpation of his nose.   Past Medical History  Diagnosis Date  . Diabetes mellitus without complication ([place])   . Glaucoma   . Chronic kidney disease   . Hypertension   . Peripheral vascular disease ([place])   . Anemia of chronic disease   . MGUS (monoclonal gammopathy of unknown significance)   . Renal cyst   . Blind     Patient Active Problem List   Diagnosis Date Noted  . Pneumonia 11/10/2014  . Hypoxia 11/10/2014  . Fluid overload 11/10/2014  . Elevated troponin 11/10/2014  . Healthcare-associated pneumonia 10/28/2014    Past Surgical History  Procedure Laterality Date  . Leg amputation above knee  left  . Prostatectomy      Current Outpatient Rx  Name  Route  Sig  Dispense  Refill  . albuterol (PROVENTIL HFA;VENTOLIN HFA) 108 (90 BASE) MCG/ACT inhaler   Inhalation   Inhale 2 puffs into the lungs every 6 (six) hours as needed for wheezing or shortness of breath.   1 Inhaler   2   . amoxicillin-clavulanate (AUGMENTIN) 875-125 MG per tablet   Oral   Take 1 tablet by mouth 2 (two) times daily.   10 tablet   0   . aspirin EC 81 MG tablet   Oral   Take 81 mg by mouth daily.          . Cholecalciferol (VITAMIN D3) 50000 UNITS CAPS   Oral   Take 50,000 Units by mouth every 30 (thirty) days.         Marland Kitchen gabapentin (NEURONTIN) 300 MG capsule   Oral   Take 300 mg by mouth 3 (three) times daily.         . insulin glargine (LANTUS) 100 UNIT/ML injection   Subcutaneous   Inject 10 Units into the skin every morning.         . Iron-Vitamin C (VITRON-C PO)   Oral   Take 1 tablet by mouth daily.         Marland Kitchen lisinopril (PRINIVIL,ZESTRIL) 10 MG tablet   Oral   Take 10 mg by mouth daily.         . metoprolol (LOPRESSOR) 50 MG tablet   Oral   Take 50 mg by mouth 2 (two) times daily.         . multivitamin (RENA-VIT) TABS tablet   Oral   Take 1 tablet by mouth daily.         . mupirocin nasal ointment (BACTROBAN) 2 %   Nasal   Place 1 application into the nose 2 (two) times daily. Use one-half of tube in each nostril twice daily for five (5) days. After application, press sides of nose together and  gently massage.   10 g   0   . Nutritional Supplements (FEEDING SUPPLEMENT, NEPRO CARB STEADY,) LIQD   Oral   Take 237 mLs by mouth 3 (three) times daily with meals.   90 Can   0   . saccharomyces boulardii (FLORASTOR) 250 MG capsule   Oral   Take 1 capsule (250 mg total) by mouth 2 (two) times daily.   30 capsule   0   . sevelamer (RENAGEL) 800 MG tablet   Oral   Take 1,600 mg by mouth 3 (three) times daily with meals.           Allergies Review of patient's allergies indicates no known allergies.  Family History  Problem Relation Age of Onset  . Diabetes Mother   . Diabetes Father     Social History Social History  Substance Use Topics  . Smoking status: Former Research scientist (life sciences)  . Smokeless tobacco: None  . Alcohol Use: No    Review of Systems Constitutional: No fever/chills Eyes: No visual changes. ENT: No sore throat. Endorses "sore" and right nares Cardiovascular: Denies chest pain. Respiratory: Denies shortness of  breath. Gastrointestinal: No abdominal pain.  No nausea, no vomiting.  No diarrhea.  No constipation. Genitourinary: Negative for dysuria. Musculoskeletal: Negative for back pain. Skin: Negative for rash. Neurological: Negative for headaches, focal weakness or numbness.  10-point ROS otherwise negative.  ____________________________________________   PHYSICAL EXAM:  VITAL SIGNS: ED Triage Vitals  Enc Vitals Group     BP 02/24/15 0851 189/82 mmHg     Pulse Rate 02/24/15 0851 88     Resp 02/24/15 0851 20     Temp 02/24/15 0851 98.7 F (37.1 C)     Temp Source 02/24/15 0851 Oral     SpO2 02/24/15 0851 94 %     Weight 02/24/15 0851 122 lb (55.339 kg)     Height 02/24/15 0851 5\' 6"  (1.676 m)     Head Cir --      Peak Flow --      Pain Score 02/24/15 0852 10     Pain Loc --      Pain Edu? --      Excl. in [place]? --     Constitutional: Alert and oriented. Well appearing and in no acute distress. Eyes: Conjunctivae are normal. PERRL. EOMI. Head: Atraumatic. Nose: No congestion/rhinnorhea. Firm erythematous edematous nodule in the distal aspect of the right nares. Area is firm to palpation. No drainage noted. Nodule surrounds hair follicle. No nasal obstruction. No sign of epistaxis. Left nares is unremarkable. Mouth/Throat: Mucous membranes are moist.  Oropharynx non-erythematous. Neck: No stridor.   Hematological/Lymphatic/Immunilogical: No cervical lymphadenopathy. Cardiovascular: Normal rate, regular rhythm. Grossly normal heart sounds.  Good peripheral circulation. Respiratory: Normal respiratory effort.  No retractions. Lungs CTAB. Gastrointestinal: Soft and nontender. No distention. No abdominal bruits. No CVA tenderness. Musculoskeletal: No lower extremity tenderness nor edema.  No joint effusions. Patient is an above-the-knee amputation left side. Neurologic:  Normal speech and language. No gross focal neurologic deficits are appreciated. No gait instability. Skin:  Skin is  warm, dry and intact. No rash noted. Psychiatric: Mood and affect are normal. Speech and behavior are normal.  ____________________________________________   LABS (all labs ordered are listed, but only abnormal results are displayed)  Labs Reviewed - No data to display ____________________________________________  EKG   ____________________________________________  RADIOLOGY   ____________________________________________   PROCEDURES  Procedure(s) performed: None  Critical Care performed: No  ____________________________________________  INITIAL IMPRESSION / ASSESSMENT AND PLAN / ED COURSE  Pertinent labs & imaging results that were available during my care of the patient were reviewed by me and considered in my medical decision making (see chart for details).  Patient's history, symptoms, physical exam take and consideration for diagnosis. Diagnosis consistent with nasal vestibulitis. I informed patient and his daughter of findings and diagnosis and they verbalized understanding of same. The patient is on the PACE program and receives his medications for same. Per the daughter they will be able to receive the medication tomorrow but not today. Due to this, I called pharmacy for mupirocin nasal ointment and will be giving patient first dose of mupirocin nasal ointment in the emergency department. Patient verbalizes understanding of the diagnosis and treatment plan and verbalizes compliance of same. Patient is to follow-up with ENT provider should symptoms persist past treatment course with nasal ointment. Instructions are given on how to apply nasal ointment and patient and daughter verbalized understanding same. ____________________________________________   FINAL CLINICAL IMPRESSION(S) / ED DIAGNOSES  Final diagnoses:  Nasal vestibulitis      Darletta Moll, PA-C 02/24/15 [place], MD 02/24/15 [phone]

## 2015-02-24 NOTE — ED Notes (Signed)
Denies injury to nose

## 2015-02-24 NOTE — Discharge Instructions (Signed)
Mupirocin nasal ointment  What is this medicine?  MUPIROCIN CALCIUM (myoo PEER oh sin KAL see um) is an antibiotic. It is used inside the nose to treat infections that are caused by certain bacteria. This helps prevent the spread of infection to patients and health care workers during outbreaks at institutions.  This medicine may be used for other purposes; ask your health care provider or pharmacist if you have questions.  What should I tell my health care provider before I take this medicine?  They need to know if you have any of these conditions:  -an unusual or allergic reaction to mupirocin, other medicines, foods, dyes, or preservatives  -pregnant or trying to get pregnant  -breast-feeding  How should I use this medicine?  This medicine is only for use inside the nose. Follow the directions on the prescription label. Wash your hands before and after use. Squeeze half the contents of a single-use tube into one nostril, then squeeze the other half into the other nostril. Press the sides of your nose together and gently massage after application to spread the ointment throughout the nostrils. Do not use your medicine more often than directed. Finish the full course of medicine prescribed by your doctor or health care professional even if you think your condition is better.  Talk to your pediatrician regarding the use of this medicine in children. Special care may be needed.  Overdosage: If you think you have taken too much of this medicine contact a poison control center or emergency room at once.  NOTE: This medicine is only for you. Do not share this medicine with others.  What if I miss a dose?  If you miss a dose, take it as soon as you can. If it is almost time for your next dose, take only that dose. Do not take double or extra doses.  What may interact with this medicine?  Interactions are not expected. Do not use any other nose products without telling your doctor or health care  professional.  This list may not describe all possible interactions. Give your health care provider a list of all the medicines, herbs, non-prescription drugs, or dietary supplements you use. Also tell them if you smoke, drink alcohol, or use illegal drugs. Some items may interact with your medicine.  What should I watch for while using this medicine?  If your nose is severely irritated, burning or stinging from use of this medicine, stop using it and contact your doctor or health care professional.  Do not get this medicine in your eyes. If you do, rinse out with plenty of cool tap water.  What side effects may I notice from receiving this medicine?  Side effects that you should report to your doctor or health care professional as soon as possible:  -severe irritation, burning, stinging, or pain  Side effects that usually do not require medical attention (report to your doctor or health care professional if they continue or are bothersome):  -altered taste  -cough  -headache  -skin itching  -sore throat  -stuffy or runny nose  This list may not describe all possible side effects. Call your doctor for medical advice about side effects. You may report side effects to FDA at 1-800-FDA-1088.  Where should I keep my medicine?  Keep out of the reach of children.  Store at room temperature between 15 and 30 degrees C (59 and 86 degrees F). Do not refrigerate. One tube of ointment is for single use in both  nostrils. Throw away after use.  NOTE: This sheet is a summary. It may not cover all possible information. If you have questions about this medicine, talk to your doctor, pharmacist, or health care provider.   2016, Elsevier/Gold Standard. (2007-10-03 14:36:10)

## 2015-04-17 ENCOUNTER — Encounter: Payer: Self-pay | Admitting: *Deleted

## 2015-04-18 ENCOUNTER — Encounter: Payer: Self-pay | Admitting: *Deleted

## 2015-04-18 ENCOUNTER — Ambulatory Visit: Payer: Medicare (Managed Care) | Admitting: Anesthesiology

## 2015-04-18 ENCOUNTER — Encounter
Admission: RE | Disposition: A | Discharge status: Home / Self Care | Payer: Self-pay | Source: Ambulatory Visit | Attending: Gastroenterology

## 2015-04-18 ENCOUNTER — Ambulatory Visit
Admission: RE | Admit: 2015-04-18 | Discharge: 2015-04-18 | Disposition: A | Discharge status: Home / Self Care | Payer: Medicare (Managed Care) | Source: Ambulatory Visit | Attending: Gastroenterology | Admitting: Gastroenterology

## 2015-04-18 DIAGNOSIS — K921 Melena: Secondary | ICD-10-CM | POA: Diagnosis not present

## 2015-04-18 DIAGNOSIS — N186 End stage renal disease: Secondary | ICD-10-CM | POA: Insufficient documentation

## 2015-04-18 DIAGNOSIS — H540 Blindness, both eyes: Secondary | ICD-10-CM | POA: Diagnosis not present

## 2015-04-18 DIAGNOSIS — Z87891 Personal history of nicotine dependence: Secondary | ICD-10-CM | POA: Diagnosis not present

## 2015-04-18 DIAGNOSIS — Z7982 Long term (current) use of aspirin: Secondary | ICD-10-CM | POA: Diagnosis not present

## 2015-04-18 DIAGNOSIS — Z89612 Acquired absence of left leg above knee: Secondary | ICD-10-CM | POA: Diagnosis not present

## 2015-04-18 DIAGNOSIS — Z992 Dependence on renal dialysis: Secondary | ICD-10-CM | POA: Insufficient documentation

## 2015-04-18 DIAGNOSIS — K31819 Angiodysplasia of stomach and duodenum without bleeding: Secondary | ICD-10-CM | POA: Diagnosis not present

## 2015-04-18 DIAGNOSIS — E119 Type 2 diabetes mellitus without complications: Secondary | ICD-10-CM | POA: Diagnosis not present

## 2015-04-18 DIAGNOSIS — D509 Iron deficiency anemia, unspecified: Secondary | ICD-10-CM | POA: Insufficient documentation

## 2015-04-18 DIAGNOSIS — Z794 Long term (current) use of insulin: Secondary | ICD-10-CM | POA: Diagnosis not present

## 2015-04-18 DIAGNOSIS — E1151 Type 2 diabetes mellitus with diabetic peripheral angiopathy without gangrene: Secondary | ICD-10-CM | POA: Diagnosis not present

## 2015-04-18 DIAGNOSIS — I120 Hypertensive chronic kidney disease with stage 5 chronic kidney disease or end stage renal disease: Secondary | ICD-10-CM | POA: Diagnosis not present

## 2015-04-18 DIAGNOSIS — I739 Peripheral vascular disease, unspecified: Secondary | ICD-10-CM | POA: Diagnosis not present

## 2015-04-18 DIAGNOSIS — Z79899 Other long term (current) drug therapy: Secondary | ICD-10-CM | POA: Insufficient documentation

## 2015-04-18 DIAGNOSIS — E1122 Type 2 diabetes mellitus with diabetic chronic kidney disease: Secondary | ICD-10-CM | POA: Insufficient documentation

## 2015-04-18 DIAGNOSIS — E559 Vitamin D deficiency, unspecified: Secondary | ICD-10-CM | POA: Diagnosis not present

## 2015-04-18 HISTORY — DX: Other specified cough: R05.8

## 2015-04-18 HISTORY — DX: Full incontinence of feces: R15.9

## 2015-04-18 HISTORY — DX: Atherosclerosis of native arteries of extremities with gangrene, unspecified extremity: I70.269

## 2015-04-18 HISTORY — DX: End stage renal disease: N18.6

## 2015-04-18 HISTORY — DX: Anodontia: K00.0

## 2015-04-18 HISTORY — DX: Unspecified protein-calorie malnutrition: E46

## 2015-04-18 HISTORY — DX: Melena: K92.1

## 2015-04-18 HISTORY — DX: Cough: R05

## 2015-04-18 HISTORY — PX: ESOPHAGOGASTRODUODENOSCOPY (EGD) WITH PROPOFOL: SHX5813

## 2015-04-18 HISTORY — DX: Unspecified iridocyclitis: H20.9

## 2015-04-18 HISTORY — DX: Pneumonia, unspecified organism: J18.9

## 2015-04-18 HISTORY — DX: Unqualified visual loss, both eyes: H54.3

## 2015-04-18 HISTORY — DX: History of falling: Z91.81

## 2015-04-18 HISTORY — DX: Complete loss of teeth, unspecified cause, unspecified class: K08.109

## 2015-04-18 LAB — GLUCOSE, CAPILLARY: Glucose-Capillary: 138 mg/dL — ABNORMAL HIGH (ref 65–99)

## 2015-04-18 SURGERY — ESOPHAGOGASTRODUODENOSCOPY (EGD) WITH PROPOFOL
Anesthesia: General

## 2015-04-18 MED ORDER — FENTANYL CITRATE (PF) 100 MCG/2ML IJ SOLN
INTRAMUSCULAR | Status: DC | PRN
  Administered 2015-04-18: 50 ug via INTRAVENOUS

## 2015-04-18 MED ORDER — SODIUM CHLORIDE 0.9 % IV SOLN
INTRAVENOUS | Status: DC
  Administered 2015-04-18: 13:00:00 via INTRAVENOUS

## 2015-04-18 MED ORDER — SODIUM CHLORIDE 0.9 % IV SOLN
INTRAVENOUS | Status: DC
  Administered 2015-04-18: 1000 mL via INTRAVENOUS

## 2015-04-18 MED ORDER — PROPOFOL 10 MG/ML IV BOLUS
INTRAVENOUS | Status: DC | PRN
  Administered 2015-04-18: 100 mg via INTRAVENOUS
  Administered 2015-04-18: 20 mg via INTRAVENOUS

## 2015-04-18 NOTE — Transfer of Care (Signed)
Immediate Anesthesia Transfer of Care Note  Patient: [name]  Procedure(s) Performed: Procedure(s): ESOPHAGOGASTRODUODENOSCOPY (EGD) WITH PROPOFOL (N/A)  Patient Location: PACU and Endoscopy Unit  Anesthesia Type:General  Level of Consciousness: sedated and responds to stimulation  Airway & Oxygen Therapy: Patient Spontanous Breathing and Patient connected to nasal cannula oxygen  Post-op Assessment: Report given to RN and Post -op Vital signs reviewed and stable  Post vital signs: Reviewed and stable  Last Vitals:  Filed Vitals:   04/18/15 1335 04/18/15 1336  BP: 92/49 92/49  Pulse:  67  Temp: 36.4 C 35.6 C  Resp: 16 15    Complications: No apparent anesthesia complications

## 2015-04-18 NOTE — Anesthesia Postprocedure Evaluation (Signed)
Anesthesia Post Note  Patient: [name]  Procedure(s) Performed: Procedure(s) (LRB): ESOPHAGOGASTRODUODENOSCOPY (EGD) WITH PROPOFOL (N/A)  Patient location during evaluation: Endoscopy Anesthesia Type: General Level of consciousness: awake and alert Pain management: pain level controlled Vital Signs Assessment: post-procedure vital signs reviewed and stable Respiratory status: spontaneous breathing and respiratory function stable Cardiovascular status: stable Anesthetic complications: no    Last Vitals:  Filed Vitals:   04/18/15 1356 04/18/15 1406  BP: 103/55 115/61  Pulse: 71 73  Temp:    Resp: 14 18    Last Pain: There were no vitals filed for this visit.               Alrick Cubbage K

## 2015-04-18 NOTE — Anesthesia Preprocedure Evaluation (Signed)
Anesthesia Evaluation  Patient identified by MRN, date of birth, ID band Patient awake    Reviewed: Allergy & Precautions, NPO status , Patient's Chart, lab work & pertinent test results  History of Anesthesia Complications Negative for: history of anesthetic complications  Airway Mallampati: III       Dental  (+) Upper Dentures   Pulmonary neg pulmonary ROS, former smoker,           Cardiovascular hypertension, Pt. on medications + Peripheral Vascular Disease       Neuro/Psych negative neurological ROS     GI/Hepatic negative GI ROS, Neg liver ROS,   Endo/Other  diabetes, Insulin Dependent  Renal/GU Dialysis and CRFRenal disease     Musculoskeletal   Abdominal   Peds  Hematology  (+) anemia ,   Anesthesia Other Findings   Reproductive/Obstetrics                             Anesthesia Physical Anesthesia Plan  ASA: IV  Anesthesia Plan: General   Post-op Pain Management:    Induction: Intravenous  Airway Management Planned: Nasal Cannula  Additional Equipment:   Intra-op Plan:   Post-operative Plan:   Informed Consent: I have reviewed the patients History and Physical, chart, labs and discussed the procedure including the risks, benefits and alternatives for the proposed anesthesia with the patient or authorized representative who has indicated his/her understanding and acceptance.     Plan Discussed with:   Anesthesia Plan Comments:         Anesthesia Quick Evaluation

## 2015-04-18 NOTE — H&P (Signed)
  Date of Initial H&P: 03/21/2015  History reviewed, patient examined, no change in status, stable for surgery.

## 2015-04-18 NOTE — Op Note (Signed)
[facility] Gastroenterology Patient Name: [name] Procedure Date: 04/18/2015 1:18 PM MRN: [id number] Account #: [id number] Date of Birth: [birth year] Admit Type: Outpatient Age: 81 Room: [facility] ENDO ROOM 4 Gender: Male Note Status: Finalized Procedure:         Upper GI endoscopy Indications:       Iron deficiency anemia secondary to chronic blood loss,                     Melena Providers:         Lupita Dawn. Candace Cruise, MD Medicines:         Monitored Anesthesia Care Complications:     No immediate complications. Procedure:         Pre-Anesthesia Assessment:                    - Prior to the procedure, a History and Physical was                     performed, and patient medications, allergies and                     sensitivities were reviewed. The patient's tolerance of                     previous anesthesia was reviewed.                    - The risks and benefits of the procedure and the sedation                     options and risks were discussed with the patient. All                     questions were answered and informed consent was obtained.                    - After reviewing the risks and benefits, the patient was                     deemed in satisfactory condition to undergo the procedure.                    After obtaining informed consent, the endoscope was passed                     under direct vision. Throughout the procedure, the                     patient's blood pressure, pulse, and oxygen saturations                     were monitored continuously. The Endoscope was introduced                     through the mouth, and advanced to the second part of                     duodenum. The upper GI endoscopy was accomplished without                     difficulty. The patient tolerated the procedure well. Findings:      The examined esophagus was normal.      The examined esophagus was normal.      The  entire examined stomach was normal.      A single  small angiodysplastic lesion was found in the first part of the       duodenum. Coagulation for hemostasis using heater probe was successful.      The exam was otherwise without abnormality. Impression:        - Normal esophagus.                    - Normal esophagus.                    - Normal stomach.                    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 Treated                     with a heater probe.                    - The examination was otherwise normal.                    - No specimens collected. Recommendation:    - Discharge patient to home.                    - Observe patient's clinical course.                    - The findings and recommendations were discussed with the                     patient's family. Procedure Code(s): --- Professional ---                    7402794126, Esophagogastroduodenoscopy, flexible, transoral;                     with control of bleeding, any method Diagnosis Code(s): --- Professional ---                    [id number], Angiodysplasia of stomach and duodenum without                     bleeding                    D50.0, Iron deficiency anemia secondary to blood loss                     (chronic)                    K92.1, Melena CPT copyright 2014 American Medical Association. All rights reserved. The codes documented in this report are preliminary and upon coder review may  be revised to meet current compliance requirements. Hulen Luster, MD 04/18/2015 1:33:27 PM This report has been signed electronically. Number of Addenda: 0 Note Initiated On: 04/18/2015 1:18 PM Total Procedure Duration: 0 hours 5 minutes 31 seconds       [facility]

## 2015-04-22 ENCOUNTER — Encounter: Payer: Self-pay | Admitting: Gastroenterology

## 2015-07-07 IMAGING — CR DG FEMUR 2V*L*
1 series · 2 of 2 positions shown · non-contrast
Comparison: none

REASON FOR EXAM: pain s/p fall
COMMENTS:

[Series 1: t femur proximal ap left · 0.14mm/px · 2 of 2 slices shown]
[im 1/2]
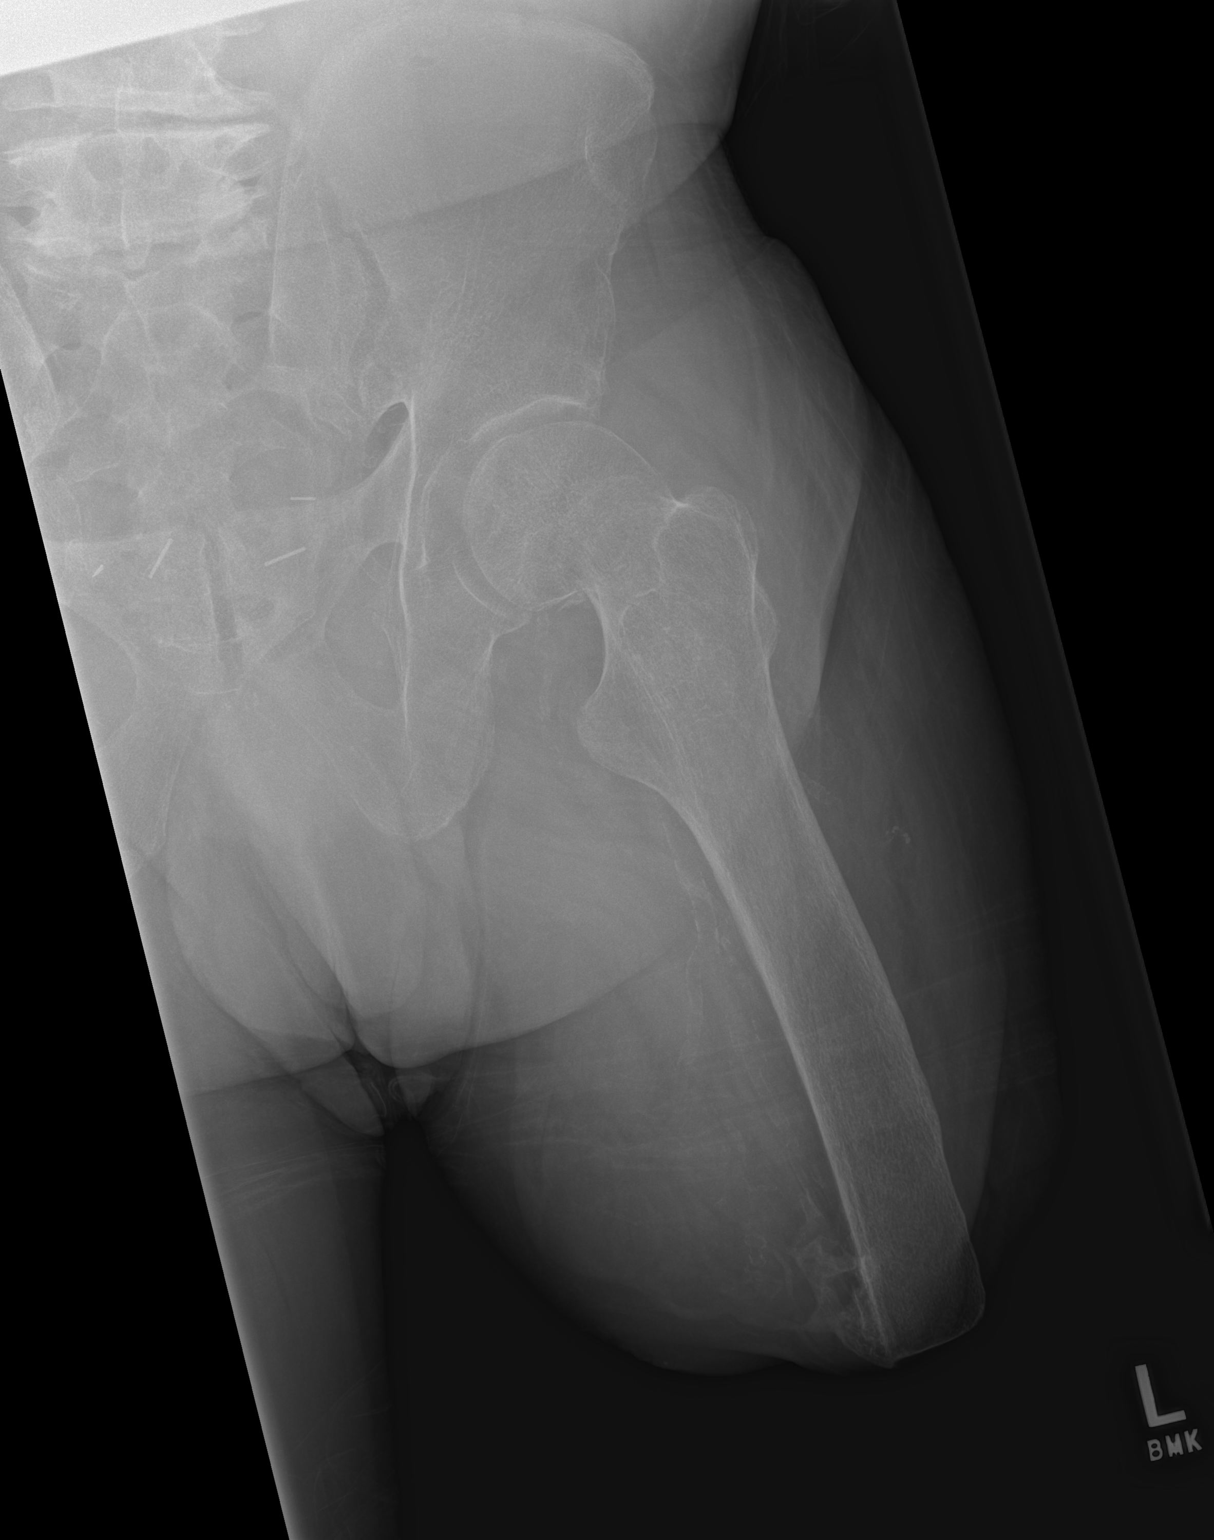
[im 2/2]
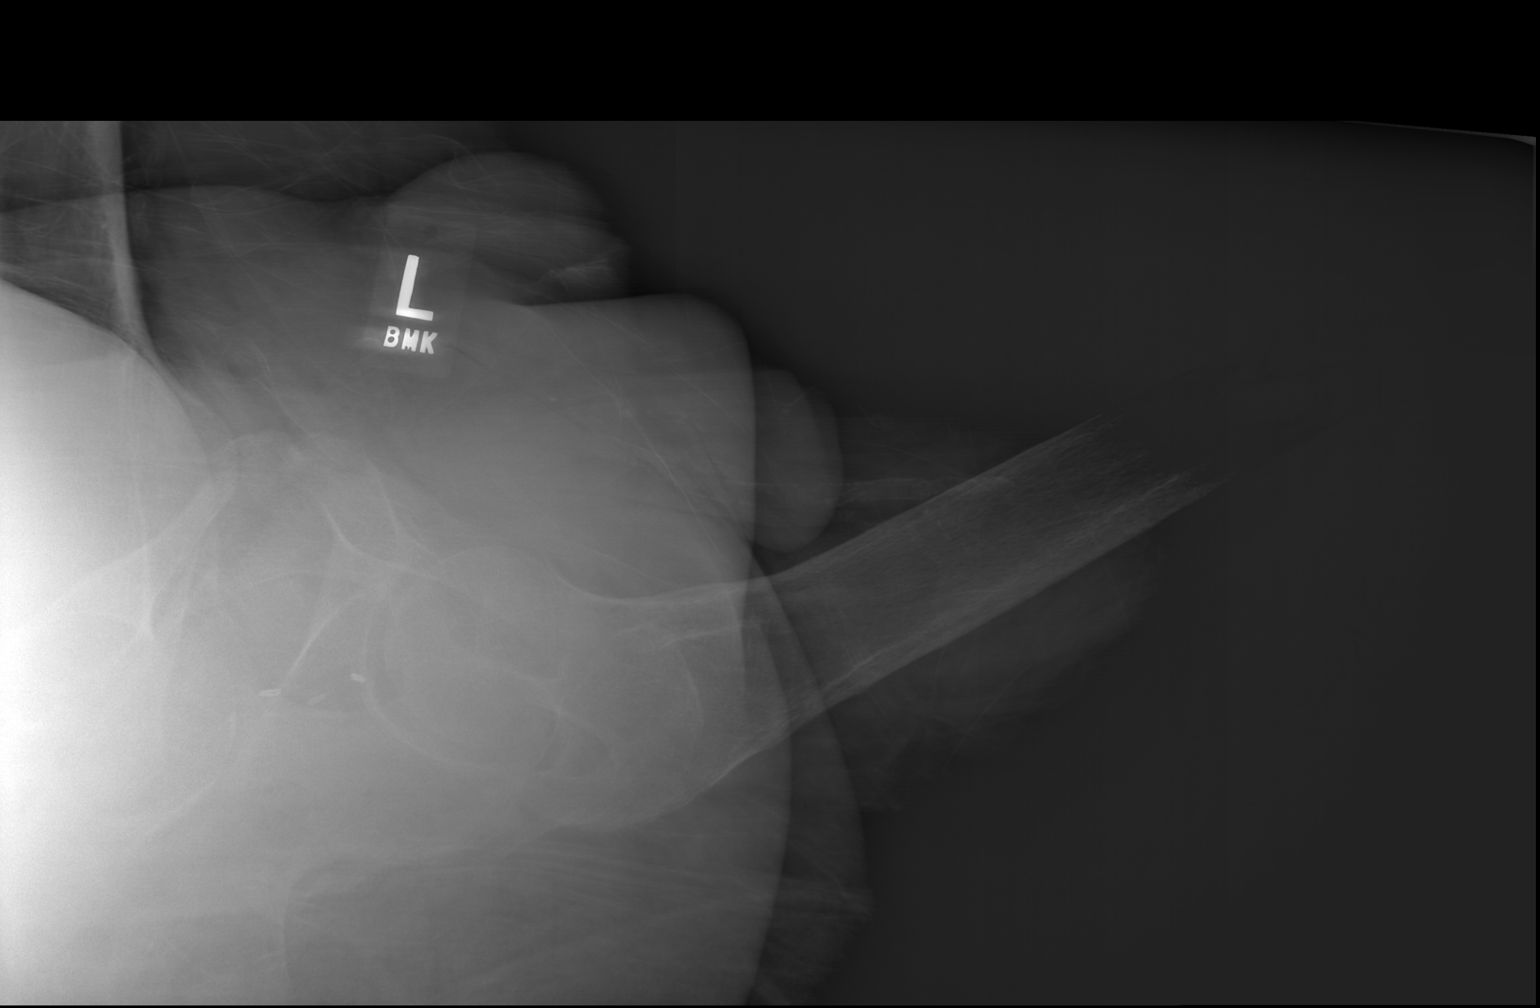

[2 of 2 positions shown; findings below may reference images not displayed]

PROCEDURE:     DXR - DXR FEMUR LEFT  - December 27, 2012 [DATE]

RESULT:     AP and lateral views of the left hip demonstrate limited
positioning due to the patient's clinical status. An previous above-the-knee
amputation. There is no definite acute fracture of the femoral head or neck
or intertrochanteric region. I cannot exclude minimal subcapital cortical
irregularity however which could reflect a mildly impacted and slightly
angulated fracture at this site. The observed portions of the left
hemipelvis appear normal.
IMPRESSION: No definite acute fracture is demonstrated. However, given
the osteopenia present and subcapital cortical irregularity an occult
fracture may be present. Followup CT scanning is available if there are
clinical symptoms that suggest an acute fracture.

[REDACTED]

## 2016-03-09 ENCOUNTER — Emergency Department: Payer: Medicare (Managed Care)

## 2016-03-09 ENCOUNTER — Encounter: Payer: Self-pay | Admitting: Emergency Medicine

## 2016-03-09 ENCOUNTER — Inpatient Hospital Stay
Admission: EM | Admit: 2016-03-09 | Discharge: 2016-03-11 | DRG: 291 | Disposition: A | Discharge status: Home / Self Care | Payer: Medicare (Managed Care) | Attending: Internal Medicine | Admitting: Internal Medicine

## 2016-03-09 DIAGNOSIS — E1151 Type 2 diabetes mellitus with diabetic peripheral angiopathy without gangrene: Secondary | ICD-10-CM | POA: Diagnosis present

## 2016-03-09 DIAGNOSIS — I5033 Acute on chronic diastolic (congestive) heart failure: Secondary | ICD-10-CM | POA: Diagnosis present

## 2016-03-09 DIAGNOSIS — Z992 Dependence on renal dialysis: Secondary | ICD-10-CM

## 2016-03-09 DIAGNOSIS — Z89612 Acquired absence of left leg above knee: Secondary | ICD-10-CM

## 2016-03-09 DIAGNOSIS — R0902 Hypoxemia: Secondary | ICD-10-CM

## 2016-03-09 DIAGNOSIS — N186 End stage renal disease: Secondary | ICD-10-CM | POA: Diagnosis present

## 2016-03-09 DIAGNOSIS — N2581 Secondary hyperparathyroidism of renal origin: Secondary | ICD-10-CM | POA: Diagnosis present

## 2016-03-09 DIAGNOSIS — R7989 Other specified abnormal findings of blood chemistry: Secondary | ICD-10-CM

## 2016-03-09 DIAGNOSIS — H409 Unspecified glaucoma: Secondary | ICD-10-CM | POA: Diagnosis present

## 2016-03-09 DIAGNOSIS — J9601 Acute respiratory failure with hypoxia: Secondary | ICD-10-CM | POA: Diagnosis present

## 2016-03-09 DIAGNOSIS — E1142 Type 2 diabetes mellitus with diabetic polyneuropathy: Secondary | ICD-10-CM | POA: Diagnosis present

## 2016-03-09 DIAGNOSIS — E1122 Type 2 diabetes mellitus with diabetic chronic kidney disease: Secondary | ICD-10-CM | POA: Diagnosis present

## 2016-03-09 DIAGNOSIS — R778 Other specified abnormalities of plasma proteins: Secondary | ICD-10-CM

## 2016-03-09 DIAGNOSIS — Z87891 Personal history of nicotine dependence: Secondary | ICD-10-CM | POA: Diagnosis not present

## 2016-03-09 DIAGNOSIS — H543 Unqualified visual loss, both eyes: Secondary | ICD-10-CM | POA: Diagnosis present

## 2016-03-09 DIAGNOSIS — D638 Anemia in other chronic diseases classified elsewhere: Secondary | ICD-10-CM | POA: Diagnosis present

## 2016-03-09 DIAGNOSIS — I132 Hypertensive heart and chronic kidney disease with heart failure and with stage 5 chronic kidney disease, or end stage renal disease: Secondary | ICD-10-CM | POA: Diagnosis present

## 2016-03-09 DIAGNOSIS — D472 Monoclonal gammopathy: Secondary | ICD-10-CM | POA: Diagnosis present

## 2016-03-09 DIAGNOSIS — I248 Other forms of acute ischemic heart disease: Secondary | ICD-10-CM | POA: Diagnosis present

## 2016-03-09 DIAGNOSIS — Z7982 Long term (current) use of aspirin: Secondary | ICD-10-CM

## 2016-03-09 DIAGNOSIS — Z833 Family history of diabetes mellitus: Secondary | ICD-10-CM

## 2016-03-09 DIAGNOSIS — Z79899 Other long term (current) drug therapy: Secondary | ICD-10-CM

## 2016-03-09 DIAGNOSIS — E876 Hypokalemia: Secondary | ICD-10-CM | POA: Diagnosis present

## 2016-03-09 DIAGNOSIS — I509 Heart failure, unspecified: Secondary | ICD-10-CM

## 2016-03-09 DIAGNOSIS — Z794 Long term (current) use of insulin: Secondary | ICD-10-CM

## 2016-03-09 LAB — BLOOD GAS, ARTERIAL
ACID-BASE EXCESS: 8.7 mmol/L — AB (ref 0.0–2.0)
ALLENS TEST (PASS/FAIL): POSITIVE — AB
Bicarbonate: 33.5 mmol/L — ABNORMAL HIGH (ref 20.0–28.0)
FIO2: 0.28
O2 SAT: 87.5 %
PCO2 ART: 46 mmHg (ref 32.0–48.0)
PH ART: 7.47 — AB (ref 7.350–7.450)
Patient temperature: 37
pO2, Arterial: 50 mmHg — ABNORMAL LOW (ref 83.0–108.0)

## 2016-03-09 LAB — BASIC METABOLIC PANEL
ANION GAP: 12 (ref 5–15)
BUN: 16 mg/dL (ref 6–20)
CO2: 31 mmol/L (ref 22–32)
Calcium: 9.1 mg/dL (ref 8.9–10.3)
Chloride: 97 mmol/L — ABNORMAL LOW (ref 101–111)
Creatinine, Ser: 4.19 mg/dL — ABNORMAL HIGH (ref 0.61–1.24)
GFR calc non Af Amer: 12 mL/min — ABNORMAL LOW (ref >60)
GFR, EST AFRICAN AMERICAN: 14 mL/min — AB (ref >60)
GLUCOSE: 97 mg/dL (ref 65–99)
POTASSIUM: 3 mmol/L — AB (ref 3.5–5.1)
Sodium: 140 mmol/L (ref 135–145)

## 2016-03-09 LAB — CBC
HEMATOCRIT: 34.6 % — AB (ref 40.0–52.0)
HEMOGLOBIN: 11.2 g/dL — AB (ref 13.0–18.0)
MCH: 23.4 pg — ABNORMAL LOW (ref 26.0–34.0)
MCHC: 32.4 g/dL (ref 32.0–36.0)
MCV: 72.2 fL — ABNORMAL LOW (ref 80.0–100.0)
Platelets: 194 10*3/uL (ref 150–440)
RBC: 4.8 MIL/uL (ref 4.40–5.90)
RDW: 15.6 % — ABNORMAL HIGH (ref 11.5–14.5)
WBC: 6.2 10*3/uL (ref 3.8–10.6)

## 2016-03-09 LAB — INFLUENZA PANEL BY PCR (TYPE A & B)
INFLAPCR: NEGATIVE
Influenza B By PCR: NEGATIVE

## 2016-03-09 LAB — GLUCOSE, CAPILLARY: GLUCOSE-CAPILLARY: 153 mg/dL — AB (ref 65–99)

## 2016-03-09 LAB — BRAIN NATRIURETIC PEPTIDE: B NATRIURETIC PEPTIDE 5: 2986 pg/mL — AB (ref 0.0–100.0)

## 2016-03-09 LAB — TROPONIN I: TROPONIN I: 0.67 ng/mL — AB (ref <0.03)

## 2016-03-09 MED ORDER — BISACODYL 5 MG PO TBEC: 5.0000 mg | DELAYED_RELEASE_TABLET | Freq: Every day | ORAL | Status: DC | PRN

## 2016-03-09 MED ORDER — ASPIRIN 81 MG PO CHEW
324.0000 mg | CHEWABLE_TABLET | Freq: Once | ORAL | Status: AC
  Administered 2016-03-09: 324 mg via ORAL
  Filled 2016-03-09: qty 4

## 2016-03-09 MED ORDER — ONDANSETRON HCL 4 MG PO TABS: 4.0000 mg | ORAL_TABLET | Freq: Four times a day (QID) | ORAL | Status: DC | PRN

## 2016-03-09 MED ORDER — INSULIN ASPART 100 UNIT/ML ~~LOC~~ SOLN: 0.0000 [IU] | Freq: Every day | SUBCUTANEOUS | Status: DC

## 2016-03-09 MED ORDER — ALBUTEROL SULFATE (2.5 MG/3ML) 0.083% IN NEBU: 2.5000 mg | INHALATION_SOLUTION | Freq: Four times a day (QID) | RESPIRATORY_TRACT | Status: DC | PRN

## 2016-03-09 MED ORDER — LISINOPRIL 20 MG PO TABS
40.0000 mg | ORAL_TABLET | Freq: Every day | ORAL | Status: DC
  Administered 2016-03-10 – 2016-03-11 (×2): 40 mg via ORAL
  Filled 2016-03-09 (×2): qty 2

## 2016-03-09 MED ORDER — AMLODIPINE BESYLATE 5 MG PO TABS
5.0000 mg | ORAL_TABLET | Freq: Every day | ORAL | Status: DC
  Administered 2016-03-10 – 2016-03-11 (×2): 5 mg via ORAL
  Filled 2016-03-09 (×2): qty 1

## 2016-03-09 MED ORDER — GABAPENTIN 300 MG PO CAPS
300.0000 mg | ORAL_CAPSULE | Freq: Three times a day (TID) | ORAL | Status: DC
  Administered 2016-03-09 – 2016-03-11 (×5): 300 mg via ORAL
  Filled 2016-03-09 (×5): qty 1

## 2016-03-09 MED ORDER — SODIUM CHLORIDE 0.9% FLUSH
3.0000 mL | Freq: Two times a day (BID) | INTRAVENOUS | Status: DC
  Administered 2016-03-09 – 2016-03-11 (×4): 3 mL via INTRAVENOUS

## 2016-03-09 MED ORDER — RENA-VITE PO TABS
1.0000 | ORAL_TABLET | Freq: Every day | ORAL | Status: DC
  Administered 2016-03-10 – 2016-03-11 (×2): 1 via ORAL
  Filled 2016-03-09 (×2): qty 1

## 2016-03-09 MED ORDER — SENNOSIDES-DOCUSATE SODIUM 8.6-50 MG PO TABS: 1.0000 | ORAL_TABLET | Freq: Every evening | ORAL | Status: DC | PRN

## 2016-03-09 MED ORDER — SODIUM CHLORIDE 0.9% FLUSH: 3.0000 mL | INTRAVENOUS | Status: DC | PRN

## 2016-03-09 MED ORDER — ONDANSETRON HCL 4 MG/2ML IJ SOLN: 4.0000 mg | Freq: Four times a day (QID) | INTRAMUSCULAR | Status: DC | PRN

## 2016-03-09 MED ORDER — SEVELAMER CARBONATE 800 MG PO TABS
1600.0000 mg | ORAL_TABLET | Freq: Three times a day (TID) | ORAL | Status: DC
  Administered 2016-03-10 – 2016-03-11 (×5): 1600 mg via ORAL
  Filled 2016-03-09 (×5): qty 2

## 2016-03-09 MED ORDER — IOPAMIDOL (ISOVUE-370) INJECTION 76%
75.0000 mL | Freq: Once | INTRAVENOUS | Status: AC | PRN
  Administered 2016-03-09: 75 mL via INTRAVENOUS

## 2016-03-09 MED ORDER — MAGNESIUM CITRATE PO SOLN
1.0000 | Freq: Once | ORAL | Status: DC | PRN
  Filled 2016-03-09: qty 296

## 2016-03-09 MED ORDER — INSULIN ASPART 100 UNIT/ML ~~LOC~~ SOLN
0.0000 [IU] | Freq: Three times a day (TID) | SUBCUTANEOUS | Status: DC
  Administered 2016-03-10: 2 [IU] via SUBCUTANEOUS
  Administered 2016-03-11 (×2): 1 [IU] via SUBCUTANEOUS
  Administered 2016-03-11: 2 [IU] via SUBCUTANEOUS
  Filled 2016-03-09: qty 2
  Filled 2016-03-09 (×2): qty 1
  Filled 2016-03-09: qty 2

## 2016-03-09 MED ORDER — ASPIRIN EC 81 MG PO TBEC
81.0000 mg | DELAYED_RELEASE_TABLET | Freq: Every day | ORAL | Status: DC
  Administered 2016-03-10 – 2016-03-11 (×2): 81 mg via ORAL
  Filled 2016-03-09 (×2): qty 1

## 2016-03-09 MED ORDER — ACETAMINOPHEN 500 MG PO TABS: 500.0000 mg | ORAL_TABLET | Freq: Four times a day (QID) | ORAL | Status: DC | PRN

## 2016-03-09 MED ORDER — INSULIN GLARGINE 100 UNIT/ML ~~LOC~~ SOLN
5.0000 [IU] | Freq: Every day | SUBCUTANEOUS | Status: DC
  Administered 2016-03-11: 5 [IU] via SUBCUTANEOUS
  Filled 2016-03-09 (×3): qty 0.05

## 2016-03-09 MED ORDER — HEPARIN SODIUM (PORCINE) 5000 UNIT/ML IJ SOLN
5000.0000 [IU] | Freq: Three times a day (TID) | INTRAMUSCULAR | Status: DC
  Administered 2016-03-09: 5000 [IU] via SUBCUTANEOUS
  Filled 2016-03-09: qty 1

## 2016-03-09 MED ORDER — SODIUM CHLORIDE 0.9 % IV SOLN: 250.0000 mL | INTRAVENOUS | Status: DC | PRN

## 2016-03-09 MED ORDER — OXYCODONE HCL 5 MG PO TABS: 5.0000 mg | ORAL_TABLET | ORAL | Status: DC | PRN

## 2016-03-09 MED ORDER — METOPROLOL TARTRATE 50 MG PO TABS
50.0000 mg | ORAL_TABLET | Freq: Every day | ORAL | Status: DC
  Administered 2016-03-10 – 2016-03-11 (×2): 50 mg via ORAL
  Filled 2016-03-09 (×2): qty 1

## 2016-03-09 MED ORDER — CLONIDINE HCL 0.1 MG PO TABS
0.1000 mg | ORAL_TABLET | Freq: Every day | ORAL | Status: DC | PRN
  Administered 2016-03-10: 0.1 mg via ORAL
  Filled 2016-03-09: qty 1

## 2016-03-09 NOTE — ED Notes (Addendum)
Critical tropoin 0.67, MD made aware

## 2016-03-09 NOTE — ED Provider Notes (Signed)
[facility] Emergency Department Provider Note  ____________________________________________  Time seen: Approximately 4:53 PM  I have reviewed the triage vital signs and the nursing notes.   HISTORY  Chief Complaint Cough    HPI [name] is a 81 y.o. male with a history of ESRD on HD, HTN, PVD, presenting for shortness of breath. The patient reports that he was at dialysis on Friday and had a brief episode of shortness of breath. He did well over the weekend but at hemodialysis today, he again became short of breath. He was sent here by EMS due to oxygen saturations in the 70s that improved with 2 L nasal cannula. The patient denies any chest pain, palpitations, lightheadedness. He has had a nonproductive cough without any fevers, chills, ear pain, sore throat, congestion or rhinorrhea. He has not noted any lower external swelling in the right lower leg, nor calf pain. He has no personal or family history of blood clots.   Past Medical History:  Diagnosis Date  . Anemia of chronic disease   . 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 Blind   . Blindness of both eyes   . Blood in stool   . Chronic kidney disease   . CKD (chronic kidney disease)   . Diabetes mellitus without complication ([place])   . Edentulism   . ESRD (end stage renal disease) ([place])   . ESRD (end stage renal disease) ([place])   . Fecal incontinence   . Glaucoma   . History of fall   . Hypertension   . Iritis   . MGUS (monoclonal gammopathy of unknown significance)   . Nocturnal cough   . Peripheral vascular disease ([place])   . Pneumonia   . Protein calorie malnutrition ([place])   . PVD (peripheral vascular disease) ([place])   . Renal cyst     Patient Active Problem List   Diagnosis Date Noted  . Pneumonia 11/10/2014  . Hypoxia 11/10/2014  . Fluid overload 11/10/2014  . Elevated troponin 11/10/2014  . Healthcare-associated pneumonia 10/28/2014    Past Surgical  History:  Procedure Laterality Date  . ESOPHAGOGASTRODUODENOSCOPY (EGD) WITH PROPOFOL N/A 04/18/2015   Procedure: ESOPHAGOGASTRODUODENOSCOPY (EGD) WITH PROPOFOL;  Surgeon: Hulen Luster, MD;  Location: [facility] ENDOSCOPY;  Service: Gastroenterology;  Laterality: N/A;  . LEG AMPUTATION ABOVE KNEE  left  . PROSTATECTOMY      Current Outpatient Rx  . Order #: [id number] Class: Normal  . Order #: [id number] Class: Normal  . Order #: [id number] Class: Historical Med  . Order #: [id number] Class: Historical Med  . Order #: [id number] Class: Historical Med  . Order #: [id number] Class: Historical Med  . Order #: [id number] Class: Historical Med  . Order #: [id number] Class: Historical Med  . Order #: [id number] Class: Historical Med  . Order #: [id number] Class: Historical Med  . Order #: [id number] Class: Print  . Order #: [id number] Class: Normal  . Order #: [id number] Class: Normal  . Order #: [id number] Class: Historical Med    Allergies Patient has no known allergies.  Family History  Problem Relation Age of Onset  . Diabetes Mother   . Diabetes Father     Social History Social History  Substance Use Topics  . Smoking status: Former Research scientist (life sciences)  . Smokeless tobacco: Not on file  . Alcohol use No    Review of Systems Constitutional: No fever/chills.No lightheadedness or syncope. Eyes: Blindness in both eyes.. No eye discharge. ENT: No sore throat. No congestion or rhinorrhea. No ear pain. Cardiovascular: Denies chest  pain. Denies palpitations. Respiratory: Positive shortness of breath.  No cough. Gastrointestinal: No abdominal pain.  No nausea, no vomiting.  No diarrhea.  No constipation. Genitourinary: Does not make urine. Musculoskeletal: Negative for back pain. No right lower extremity swelling or calf pain. Skin: Negative for rash. Neurological: Negative for headaches. No focal numbness, tingling or weakness.   10-point ROS otherwise  negative.  ____________________________________________   PHYSICAL EXAM:  VITAL SIGNS: ED Triage Vitals  Enc Vitals Group     BP      Pulse      Resp      Temp      Temp src      SpO2      Weight      Height      Head Circumference      Peak Flow      Pain Score      Pain Loc      Pain Edu?      Excl. in [place]?     Constitutional: Alert and oriented. Chronically ill appearing but nontoxic.Marland Kitchen Answers questions appropriately. Eyes: Conjunctivae are normal.  EOMI. No scleral icterus. Head: Atraumatic. Nose: No congestion/rhinnorhea. Mouth/Throat: Mucous membranes are moist.  Neck: No stridor.  Supple.  No JVD. Cardiovascular: Normal rate, regular rhythm. Occasional extra beats. No murmurs, rubs or gallops.  Respiratory: Normal respiratory effort.  No accessory muscle use or retractions. Lungs CTAB.  No wheezes, rales or ronchi. Intermittent dry cough. Thick nails, is difficult to get a good oxygen tracing on this patient, when there is a good tracing, his oxygen saturations remain between 90-95%. Gastrointestinal: Soft, nontender and nondistended.  No guarding or rebound.  No peritoneal signs. Musculoskeletal: Left AKA. No right LE edema. No ttp in the calves or palpable cords in the right.  Negative Homan's sign in the right. Neurologic:  A&Ox3.  Speech is clear.  Face and smile are symmetric.  EOMI.  Moves all extremities well. Skin:  Skin is warm, dry and intact. No rash noted. Psychiatric: Mood and affect are normal. Speech and behavior are normal.  Normal judgement  ____________________________________________   LABS (all labs ordered are listed, but only abnormal results are displayed)  Labs Reviewed  CBC - Abnormal; Notable for the following:       Result Value   Hemoglobin 11.2 (*)    HCT 34.6 (*)    MCV 72.2 (*)    MCH 23.4 (*)    RDW 15.6 (*)    All other components within normal limits  BASIC METABOLIC PANEL - Abnormal; Notable for the following:     Potassium 3.0 (*)    Chloride 97 (*)    Creatinine, Ser 4.19 (*)    GFR calc non Af Amer 12 (*)    GFR calc Af Amer 14 (*)    All other components within normal limits  BLOOD GAS, ARTERIAL - Abnormal; Notable for the following:    pH, Arterial 7.47 (*)    pO2, Arterial 50 (*)    Bicarbonate 33.5 (*)    Acid-Base Excess 8.7 (*)    Allens test (pass/fail) POSITIVE (*)    All other components within normal limits  TROPONIN I - Abnormal; Notable for the following:    Troponin I 0.67 (*)    All other components within normal limits  BRAIN NATRIURETIC PEPTIDE - Abnormal; Notable for the following:    B Natriuretic Peptide 2,986.0 (*)    All other components within normal limits  INFLUENZA  PANEL BY PCR (TYPE A & B, H1N1)   ____________________________________________  EKG  ED ECG REPORT I, Eula Listen, the attending physician, personally viewed and interpreted this ECG.   Date: 03/09/2016  EKG Time: 1653  Rate: 97  Rhythm: normal sinus rhythm with frequent PVC's  Axis: leftward  Intervals:none  ST&T Change: No ST elevation.  ____________________________________________  RADIOLOGY  Dg Chest 2 View  Result Date: 03/09/2016 CLINICAL DATA:  Patient states he was sent to ED from dialysis for increased SOB. Patient has a HX of chronic kidney disease, end stage renal failure, HTN, and diabetes. EXAM: CHEST  2 VIEW COMPARISON:  11/13/2014 FINDINGS: The cardiac silhouette is borderline enlarged. No mediastinal or hilar masses. No evidence of adenopathy. There are prominent bronchovascular markings and mild interstitial thickening most evident in the lung bases. No definite pleural effusion. No pneumothorax. Skeletal structures are demineralized but grossly intact. IMPRESSION: 1. Mild interstitial thickening and prominent bronchovascular markings. Although this may all be chronic, mild interstitial edema is suspected. Electronically Signed   By: Lajean Manes M.D.   On: 03/09/2016  17:51   Ct Angio Chest Pe W And/or Wo Contrast  Result Date: 03/09/2016 CLINICAL DATA:  Hypoxia after dialysis.  Dyspnea and cough. EXAM: CT ANGIOGRAPHY CHEST WITH CONTRAST TECHNIQUE: Multidetector CT imaging of the chest was performed using the standard protocol during bolus administration of intravenous contrast. Multiplanar CT image reconstructions and MIPs were obtained to evaluate the vascular anatomy. CONTRAST:  75 cc of Isovue 370 IV COMPARISON:  CXR 03/09/2016, chest CT 11/10/2014 acute and new FINDINGS: Cardiovascular: No acute pulmonary embolus. There is aortic atherosclerosis without aneurysm nor dissection. Bovine arch anatomy with common trunk for the brachiocephalic and left common carotid arteries. Calcified left subclavian artery origin. Heart is top-normal in size without pericardial effusion. There is coronary arteriosclerosis. Mediastinum/Nodes: Calcified mediastinal and right hilar lymph nodes are again noted consistent with old granulomatous disease. No significant change noted. The trachea, mainstem bronchi and esophagus are stable without acute appearing abnormalities. No thyromegaly. Lungs/Pleura: Left greater than right bilateral pleural effusions with adjacent compressive atelectasis are noted small on the right and small to moderate on the left. Subsegmental atelectasis noted in both bone lower lobes. Patchy airspace opacities in the upper lobes, lingula, right middle lobe and both lower lobes may reflect changes from CHF. Respiratory motion artifact slightly limit assessment. Upper Abdomen: Left kidney is not visualized and may be atrophic or excluded on this study. Gallbladder is physiologically distended and appears to contain a few dependent calculi, series 4, image 94. No biliary dilatation. Hypodensity along the falciform ligament is likely related to partial volume averaging of the leaves of the falciform ligament. Musculoskeletal: The dorsal spine appears diffusely dense  consistent with renal osteodystrophy. Multilevel degenerative disc disease along the mid to lower thoracic spine. No acute osseous abnormality. Review of the MIP images confirms the above findings. IMPRESSION: 1. No acute pulmonary embolus. 2. Small right and small to moderate left pleural effusions with adjacent atelectasis. Mild bilateral patchy alveolar airspace opacities in both upper lobes, lower lobes, right middle lobe and lingula may reflect changes from pulmonary edema. 3. Calcified mediastinal and hilar lymph nodes consistent with old granulomatous disease unchanged in appearance. 4. Tiny dependent calculi noted of the gallbladder without secondary signs of acute cholecystitis. 5. Renal osteodystrophy is believed to account for the dense appearance of the vertebral bodies of the dorsal spine in this patient on dialysis. 6. Bovine arch anatomy.  Aortic atherosclerosis. 7.  Coronary arteriosclerosis. Electronically Signed   By: Ashley Royalty M.D.   On: 03/09/2016 19:08    ____________________________________________   PROCEDURES  Procedure(s) performed: None  Procedures  Critical Care performed: Yes ____________________________________________   INITIAL IMPRESSION / ASSESSMENT AND PLAN / ED COURSE  Pertinent labs & imaging results that were available during my care of the patient were reviewed by me and considered in my medical decision making (see chart for details).  81 y.o. M w/ hx of ESRD on HD and multiple other comorbid conditions, but no hx of COPD, asthma or PE, presenting for SOB and possible hypoxia.  It is not clear to me if the patient is actually hypoxic. It is extremely difficult to get a tracing on the pulse oximeter, but when I do have a tracing that is a good tracing, his oxygen saturations are normal. He has not had any other symptoms other than a mild cough without any fever. We'll get a chest x-ray and a CT to evaluate for PE. He is at risk for that due to his decreased  mobility. We'll get basic labs to rule out anemia or electrolyte disturbance. I will also get a troponin, and BNP although the patient has no history of congestive heart failure and has not been experiencing any chest discomfort. Plan reevaluation for final disposition.  ----------------------------------------- 5:02 PM on 03/09/2016 -----------------------------------------  I just reevaluated the patient and with a good tracing on his pulse oximeter, his oxygen is 8889% on 2 L nasal cannula. I have increased his oxygen and will reevaluate him. An ABG is pending.  CRITICAL CARE Performed by: Eula Listen   Total critical care time: 40 minutes  Critical care time was exclusive of separately billable procedures and treating other patients.  Critical care was necessary to treat or prevent imminent or life-threatening deterioration.  Critical care was time spent personally by me on the following activities: development of treatment plan with patient and/or surrogate as well as nursing, discussions with consultants, evaluation of patient's response to treatment, examination of patient, obtaining history from patient or surrogate, ordering and performing treatments and interventions, ordering and review of laboratory studies, ordering and review of radiographic studies, pulse oximetry and re-evaluation of patient's condition.  ----------------------------------------- 6:38 PM on 03/09/2016 -----------------------------------------  The patient's workup does show hypoxia, and an elevated troponin. He continues to be chest pain-free, but he may have ACS or MI with atypical symptoms due to his other comorbidities and risk factors. I will treat him with aspirin, and amylase the CT angiogram for final admission.  ----------------------------------------- 7:35 PM on 03/09/2016 -----------------------------------------  Patient CT is also consistent with pulmonary edema. He does have an  elevated troponin, and this may be from strain, or may be from a primary incident. Plan admission to the hospital at this time.  ____________________________________________  FINAL CLINICAL IMPRESSION(S) / ED DIAGNOSES  Final diagnoses:  Acute on chronic congestive heart failure, unspecified congestive heart failure type ([place])  Hypoxia  Elevated troponin    Clinical Course as of Mar 09 1934  Mon Mar 09, 2016  1756 The patient is currently saturating 100% on 4 L nasal cannula. His ABG, which was done on 3 L nasal clinic, does show hypoxemia, so the patient will require admission. I'm awaiting the results of his chest x-ray, his CT scan, and the rest of his blood work. He has a chronic anemia which is unchanged.  [AN]  1853 ED ECG REPORT I, Eula Listen, the attending physician, personally viewed  and interpreted this ECG.   Date: @EDTODAY @  EKG Time: 1849  Rate: 79  Rhythm: normal sinus rhythm  Axis: leftward  Intervals:Prolonged QTc  ST&T Change: 64mm ST elevation isolated to V1; decrease in PVC's compared to previous.   [AN]  [id number] Patient has shortness of breath with an elevated BNP, and pulmonary edema on his chest x-ray. He has not carried a diagnosis of CHF in the past, but this may be the cause of his symptoms. I'm still awaiting the results of his CT angiogram.  [AN]    Clinical Course User Index [AN] Eula Listen, MD      NEW MEDICATIONS STARTED DURING THIS VISIT:  New Prescriptions   No medications on file      Eula Listen, MD 03/09/16 1936

## 2016-03-09 NOTE — H&P (Addendum)
[place] @ [facility] Admission History and Physical [name], D.O.  ---------------------------------------------------------------------------------------------------------------------   PATIENT NAME: [name] MR#: [id number] DATE OF BIRTH: 03/27/1935 DATE OF ADMISSION: 03/09/2016 PRIMARY CARE PHYSICIAN: Rinaldo Cloud, MD  REQUESTING/REFERRING PHYSICIAN: ED Dr. Mariea Clonts  CHIEF COMPLAINT: Chief Complaint  Patient presents with  . Cough    HISTORY OF PRESENT ILLNESS: [name] is a 81 y.o. male with a known history of Diabetes, end-stage renal disease on hemodialysis, hypertension presents to the emergency department complaining of shortness of breath.  Patient was in a usual state of health until today following dialysis when he became acutely short of breath. He was transferred to the emergency department by EMS secondary to hypoxia with O2 sats in the low 70s that improved with 2 L of oxygen via nasal cannula. On questioning patient denies any recent illness, fevers, chills with the exception of a dry cough for the past week.  Otherwise there has been no change in status. Patient has been taking medication as prescribed and there has been no recent change in medication or diet.  There has been no recent illness, travel or sick contacts.   Patient denies fevers/chills, weakness, dizziness, chest pain,  N/V/C/D, abdominal pain, dysuria/frequency, changes in mental status.   PAST MEDICAL HISTORY: Past Medical History:  Diagnosis Date  . Anemia of chronic disease   . 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 Blind   . Blindness of both eyes   . Blood in stool   . Chronic kidney disease   . CKD (chronic kidney disease)   . Diabetes mellitus without complication ([place])   . Edentulism   . ESRD (end stage renal disease) ([place])   . ESRD (end stage renal disease) ([place])   . Fecal incontinence   . Glaucoma   . History of fall   .  Hypertension   . Iritis   . MGUS (monoclonal gammopathy of unknown significance)   . Nocturnal cough   . Peripheral vascular disease ([place])   . Pneumonia   . Protein calorie malnutrition ([place])   . PVD (peripheral vascular disease) ([place])   . Renal cyst       PAST SURGICAL HISTORY: Past Surgical History:  Procedure Laterality Date  . ESOPHAGOGASTRODUODENOSCOPY (EGD) WITH PROPOFOL N/A 04/18/2015   Procedure: ESOPHAGOGASTRODUODENOSCOPY (EGD) WITH PROPOFOL;  Surgeon: Hulen Luster, MD;  Location: [facility] ENDOSCOPY;  Service: Gastroenterology;  Laterality: N/A;  . LEG AMPUTATION ABOVE KNEE  left  . PROSTATECTOMY        SOCIAL HISTORY: Social History  Substance Use Topics  . Smoking status: Former Research scientist (life sciences)  . Smokeless tobacco: Not on file  . Alcohol use No      FAMILY HISTORY: Family History  Problem Relation Age of Onset  . Diabetes Mother   . Diabetes Father      MEDICATIONS AT HOME: Prior to Admission medications   Medication Sig Start Date End Date Taking? Authorizing Provider  acetaminophen (TYLENOL) 500 MG tablet Take 500 mg by mouth every 6 (six) hours as needed.   Yes Historical Provider, MD  amLODipine (NORVASC) 5 MG tablet Take 5 mg by mouth daily.   Yes Historical Provider, MD  aspirin EC 81 MG tablet Take 81 mg by mouth daily.   Yes Historical Provider, MD  cloNIDine (CATAPRES) 0.1 MG tablet Take 0.1 mg by mouth daily as needed. Give 0.1mg  po prn diastolic [id number] /or systolic b/p [id number]   Yes Historical Provider,  MD  gabapentin (NEURONTIN) 300 MG capsule Take 300 mg by mouth 3 (three) times daily.   Yes Historical Provider, MD  insulin glargine (LANTUS) 100 UNIT/ML injection Inject 5 Units into the skin daily.    Yes Historical Provider, MD  insulin NPH-regular Human (NOVOLIN 70/30) (70-30) 100 UNIT/ML injection Inject 0-12 Units into the skin daily.   Yes Historical Provider, MD  lisinopril (PRINIVIL,ZESTRIL) 40 MG tablet Take 40 mg by mouth daily.    Yes Historical Provider,  MD  metoprolol (LOPRESSOR) 50 MG tablet Take 50 mg by mouth daily.    Yes Historical Provider, MD  multivitamin (RENA-VIT) TABS tablet Take 1 tablet by mouth daily.   Yes Historical Provider, MD  sevelamer (RENAGEL) 800 MG tablet Take 1,600 mg by mouth 3 (three) times daily with meals.   Yes Historical Provider, MD      DRUG ALLERGIES: No Known Allergies   REVIEW OF SYSTEMS: CONSTITUTIONAL: No fatigue, weakness, fever, chills, weight gain/loss, headache EYES: No blurry or double vision. ENT: No tinnitus, postnasal drip, redness or soreness of the oropharynx. RESPIRATORY: Positive dyspnea, cough, negative wheeze, hemoptysis. CARDIOVASCULAR: No chest pain, orthopnea, palpitations, syncope. GASTROINTESTINAL: No nausea, vomiting, constipation, diarrhea, abdominal pain. No hematemesis, melena or hematochezia. GENITOURINARY: No dysuria, frequency, hematuria. ENDOCRINE: No polyuria or nocturia. No heat or cold intolerance. HEMATOLOGY: No anemia, bruising, bleeding. INTEGUMENTARY: No rashes, ulcers, lesions. MUSCULOSKELETAL: No pain, arthritis, swelling, gout. NEUROLOGIC: No numbness, tingling, weakness or ataxia. No seizure-type activity. PSYCHIATRIC: No anxiety, depression, insomnia.  PHYSICAL EXAMINATION: VITAL SIGNS: Blood pressure (!) 180/83, pulse 77, temperature 97.7 F (36.5 C), temperature source Oral, resp. rate 15, SpO2 100 %.  GENERAL: 81 y.o.-year-old black male patient, chronically ill-appearing, lying in the bed in no acute distress.  Pleasant and cooperative.   HEENT: Head atraumatic, normocephalic.No scleral icterus. Extraocular muscles intact. Oropharynx is clear. Mucus membranes moist. NECK: Supple, full range of motion. No JVD, no bruit heard. No cervical lymphadenopathy. CHEST: Diminished breath sounds at the bases No wheezing, rales, rhonchi or crackles. No use of accessory muscles of respiration.  No reproducible chest wall tenderness.  CARDIOVASCULAR: S1, S2 normal.  No murmurs, rubs, or gallops appreciated. Cap refill <2 seconds. ABDOMEN: Soft, nontender, nondistended. No rebound, guarding, rigidity. Normoactive bowel sounds present in all four quadrants. No organomegaly or mass. EXTREMITIES: Left AKA. No pedal edema, cyanosis, or clubbing. NEUROLOGIC: Cranial nerves II through XII are grossly intact with no focal sensorimotor deficit. Muscle strength 5/5 in all extremities. Sensation intact. Gait not checked. PSYCHIATRIC: The patient is alert and oriented x 3. Normal affect, mood, thought content. SKIN: Warm, dry, and intact without obvious rash, lesion, or ulcer.  LABORATORY PANEL:  CBC  Recent Labs Lab 03/09/16 1716  WBC 6.2  HGB 11.2*  HCT 34.6*  PLT 194   ----------------------------------------------------------------------------------------------------------------- Chemistries  Recent Labs Lab 03/09/16 1716  NA 140  K 3.0*  CL 97*  CO2 31  GLUCOSE 97  BUN 16  CREATININE 4.19*  CALCIUM 9.1   ------------------------------------------------------------------------------------------------------------------ Cardiac Enzymes  Recent Labs Lab 03/09/16 1716  TROPONINI 0.67*   ------------------------------------------------------------------------------------------------------------------  RADIOLOGY: Dg Chest 2 View  Result Date: 03/09/2016 CLINICAL DATA:  Patient states he was sent to ED from dialysis for increased SOB. Patient has a HX of chronic kidney disease, end stage renal failure, HTN, and diabetes. EXAM: CHEST  2 VIEW COMPARISON:  11/13/2014 FINDINGS: The cardiac silhouette is borderline enlarged. No mediastinal or hilar masses. No evidence of adenopathy. There are prominent bronchovascular markings  and mild interstitial thickening most evident in the lung bases. No definite pleural effusion. No pneumothorax. Skeletal structures are demineralized but grossly intact. IMPRESSION: 1. Mild interstitial thickening and prominent  bronchovascular markings. Although this may all be chronic, mild interstitial edema is suspected. Electronically Signed   By: Lajean Manes M.D.   On: 03/09/2016 17:51   Ct Angio Chest Pe W And/or Wo Contrast  Result Date: 03/09/2016 CLINICAL DATA:  Hypoxia after dialysis.  Dyspnea and cough. EXAM: CT ANGIOGRAPHY CHEST WITH CONTRAST TECHNIQUE: Multidetector CT imaging of the chest was performed using the standard protocol during bolus administration of intravenous contrast. Multiplanar CT image reconstructions and MIPs were obtained to evaluate the vascular anatomy. CONTRAST:  75 cc of Isovue 370 IV COMPARISON:  CXR 03/09/2016, chest CT 11/10/2014 acute and new FINDINGS: Cardiovascular: No acute pulmonary embolus. There is aortic atherosclerosis without aneurysm nor dissection. Bovine arch anatomy with common trunk for the brachiocephalic and left common carotid arteries. Calcified left subclavian artery origin. Heart is top-normal in size without pericardial effusion. There is coronary arteriosclerosis. Mediastinum/Nodes: Calcified mediastinal and right hilar lymph nodes are again noted consistent with old granulomatous disease. No significant change noted. The trachea, mainstem bronchi and esophagus are stable without acute appearing abnormalities. No thyromegaly. Lungs/Pleura: Left greater than right bilateral pleural effusions with adjacent compressive atelectasis are noted small on the right and small to moderate on the left. Subsegmental atelectasis noted in both bone lower lobes. Patchy airspace opacities in the upper lobes, lingula, right middle lobe and both lower lobes may reflect changes from CHF. Respiratory motion artifact slightly limit assessment. Upper Abdomen: Left kidney is not visualized and may be atrophic or excluded on this study. Gallbladder is physiologically distended and appears to contain a few dependent calculi, series 4, image 94. No biliary dilatation. Hypodensity along the  falciform ligament is likely related to partial volume averaging of the leaves of the falciform ligament. Musculoskeletal: The dorsal spine appears diffusely dense consistent with renal osteodystrophy. Multilevel degenerative disc disease along the mid to lower thoracic spine. No acute osseous abnormality. Review of the MIP images confirms the above findings. IMPRESSION: 1. No acute pulmonary embolus. 2. Small right and small to moderate left pleural effusions with adjacent atelectasis. Mild bilateral patchy alveolar airspace opacities in both upper lobes, lower lobes, right middle lobe and lingula may reflect changes from pulmonary edema. 3. Calcified mediastinal and hilar lymph nodes consistent with old granulomatous disease unchanged in appearance. 4. Tiny dependent calculi noted of the gallbladder without secondary signs of acute cholecystitis. 5. Renal osteodystrophy is believed to account for the dense appearance of the vertebral bodies of the dorsal spine in this patient on dialysis. 6. Bovine arch anatomy.  Aortic atherosclerosis. 7. Coronary arteriosclerosis. Electronically Signed   By: Ashley Royalty M.D.   On: 03/09/2016 19:08    EKG: Normal sinus rhythm at 97 bpm with forward axis, PVCs and nonspecific ST-T wave changes.   IMPRESSION AND PLAN:  This is a 81 y.o. male with a history of Diabetes, end-stage renal disease on hemodialysis, hypertension  now being admitted with: 1. New-onset congestive heart failure-admit to inpatient with telemetry monitoring, IV Lasix, daily weights, oxygen therapy and nebulizers as needed, I's and O's, echocardiogram, cardiology consult. Patient will likely need additional dialysis in the morning and as such renal has been consult did. 2. Elevated troponin, may be demand ischemia secondary to new onset of congestive heart failure or MI in the setting of end-stage renal disease however  we'll start heparin drip, trend troponins, check lipid panel. Patient is already on  aspirin, Lopressor, will start statin 3. Hypokalemia-we'll give 20 mEq by mouth. 4. History of hypertension-continue Norvasc, Catapres, lisinopril, Lopressor 5. History of end-stage renal disease on hemodialysis-continue Renvela 7. History of diabetes-insulin siding scale coverage 7. History of peripheral neuropathy-continue Neurontin   Diet/Nutrition: Nothing by mouth Fluids: Hep-Lock DVT Px: Heparin SCDs and early ambulation Code Status: Full  All the records are reviewed and case discussed with ED provider. Management plans discussed with the patient and/or family who express understanding and agree with plan of care.   TOTAL TIME TAKING CARE OF THIS PATIENT: 60 minutes.   Deniesha Stenglein D.O. on 03/09/2016 at 8:40 PM Between 7am to 6pm - Pager - [phone] After 6pm go to [email or web address] - [employer] [place] [facility] Office [phone] CC: Primary care physician; Rinaldo Cloud, MD     Note: This dictation was prepared with Dragon dictation along with smaller phrase technology. Any transcriptional errors that result from this process are unintentional.

## 2016-03-10 ENCOUNTER — Inpatient Hospital Stay
Admit: 2016-03-10 | Discharge: 2016-03-10 | Disposition: A | Discharge status: Home / Self Care | Payer: Medicare (Managed Care) | Attending: Cardiovascular Disease | Admitting: Cardiovascular Disease

## 2016-03-10 LAB — BLOOD GAS, ARTERIAL
Acid-Base Excess: 6.2 mmol/L — ABNORMAL HIGH (ref 0.0–2.0)
BICARBONATE: 31.2 mmol/L — AB (ref 20.0–28.0)
FIO2: 0.32
O2 SAT: 95.4 %
PCO2 ART: 46 mmHg (ref 32.0–48.0)
PO2 ART: 75 mmHg — AB (ref 83.0–108.0)
Patient temperature: 37
pH, Arterial: 7.44 (ref 7.350–7.450)

## 2016-03-10 LAB — COMPREHENSIVE METABOLIC PANEL
ALT: 28 U/L (ref 17–63)
AST: 35 U/L (ref 15–41)
Albumin: 3.7 g/dL (ref 3.5–5.0)
Alkaline Phosphatase: 55 U/L (ref 38–126)
Anion gap: 10 (ref 5–15)
BILIRUBIN TOTAL: 0.8 mg/dL (ref 0.3–1.2)
BUN: 26 mg/dL — AB (ref 6–20)
CO2: 29 mmol/L (ref 22–32)
CREATININE: 5.06 mg/dL — AB (ref 0.61–1.24)
Calcium: 9 mg/dL (ref 8.9–10.3)
Chloride: 98 mmol/L — ABNORMAL LOW (ref 101–111)
GFR, EST AFRICAN AMERICAN: 11 mL/min — AB (ref >60)
GFR, EST NON AFRICAN AMERICAN: 10 mL/min — AB (ref >60)
Glucose, Bld: 139 mg/dL — ABNORMAL HIGH (ref 65–99)
POTASSIUM: 3.4 mmol/L — AB (ref 3.5–5.1)
Sodium: 137 mmol/L (ref 135–145)
TOTAL PROTEIN: 7.7 g/dL (ref 6.5–8.1)

## 2016-03-10 LAB — GLUCOSE, CAPILLARY
GLUCOSE-CAPILLARY: 93 mg/dL (ref 65–99)
GLUCOSE-CAPILLARY: 194 mg/dL — AB (ref 65–99)
Glucose-Capillary: 85 mg/dL (ref 65–99)

## 2016-03-10 LAB — PROTIME-INR
INR: 1.13
Prothrombin Time: 14.6 seconds (ref 11.4–15.2)

## 2016-03-10 LAB — TROPONIN I
TROPONIN I: 0.59 ng/mL — AB (ref <0.03)
Troponin I: 0.72 ng/mL (ref <0.03)
Troponin I: 0.68 ng/mL (ref <0.03)

## 2016-03-10 LAB — APTT: APTT: 39 s — AB (ref 24–36)

## 2016-03-10 LAB — TSH: TSH: 0.722 u[IU]/mL (ref 0.350–4.500)

## 2016-03-10 LAB — ECHOCARDIOGRAM COMPLETE
Height: 67 in
WEIGHTICAEL: 2035.2 [oz_av]

## 2016-03-10 LAB — LIPID PANEL
CHOLESTEROL: 186 mg/dL (ref 0–200)
HDL: 53 mg/dL (ref >40)
LDL Cholesterol: 105 mg/dL — ABNORMAL HIGH (ref 0–99)
Total CHOL/HDL Ratio: 3.5 RATIO
Triglycerides: 140 mg/dL (ref <150)
VLDL: 28 mg/dL (ref 0–40)

## 2016-03-10 LAB — CBC
HEMATOCRIT: 36.9 % — AB (ref 40.0–52.0)
Hemoglobin: 11.6 g/dL — ABNORMAL LOW (ref 13.0–18.0)
MCH: 23.3 pg — AB (ref 26.0–34.0)
MCHC: 31.4 g/dL — ABNORMAL LOW (ref 32.0–36.0)
MCV: 74.3 fL — AB (ref 80.0–100.0)
Platelets: 182 10*3/uL (ref 150–440)
RBC: 4.97 MIL/uL (ref 4.40–5.90)
RDW: 15.8 % — AB (ref 11.5–14.5)
WBC: 5.4 10*3/uL (ref 3.8–10.6)

## 2016-03-10 LAB — MRSA PCR SCREENING: MRSA BY PCR: NEGATIVE

## 2016-03-10 MED ORDER — HYDRALAZINE HCL 25 MG PO TABS
25.0000 mg | ORAL_TABLET | Freq: Three times a day (TID) | ORAL | Status: DC
  Administered 2016-03-10 – 2016-03-11 (×4): 25 mg via ORAL
  Filled 2016-03-10 (×4): qty 1

## 2016-03-10 MED ORDER — HEPARIN BOLUS VIA INFUSION
3400.0000 [IU] | Freq: Once | INTRAVENOUS | Status: AC
  Administered 2016-03-10: 3400 [IU] via INTRAVENOUS
  Filled 2016-03-10: qty 3400

## 2016-03-10 MED ORDER — POTASSIUM CHLORIDE CRYS ER 20 MEQ PO TBCR
40.0000 meq | EXTENDED_RELEASE_TABLET | Freq: Once | ORAL | Status: AC
  Administered 2016-03-10: 40 meq via ORAL
  Filled 2016-03-10: qty 2

## 2016-03-10 MED ORDER — HEPARIN (PORCINE) IN NACL 100-0.45 UNIT/ML-% IJ SOLN
700.0000 [IU]/h | INTRAMUSCULAR | Status: DC
  Administered 2016-03-10: 700 [IU]/h via INTRAVENOUS
  Filled 2016-03-10: qty 250

## 2016-03-10 NOTE — Progress Notes (Signed)
[place] at [place] NAME: [name]    MR#:  [id number]  DATE OF BIRTH:  [birth year]  SUBJECTIVE:admitted  For SOB/hypoxia with o2 sats 80% on RA when he came,  CHIEF COMPLAINT:   Chief Complaint  Patient presents with  . Cough  feels better.o sob or chest pain.o2 sats 99%on 2 litre's.receiving extra hd today,  REVIEW OF SYSTEMS:   ROS CONSTITUTIONAL: No fever, fatigue or weakness.  EYES: No blurred or double vision.  EARS, NOSE, AND THROAT: No tinnitus or ear pain.  RESPIRATORY: No cough, shortness of breath, wheezing or hemoptysis.  CARDIOVASCULAR: No chest pain, orthopnea, edema.  GASTROINTESTINAL: No nausea, vomiting, diarrhea or abdominal pain.  GENITOURINARY: No dysuria, hematuria.  ENDOCRINE: No polyuria, nocturia,  HEMATOLOGY: No anemia, easy bruising or bleeding SKIN: No rash or lesion. MUSCULOSKELETAL: No joint pain or arthritis.   NEUROLOGIC: No tingling, numbness, weakness.  PSYCHIATRY: No anxiety or depression.   DRUG ALLERGIES:  No Known Allergies  VITALS:  Blood pressure (!) 142/57, pulse 79, temperature 97.7 F (36.5 C), temperature source Oral, resp. rate 18, height 5\' 7"  (1.702 m), weight 57.7 kg (127 lb 3.2 oz), SpO2 98 %.  PHYSICAL EXAMINATION:  GENERAL:  81 y.o.-year-old patient lying in the bed with no acute distress.  EYES: Pupils equal, round, reactive to light and accommodation. He is legally blind.No scleral icterus. Extraocular muscles intact.  HEENT: Head atraumatic, normocephalic. Oropharynx and nasopharynx clear.  NECK:  Supple, no jugular venous distention. No thyroid enlargement, no tenderness.  LUNGS: Normal breath sounds bilaterally, no wheezing, rales,rhonchi or crepitation. No use of accessory muscles of respiration.  CARDIOVASCULAR: S1, S2 normal. No murmurs, rubs, or gallops.  ABDOMEN: Soft, nontender, nondistended. Bowel sounds present. No organomegaly or mass.  EXTREMITIES: No  pedal edema, cyanosis, or clubbing.  NEUROLOGIC: Cranial nerves II through XII are intact. Muscle strength 5/5 in all extremities. Sensation intact. Gait not checked.  PSYCHIATRIC: The patient is alert and oriented x 3.  SKIN: No obvious rash, lesion, or ulcer.    LABORATORY PANEL:   CBC  Recent Labs Lab 03/10/16 0409  WBC 5.4  HGB 11.6*  HCT 36.9*  PLT 182   ------------------------------------------------------------------------------------------------------------------  Chemistries   Recent Labs Lab 03/10/16 0409  NA 137  K 3.4*  CL 98*  CO2 29  GLUCOSE 139*  BUN 26*  CREATININE 5.06*  CALCIUM 9.0  AST 35  ALT 28  ALKPHOS 55  BILITOT 0.8   ------------------------------------------------------------------------------------------------------------------  Cardiac Enzymes  Recent Labs Lab 03/10/16 1015  TROPONINI 0.59*   ------------------------------------------------------------------------------------------------------------------  RADIOLOGY:  Dg Chest 2 View  Result Date: 03/09/2016 CLINICAL DATA:  Patient states he was sent to ED from dialysis for increased SOB. Patient has a HX of chronic kidney disease, end stage renal failure, HTN, and diabetes. EXAM: CHEST  2 VIEW COMPARISON:  11/13/2014 FINDINGS: The cardiac silhouette is borderline enlarged. No mediastinal or hilar masses. No evidence of adenopathy. There are prominent bronchovascular markings and mild interstitial thickening most evident in the lung bases. No definite pleural effusion. No pneumothorax. Skeletal structures are demineralized but grossly intact. IMPRESSION: 1. Mild interstitial thickening and prominent bronchovascular markings. Although this may all be chronic, mild interstitial edema is suspected. Electronically Signed   By: Lajean Manes M.D.   On: 03/09/2016 17:51   Ct Angio Chest Pe W And/or Wo Contrast  Result Date: 03/09/2016 CLINICAL DATA:  Hypoxia after dialysis.  Dyspnea and  cough. EXAM:  CT ANGIOGRAPHY CHEST WITH CONTRAST TECHNIQUE: Multidetector CT imaging of the chest was performed using the standard protocol during bolus administration of intravenous contrast. Multiplanar CT image reconstructions and MIPs were obtained to evaluate the vascular anatomy. CONTRAST:  75 cc of Isovue 370 IV COMPARISON:  CXR 03/09/2016, chest CT 11/10/2014 acute and new FINDINGS: Cardiovascular: No acute pulmonary embolus. There is aortic atherosclerosis without aneurysm nor dissection. Bovine arch anatomy with common trunk for the brachiocephalic and left common carotid arteries. Calcified left subclavian artery origin. Heart is top-normal in size without pericardial effusion. There is coronary arteriosclerosis. Mediastinum/Nodes: Calcified mediastinal and right hilar lymph nodes are again noted consistent with old granulomatous disease. No significant change noted. The trachea, mainstem bronchi and esophagus are stable without acute appearing abnormalities. No thyromegaly. Lungs/Pleura: Left greater than right bilateral pleural effusions with adjacent compressive atelectasis are noted small on the right and small to moderate on the left. Subsegmental atelectasis noted in both bone lower lobes. Patchy airspace opacities in the upper lobes, lingula, right middle lobe and both lower lobes may reflect changes from CHF. Respiratory motion artifact slightly limit assessment. Upper Abdomen: Left kidney is not visualized and may be atrophic or excluded on this study. Gallbladder is physiologically distended and appears to contain a few dependent calculi, series 4, image 94. No biliary dilatation. Hypodensity along the falciform ligament is likely related to partial volume averaging of the leaves of the falciform ligament. Musculoskeletal: The dorsal spine appears diffusely dense consistent with renal osteodystrophy. Multilevel degenerative disc disease along the mid to lower thoracic spine. No acute osseous  abnormality. Review of the MIP images confirms the above findings. IMPRESSION: 1. No acute pulmonary embolus. 2. Small right and small to moderate left pleural effusions with adjacent atelectasis. Mild bilateral patchy alveolar airspace opacities in both upper lobes, lower lobes, right middle lobe and lingula may reflect changes from pulmonary edema. 3. Calcified mediastinal and hilar lymph nodes consistent with old granulomatous disease unchanged in appearance. 4. Tiny dependent calculi noted of the gallbladder without secondary signs of acute cholecystitis. 5. Renal osteodystrophy is believed to account for the dense appearance of the vertebral bodies of the dorsal spine in this patient on dialysis. 6. Bovine arch anatomy.  Aortic atherosclerosis. 7. Coronary arteriosclerosis. Electronically Signed   By: Ashley Royalty M.D.   On: 03/09/2016 19:08    EKG:   Orders placed or performed during the hospital encounter of 03/09/16  . ED EKG  . ED EKG  . EKG 12-Lead  . EKG 12-Lead  . ED EKG  . ED EKG  . EKG 12-Lead  . EKG 12-Lead  . EKG 12-Lead    ASSESSMENT AND PLAN:   1.This is a 81 y.o. male with a history of Diabetes, end-stage renal disease on hemodialysis, hypertension  now being admitted with: 1. New-onset congestive heart failure,leen by cardio. Follow echo results.   2. Elevated troponin, may be demand ischemia secondary to new onset of congestive heart failure ;seen by cardio;check echo,no further cardia intervention is planned. 3. Hypokalemia-we'll give 20 mEq by mouth. 4. History of hypertension-continue Norvasc, Catapres, lisinopril, Lopressor 5. History of end-stage renal disease on hemodialysis-continue Renvela,nephro is following.extran HD today. 7. History of diabetes-insulin siding scale coverage 7. History of peripheral neuropathy-continue Neurontin 8.blindness in both eyes.    All the records are reviewed and case discussed with Care Management/Social Workerr. Management  plans discussed with the patient, family and they are in agreement.  CODE STATUS:full  TOTAL TIME TAKING  CARE OF THIS PATIENT:35 minutes.   POSSIBLE D/C IN1-2 DAYS, DEPENDING ON CLINICAL CONDITION.   Epifanio Lesches M.D on 03/10/2016 at 10:20 PM  Between 7am to 6pm - Pager - [phone]  After 6pm go to [email or web address] - password [email or web address] [place] [facility]  Office  [phone]  CC: Primary care physician; Rinaldo Cloud, MD   Note: This dictation was prepared with Dragon dictation along with smaller phrase technology. Any transcriptional errors that result from this process are unintentional.

## 2016-03-10 NOTE — Progress Notes (Signed)
Post hd assessment 

## 2016-03-10 NOTE — Progress Notes (Signed)
Post hd vitals 

## 2016-03-10 NOTE — Progress Notes (Signed)
  End of hd 

## 2016-03-10 NOTE — Progress Notes (Signed)
Subjective:   Patient went to dialysis yesterday and completed his treatment After treatment he was still 79%  on room air therefore sent to hospital for evaluation He does have a non productive cough Seen on dialysis at present. BP is high   HEMODIALYSIS FLOWSHEET:  Blood Flow Rate (mL/min): 400 mL/min Arterial Pressure (mmHg): -180 mmHg Venous Pressure (mmHg): 190 mmHg Transmembrane Pressure (mmHg): 30 mmHg Ultrafiltration Rate (mL/min): 1000 mL/min Dialysate Flow Rate (mL/min): 600 ml/min Conductivity: Machine : 12.7 Conductivity: Machine : 12.7 Dialysis Fluid Bolus: Normal Saline Bolus Amount (mL): 250 mL (prime) Dialysate Change: 4K Intra-Hemodialysis Comments: 965. pt alert, no c/o, vss.     Objective:  Vital signs in last 24 hours:  Temp:  [97.7 F (36.5 C)-98.9 F (37.2 C)] 98.9 F (37.2 C) (12/12 1005) Pulse Rate:  [71-84] 71 (12/12 1045) Resp:  [15-28] 16 (12/12 1045) BP: (143-187)/(62-119) 143/67 (12/12 1045) SpO2:  [91 %-100 %] 100 % (12/12 1045) Weight:  [57.3 kg (126 lb 6.4 oz)-58.4 kg (128 lb 12 oz)] 58.4 kg (128 lb 12 oz) (12/12 1005)  Weight change:  Filed Weights   03/09/16 2240 03/10/16 0407 03/10/16 1005  Weight: 57.7 kg (127 lb 1.6 oz) 57.3 kg (126 lb 6.4 oz) 58.4 kg (128 lb 12 oz)    Intake/Output:    Intake/Output Summary (Last 24 hours) at 03/10/16 1116 Last data filed at 03/10/16 1010  Gross per 24 hour  Intake              126 ml  Output                0 ml  Net              126 ml     Physical Exam: General: NAD, laying in bed  HEENT Left corneal opacity  Neck supple  Pulm/lungs Coarse b/l  CVS/Heart Regular sinus rhythm  Abdomen:  Soft, NT  Extremities: Left AKA, rt no edema  Neurologic: Alert, oriented  Skin: No rashes  Access: Rt arm AVF       Basic Metabolic Panel:   Recent Labs Lab 03/09/16 1716 03/10/16 0409  NA 140 137  K 3.0* 3.4*  CL 97* 98*  CO2 31 29  GLUCOSE 97 139*  BUN 16 26*  CREATININE 4.19*  5.06*  CALCIUM 9.1 9.0     CBC:  Recent Labs Lab 03/09/16 1716 03/10/16 0409  WBC 6.2 5.4  HGB 11.2* 11.6*  HCT 34.6* 36.9*  MCV 72.2* 74.3*  PLT 194 182      Microbiology:  Recent Results (from the past 720 hour(s))  MRSA PCR Screening     Status: None   Collection Time: 03/09/16 11:06 PM  Result Value Ref Range Status   MRSA by PCR NEGATIVE NEGATIVE Final    Comment:        The GeneXpert MRSA Assay (FDA approved for NASAL specimens only), is one component of a comprehensive MRSA colonization surveillance program. It is not intended to diagnose MRSA infection nor to guide or monitor treatment for MRSA infections.     Coagulation Studies:  Recent Labs  03/10/16 0409  LABPROT 14.6  INR 1.13    Urinalysis: No results for input(s): COLORURINE, LABSPEC, PHURINE, GLUCOSEU, HGBUR, BILIRUBINUR, KETONESUR, PROTEINUR, UROBILINOGEN, NITRITE, LEUKOCYTESUR in the last 72 hours.  Invalid input(s): APPERANCEUR    Imaging: Dg Chest 2 View  Result Date: 03/09/2016 CLINICAL DATA:  Patient states he was sent to ED from dialysis for  increased SOB. Patient has a HX of chronic kidney disease, end stage renal failure, HTN, and diabetes. EXAM: CHEST  2 VIEW COMPARISON:  11/13/2014 FINDINGS: The cardiac silhouette is borderline enlarged. No mediastinal or hilar masses. No evidence of adenopathy. There are prominent bronchovascular markings and mild interstitial thickening most evident in the lung bases. No definite pleural effusion. No pneumothorax. Skeletal structures are demineralized but grossly intact. IMPRESSION: 1. Mild interstitial thickening and prominent bronchovascular markings. Although this may all be chronic, mild interstitial edema is suspected. Electronically Signed   By: Lajean Manes M.D.   On: 03/09/2016 17:51   Ct Angio Chest Pe W And/or Wo Contrast  Result Date: 03/09/2016 CLINICAL DATA:  Hypoxia after dialysis.  Dyspnea and cough. EXAM: CT ANGIOGRAPHY  CHEST WITH CONTRAST TECHNIQUE: Multidetector CT imaging of the chest was performed using the standard protocol during bolus administration of intravenous contrast. Multiplanar CT image reconstructions and MIPs were obtained to evaluate the vascular anatomy. CONTRAST:  75 cc of Isovue 370 IV COMPARISON:  CXR 03/09/2016, chest CT 11/10/2014 acute and new FINDINGS: Cardiovascular: No acute pulmonary embolus. There is aortic atherosclerosis without aneurysm nor dissection. Bovine arch anatomy with common trunk for the brachiocephalic and left common carotid arteries. Calcified left subclavian artery origin. Heart is top-normal in size without pericardial effusion. There is coronary arteriosclerosis. Mediastinum/Nodes: Calcified mediastinal and right hilar lymph nodes are again noted consistent with old granulomatous disease. No significant change noted. The trachea, mainstem bronchi and esophagus are stable without acute appearing abnormalities. No thyromegaly. Lungs/Pleura: Left greater than right bilateral pleural effusions with adjacent compressive atelectasis are noted small on the right and small to moderate on the left. Subsegmental atelectasis noted in both bone lower lobes. Patchy airspace opacities in the upper lobes, lingula, right middle lobe and both lower lobes may reflect changes from CHF. Respiratory motion artifact slightly limit assessment. Upper Abdomen: Left kidney is not visualized and may be atrophic or excluded on this study. Gallbladder is physiologically distended and appears to contain a few dependent calculi, series 4, image 94. No biliary dilatation. Hypodensity along the falciform ligament is likely related to partial volume averaging of the leaves of the falciform ligament. Musculoskeletal: The dorsal spine appears diffusely dense consistent with renal osteodystrophy. Multilevel degenerative disc disease along the mid to lower thoracic spine. No acute osseous abnormality. Review of the MIP  images confirms the above findings. IMPRESSION: 1. No acute pulmonary embolus. 2. Small right and small to moderate left pleural effusions with adjacent atelectasis. Mild bilateral patchy alveolar airspace opacities in both upper lobes, lower lobes, right middle lobe and lingula may reflect changes from pulmonary edema. 3. Calcified mediastinal and hilar lymph nodes consistent with old granulomatous disease unchanged in appearance. 4. Tiny dependent calculi noted of the gallbladder without secondary signs of acute cholecystitis. 5. Renal osteodystrophy is believed to account for the dense appearance of the vertebral bodies of the dorsal spine in this patient on dialysis. 6. Bovine arch anatomy.  Aortic atherosclerosis. 7. Coronary arteriosclerosis. Electronically Signed   By: Ashley Royalty M.D.   On: 03/09/2016 19:08     Medications:   . heparin 700 Units/hr (03/10/16 0554)   . amLODipine  5 mg Oral Daily  . aspirin EC  81 mg Oral Daily  . gabapentin  300 mg Oral TID  . hydrALAZINE  25 mg Oral Q8H  . insulin aspart  0-5 Units Subcutaneous QHS  . insulin aspart  0-9 Units Subcutaneous TID WC  . insulin  glargine  5 Units Subcutaneous Daily  . lisinopril  40 mg Oral Daily  . metoprolol  50 mg Oral Daily  . multivitamin  1 tablet Oral Daily  . sevelamer carbonate  1,600 mg Oral TID WC  . sodium chloride flush  3 mL Intravenous Q12H  . sodium chloride flush  3 mL Intravenous Q12H   sodium chloride, acetaminophen, albuterol, bisacodyl, cloNIDine, magnesium citrate, ondansetron **OR** ondansetron (ZOFRAN) IV, oxyCODONE, senna-docusate, sodium chloride flush  Assessment/ Plan:  81 y.o. African-American male with end-stage renal disease, diabetes, hypertension, peripheral vascular disease, left above-the-knee amputation admitted for shortness of breath  [facility]//[address]//MWF  1. ESRD. - Extra hemodialysis treatment today to optimize volume status and address shortness of breath -  Ultrafiltration goal lowered from 3 to 2 kg as tolerated due to cramping - Reevaluate for need of HD tomorrow  2. Hypertension Blood pressure is higher today Administer antihypertensives preferably at night  3. Secondary hyperparathyroidism Sevelamer Monitor phosphorus during this admission   LOS: 1 Charlott Calvario 12/12/201711:16 AM

## 2016-03-10 NOTE — Progress Notes (Signed)
PT Cancellation Note  Patient Details Name: [name] MRN: [id number] DOB: 05/23/1934   Cancelled Treatment:    Reason Eval/Treat Not Completed: Other (comment). PT entered room, patient had just started eating lunch. PT offered to transfer to chair, patient reports he is "pretty well situated right here". Patient lives "alone" with aides coming throughout the day to assist with transfers, cooking, cleaning. [facility] the [facility]. He does not ambulate and has not for some time, transfers only. 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 He was admitted for shortness of breath, found to have pleural effusions (was in the low 80s on RA), now on 2L of O2. PT will re-attempt as patient is able and available.   Kerman Passey, PT, DPT    03/10/2016, 3:02 PM

## 2016-03-10 NOTE — Progress Notes (Signed)
PT Cancellation Note  Patient Details Name: [name] MRN: [id number] DOB: 1934-08-30   Cancelled Treatment:    Reason Eval/Treat Not Completed: Patient at procedure or test/unavailable (at HD).  Will attempt to see pt again later today, schedule permitting.   Collie Siad PT, DPT 03/10/2016, 10:41 AM

## 2016-03-10 NOTE — Progress Notes (Signed)
[name] is a 81 y.o. male  [id number]  Primary Cardiologist: Neoma Laming Reason for Consultation: CHF  HPI: This is a 59 time year-old African-American male with past medical history of end-stage renal disease on dialysis presented to the hospital with shortness of breath orthopnea PND and leg swelling. He had some atypical chest pain and cough also.   Review of Systems: Patient denies any chest pain at this time.   Past Medical History:  Diagnosis Date  . Anemia of chronic disease   . 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 Blind   . Blindness of both eyes   . Blood in stool   . Chronic kidney disease   . CKD (chronic kidney disease)   . Diabetes mellitus without complication ([place])   . Edentulism   . ESRD (end stage renal disease) ([place])   . ESRD (end stage renal disease) ([place])   . Fecal incontinence   . Glaucoma   . History of fall   . Hypertension   . Iritis   . MGUS (monoclonal gammopathy of unknown significance)   . Nocturnal cough   . Peripheral vascular disease ([place])   . Pneumonia   . Protein calorie malnutrition ([place])   . PVD (peripheral vascular disease) ([place])   . Renal cyst     Medications Prior to Admission  Medication Sig Dispense Refill  . acetaminophen (TYLENOL) 500 MG tablet Take 500 mg by mouth every 6 (six) hours as needed.    Marland Kitchen amLODipine (NORVASC) 5 MG tablet Take 5 mg by mouth daily.    Marland Kitchen aspirin EC 81 MG tablet Take 81 mg by mouth daily.    . cloNIDine (CATAPRES) 0.1 MG tablet Take 0.1 mg by mouth daily as needed. Give 0.1mg  po prn diastolic [id number] /or systolic b/p [id number]    . gabapentin (NEURONTIN) 300 MG capsule Take 300 mg by mouth 3 (three) times daily.    . insulin glargine (LANTUS) 100 UNIT/ML injection Inject 5 Units into the skin daily.     . insulin NPH-regular Human (NOVOLIN 70/30) (70-30) 100 UNIT/ML injection Inject 0-12 Units into the skin daily.    Marland Kitchen lisinopril (PRINIVIL,ZESTRIL) 40 MG tablet Take 40 mg by  mouth daily.     . metoprolol (LOPRESSOR) 50 MG tablet Take 50 mg by mouth daily.     . multivitamin (RENA-VIT) TABS tablet Take 1 tablet by mouth daily.    . sevelamer (RENAGEL) 800 MG tablet Take 1,600 mg by mouth 3 (three) times daily with meals.       Marland Kitchen amLODipine  5 mg Oral Daily  . aspirin EC  81 mg Oral Daily  . gabapentin  300 mg Oral TID  . insulin aspart  0-5 Units Subcutaneous QHS  . insulin aspart  0-9 Units Subcutaneous TID WC  . insulin glargine  5 Units Subcutaneous Daily  . lisinopril  40 mg Oral Daily  . metoprolol  50 mg Oral Daily  . multivitamin  1 tablet Oral Daily  . sevelamer carbonate  1,600 mg Oral TID WC  . sodium chloride flush  3 mL Intravenous Q12H  . sodium chloride flush  3 mL Intravenous Q12H    Infusions: . heparin 700 Units/hr (03/10/16 0554)    No Known Allergies  Social History   Social History  . Marital status: Married    Spouse name: N/A  . Number of children: N/A  . Years of education: N/A   Occupational  History  . Not on file.   Social History Main Topics  . Smoking status: Former Smoker    Quit date: 03/09/1990  . Smokeless tobacco: Never Used     Comment: Doesn't recall details  . Alcohol use No  . Drug use: No  . Sexual activity: Not on file   Other Topics Concern  . Not on file   Social History Narrative  . No narrative on file    Family History  Problem Relation Age of Onset  . Diabetes Mother   . Diabetes Father   . Diabetes Sister   . Diabetes Brother     PHYSICAL EXAM: Vitals:   03/09/16 2240 03/10/16 0350  BP: (!) 180/73 (!) 187/68  Pulse: 75 72  Resp: 18 18  Temp: 98 F (36.7 C) 98.2 F (36.8 C)     Intake/Output Summary (Last 24 hours) at 03/10/16 0810 Last data filed at 03/10/16 0636  Gross per 24 hour  Intake                6 ml  Output                0 ml  Net                6 ml    General:  Well appearing. No respiratory difficulty HEENT: normal Neck: supple. no JVD. Carotids  2+ bilat; no bruits. No lymphadenopathy or thryomegaly appreciated. Cor: PMI nondisplaced. Regular rate & rhythm. No rubs, gallops or murmurs. Lungs: clear Abdomen: soft, nontender, nondistended. No hepatosplenomegaly. No bruits or masses. Good bowel sounds. Extremities: no cyanosis, clubbing, rash, edema Neuro: alert & oriented x 3, cranial nerves grossly intact. moves all 4 extremities w/o difficulty. Affect pleasant.  ECG: Normal sinus rhythm and LVH with left atrial enlargement nonspecific ST-T changes  Results for orders placed or performed during the hospital encounter of 03/09/16 (from the past 24 hour(s))  Blood gas, arterial ([facility] & [facility] ONLY)     Status: Abnormal   Collection Time: 03/09/16  5:03 PM  Result Value Ref Range   FIO2 0.28    Delivery systems NASAL CANNULA    pH, Arterial 7.47 (H) 7.350 - 7.450   pCO2 arterial 46 32.0 - 48.0 mmHg   pO2, Arterial 50 (L) 83.0 - 108.0 mmHg   Bicarbonate 33.5 (H) 20.0 - 28.0 mmol/L   Acid-Base Excess 8.7 (H) 0.0 - 2.0 mmol/L   O2 Saturation 87.5 %   Patient temperature 37.0    Collection site LEFT BRACHIAL    Sample type ARTERIAL DRAW    Allens test (pass/fail) POSITIVE (A) PASS  CBC     Status: Abnormal   Collection Time: 03/09/16  5:16 PM  Result Value Ref Range   WBC 6.2 3.8 - 10.6 K/uL   RBC 4.80 4.40 - 5.90 MIL/uL   Hemoglobin 11.2 (L) 13.0 - 18.0 g/dL   HCT 34.6 (L) 40.0 - 52.0 %   MCV 72.2 (L) 80.0 - 100.0 fL   MCH 23.4 (L) 26.0 - 34.0 pg   MCHC 32.4 32.0 - 36.0 g/dL   RDW 15.6 (H) 11.5 - 14.5 %   Platelets 194 150 - 440 K/uL  Basic metabolic panel     Status: Abnormal   Collection Time: 03/09/16  5:16 PM  Result Value Ref Range   Sodium 140 135 - 145 mmol/L   Potassium 3.0 (L) 3.5 - 5.1 mmol/L   Chloride 97 (L) 101 - 111 mmol/L  CO2 31 22 - 32 mmol/L   Glucose, Bld 97 65 - 99 mg/dL   BUN 16 6 - 20 mg/dL   Creatinine, Ser 4.19 (H) 0.61 - 1.24 mg/dL   Calcium 9.1 8.9 - 10.3 mg/dL   GFR calc non Af Amer 12 (L) >60  mL/min   GFR calc Af Amer 14 (L) >60 mL/min   Anion gap 12 5 - 15  Troponin I     Status: Abnormal   Collection Time: 03/09/16  5:16 PM  Result Value Ref Range   Troponin I 0.67 (HH) <0.03 ng/mL  Brain natriuretic peptide     Status: Abnormal   Collection Time: 03/09/16  5:16 PM  Result Value Ref Range   B Natriuretic Peptide 2,986.0 (H) 0.0 - 100.0 pg/mL  Influenza panel by PCR (type A & B, H1N1)     Status: None   Collection Time: 03/09/16  5:49 PM  Result Value Ref Range   Influenza A By PCR NEGATIVE NEGATIVE   Influenza B By PCR NEGATIVE NEGATIVE  Glucose, capillary     Status: Abnormal   Collection Time: 03/09/16 10:54 PM  Result Value Ref Range   Glucose-Capillary 153 (H) 65 - 99 mg/dL   Comment 1 Notify RN    Comment 2 Document in Chart   MRSA PCR Screening     Status: None   Collection Time: 03/09/16 11:06 PM  Result Value Ref Range   MRSA by PCR NEGATIVE NEGATIVE  TSH     Status: None   Collection Time: 03/09/16 11:23 PM  Result Value Ref Range   TSH 0.722 0.350 - 4.500 uIU/mL  Troponin I     Status: Abnormal   Collection Time: 03/09/16 11:23 PM  Result Value Ref Range   Troponin I 0.72 (HH) <0.03 ng/mL  Blood gas, arterial     Status: Abnormal   Collection Time: 03/09/16 11:30 PM  Result Value Ref Range   FIO2 0.32    Delivery systems NASAL CANNULA    pH, Arterial 7.44 7.350 - 7.450   pCO2 arterial 46 32.0 - 48.0 mmHg   pO2, Arterial 75 (L) 83.0 - 108.0 mmHg   Bicarbonate 31.2 (H) 20.0 - 28.0 mmol/L   Acid-Base Excess 6.2 (H) 0.0 - 2.0 mmol/L   O2 Saturation 95.4 %   Patient temperature 37.0    Collection site LEFT RADIAL    Sample type ARTERIAL DRAW    Allens test (pass/fail) PASS PASS  CBC     Status: Abnormal   Collection Time: 03/10/16  4:09 AM  Result Value Ref Range   WBC 5.4 3.8 - 10.6 K/uL   RBC 4.97 4.40 - 5.90 MIL/uL   Hemoglobin 11.6 (L) 13.0 - 18.0 g/dL   HCT 36.9 (L) 40.0 - 52.0 %   MCV 74.3 (L) 80.0 - 100.0 fL   MCH 23.3 (L) 26.0 - 34.0  pg   MCHC 31.4 (L) 32.0 - 36.0 g/dL   RDW 15.8 (H) 11.5 - 14.5 %   Platelets 182 150 - 440 K/uL  Comprehensive metabolic panel     Status: Abnormal   Collection Time: 03/10/16  4:09 AM  Result Value Ref Range   Sodium 137 135 - 145 mmol/L   Potassium 3.4 (L) 3.5 - 5.1 mmol/L   Chloride 98 (L) 101 - 111 mmol/L   CO2 29 22 - 32 mmol/L   Glucose, Bld 139 (H) 65 - 99 mg/dL   BUN 26 (H) 6 - 20 mg/dL  Creatinine, Ser 5.06 (H) 0.61 - 1.24 mg/dL   Calcium 9.0 8.9 - 10.3 mg/dL   Total Protein 7.7 6.5 - 8.1 g/dL   Albumin 3.7 3.5 - 5.0 g/dL   AST 35 15 - 41 U/L   ALT 28 17 - 63 U/L   Alkaline Phosphatase 55 38 - 126 U/L   Total Bilirubin 0.8 0.3 - 1.2 mg/dL   GFR calc non Af Amer 10 (L) >60 mL/min   GFR calc Af Amer 11 (L) >60 mL/min   Anion gap 10 5 - 15  Lipid panel     Status: Abnormal   Collection Time: 03/10/16  4:09 AM  Result Value Ref Range   Cholesterol 186 0 - 200 mg/dL   Triglycerides 140 <150 mg/dL   HDL 53 >40 mg/dL   Total CHOL/HDL Ratio 3.5 RATIO   VLDL 28 0 - 40 mg/dL   LDL Cholesterol 105 (H) 0 - 99 mg/dL  Troponin I     Status: Abnormal   Collection Time: 03/10/16  4:09 AM  Result Value Ref Range   Troponin I 0.68 (HH) <0.03 ng/mL  APTT     Status: Abnormal   Collection Time: 03/10/16  4:09 AM  Result Value Ref Range   aPTT 39 (H) 24 - 36 seconds  Protime-INR     Status: None   Collection Time: 03/10/16  4:09 AM  Result Value Ref Range   Prothrombin Time 14.6 11.4 - 15.2 seconds   INR 1.13    Dg Chest 2 View  Result Date: 03/09/2016 CLINICAL DATA:  Patient states he was sent to ED from dialysis for increased SOB. Patient has a HX of chronic kidney disease, end stage renal failure, HTN, and diabetes. EXAM: CHEST  2 VIEW COMPARISON:  11/13/2014 FINDINGS: The cardiac silhouette is borderline enlarged. No mediastinal or hilar masses. No evidence of adenopathy. There are prominent bronchovascular markings and mild interstitial thickening most evident in the lung  bases. No definite pleural effusion. No pneumothorax. Skeletal structures are demineralized but grossly intact. IMPRESSION: 1. Mild interstitial thickening and prominent bronchovascular markings. Although this may all be chronic, mild interstitial edema is suspected. Electronically Signed   By: Lajean Manes M.D.   On: 03/09/2016 17:51   Ct Angio Chest Pe W And/or Wo Contrast  Result Date: 03/09/2016 CLINICAL DATA:  Hypoxia after dialysis.  Dyspnea and cough. EXAM: CT ANGIOGRAPHY CHEST WITH CONTRAST TECHNIQUE: Multidetector CT imaging of the chest was performed using the standard protocol during bolus administration of intravenous contrast. Multiplanar CT image reconstructions and MIPs were obtained to evaluate the vascular anatomy. CONTRAST:  75 cc of Isovue 370 IV COMPARISON:  CXR 03/09/2016, chest CT 11/10/2014 acute and new FINDINGS: Cardiovascular: No acute pulmonary embolus. There is aortic atherosclerosis without aneurysm nor dissection. Bovine arch anatomy with common trunk for the brachiocephalic and left common carotid arteries. Calcified left subclavian artery origin. Heart is top-normal in size without pericardial effusion. There is coronary arteriosclerosis. Mediastinum/Nodes: Calcified mediastinal and right hilar lymph nodes are again noted consistent with old granulomatous disease. No significant change noted. The trachea, mainstem bronchi and esophagus are stable without acute appearing abnormalities. No thyromegaly. Lungs/Pleura: Left greater than right bilateral pleural effusions with adjacent compressive atelectasis are noted small on the right and small to moderate on the left. Subsegmental atelectasis noted in both bone lower lobes. Patchy airspace opacities in the upper lobes, lingula, right middle lobe and both lower lobes may reflect changes from CHF. Respiratory motion artifact  slightly limit assessment. Upper Abdomen: Left kidney is not visualized and may be atrophic or excluded on  this study. Gallbladder is physiologically distended and appears to contain a few dependent calculi, series 4, image 94. No biliary dilatation. Hypodensity along the falciform ligament is likely related to partial volume averaging of the leaves of the falciform ligament. Musculoskeletal: The dorsal spine appears diffusely dense consistent with renal osteodystrophy. Multilevel degenerative disc disease along the mid to lower thoracic spine. No acute osseous abnormality. Review of the MIP images confirms the above findings. IMPRESSION: 1. No acute pulmonary embolus. 2. Small right and small to moderate left pleural effusions with adjacent atelectasis. Mild bilateral patchy alveolar airspace opacities in both upper lobes, lower lobes, right middle lobe and lingula may reflect changes from pulmonary edema. 3. Calcified mediastinal and hilar lymph nodes consistent with old granulomatous disease unchanged in appearance. 4. Tiny dependent calculi noted of the gallbladder without secondary signs of acute cholecystitis. 5. Renal osteodystrophy is believed to account for the dense appearance of the vertebral bodies of the dorsal spine in this patient on dialysis. 6. Bovine arch anatomy.  Aortic atherosclerosis. 7. Coronary arteriosclerosis. Electronically Signed   By: Ashley Royalty M.D.   On: 03/09/2016 19:08     ASSESSMENT AND PLAN: Congestive heart failure and end-stage renal disease with mildly elevated troponin due to above findings. Elevated troponin due to congestive heart failure). Patient is not having chest pain with no acute EKG changes. We will maximize medical therapy and get an echocardiogram to evaluate left ventricular ejection fraction.  Wilbert Hayashi A

## 2016-03-10 NOTE — Care Management (Addendum)
Patient admitted with new onset of CHF.  He is admitted from home and is followed by [facility]. Spoke with [name].  Patient receives chronic dialysis at [facility]  [facility] provides transportation to dialysis.  Agency can also provide home health nurse and already provides aide services. He lives alone and is legally blind and AKA on left.  Notified [name] with [facility] of admission. need need for 02 is acute.

## 2016-03-10 NOTE — Progress Notes (Signed)
Pre hd assessment  

## 2016-03-10 NOTE — Progress Notes (Signed)
Start of hd 

## 2016-03-10 NOTE — Progress Notes (Signed)
ANTICOAGULATION CONSULT NOTE - Initial Consult  Pharmacy Consult for heparin drip Indication: ACS/STEMI  No Known Allergies  Patient Measurements: Height: 5\' 7"  (170.2 cm) Weight: 126 lb 6.4 oz (57.3 kg) IBW/kg (Calculated) : 66.1 Heparin Dosing Weight: 57kg  Vital Signs: Temp: 98.2 F (36.8 C) (12/12 0350) Temp Source: Oral (12/11 2240) BP: 187/68 (12/12 0350) Pulse Rate: 72 (12/12 0350)  Labs:  Recent Labs  03/09/16 1716 03/09/16 2323 03/10/16 0409  HGB 11.2*  --  11.6*  HCT 34.6*  --  36.9*  PLT 194  --  182  APTT  --   --  39*  LABPROT  --   --  14.6  INR  --   --  1.13  CREATININE 4.19*  --   --   TROPONINI 0.67* 0.72*  --     Estimated Creatinine Clearance: 11.6 mL/min (by C-G formula based on SCr of 4.19 mg/dL (H)).   Medical History: Past Medical History:  Diagnosis Date  . Anemia of chronic disease   . 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 Blind   . Blindness of both eyes   . Blood in stool   . Chronic kidney disease   . CKD (chronic kidney disease)   . Diabetes mellitus without complication ([place])   . Edentulism   . ESRD (end stage renal disease) ([place])   . ESRD (end stage renal disease) ([place])   . Fecal incontinence   . Glaucoma   . History of fall   . Hypertension   . Iritis   . MGUS (monoclonal gammopathy of unknown significance)   . Nocturnal cough   . Peripheral vascular disease ([place])   . Pneumonia   . Protein calorie malnutrition ([place])   . PVD (peripheral vascular disease) ([place])   . Renal cyst     Medications:    Assessment: No anticoag in PTA meds  Goal of Therapy:  Heparin level 0.3-0.7 units/ml Monitor platelets by anticoagulation protocol: Yes   Plan:  3400 unit bolus and initial rate of 700 units/hr. First heparin level 8 hours after start of infusion  Rosell Khouri S 03/10/2016,4:50 AM

## 2016-03-10 NOTE — Progress Notes (Signed)
Pre hd info 

## 2016-03-11 LAB — GLUCOSE, CAPILLARY
GLUCOSE-CAPILLARY: 132 mg/dL — AB (ref 65–99)
GLUCOSE-CAPILLARY: 170 mg/dL — AB (ref 65–99)
Glucose-Capillary: 136 mg/dL — ABNORMAL HIGH (ref 65–99)

## 2016-03-11 LAB — HEMOGLOBIN A1C
Hgb A1c MFr Bld: 7.8 % — ABNORMAL HIGH (ref 4.8–5.6)
Mean Plasma Glucose: 177 mg/dL

## 2016-03-11 MED ORDER — HYDRALAZINE HCL 25 MG PO TABS: 25.0000 mg | ORAL_TABLET | Freq: Three times a day (TID) | ORAL | 0 refills | Status: DC

## 2016-03-11 NOTE — Progress Notes (Signed)
PT Cancellation Note  Patient Details Name: [name] MRN: [id number] DOB: Jun 01, 1934   Cancelled Treatment:    Reason Eval/Treat Not Completed: Other (comment) (Pt in a bath and will check later.)   Ramond Dial 03/11/2016, 11:09 AM   Mee Hives, PT MS Acute Rehab Dept. Number: [place] and [place]

## 2016-03-11 NOTE — Care Management Important Message (Signed)
Important Message  Patient Details  Name: [name] MRN: [id number] Date of Birth: 03/03/1935   Medicare Important Message Given:  N/A - LOS <3 / Initial given by admissions    Katrina Stack, RN 03/11/2016, 4:58 PM

## 2016-03-11 NOTE — Progress Notes (Signed)
SUBJECTIVE: Patient is feeling much better no chest pain or shortness of breath   Vitals:   03/10/16 1417 03/10/16 1955 03/11/16 0555 03/11/16 0735  BP: (!) 168/72 (!) 142/57 (!) 176/64 (!) 148/56  Pulse: 79 79 71 81  Resp: 20 18 18 18   Temp:  97.7 F (36.5 C) 98.3 F (36.8 C)   TempSrc:  Oral Oral   SpO2: 99% 98% 95% 100%  Weight: 127 lb 3.2 oz (57.7 kg)  125 lb 6.4 oz (56.9 kg)   Height:        Intake/Output Summary (Last 24 hours) at 03/11/16 0904 Last data filed at 03/10/16 2310  Gross per 24 hour  Intake              126 ml  Output             1800 ml  Net            -1674 ml    LABS: Basic Metabolic Panel:  Recent Labs  03/09/16 1716 03/10/16 0409  NA 140 137  K 3.0* 3.4*  CL 97* 98*  CO2 31 29  GLUCOSE 97 139*  BUN 16 26*  CREATININE 4.19* 5.06*  CALCIUM 9.1 9.0   Liver Function Tests:  Recent Labs  03/10/16 0409  AST 35  ALT 28  ALKPHOS 55  BILITOT 0.8  PROT 7.7  ALBUMIN 3.7   No results for input(s): LIPASE, AMYLASE in the last 72 hours. CBC:  Recent Labs  03/09/16 1716 03/10/16 0409  WBC 6.2 5.4  HGB 11.2* 11.6*  HCT 34.6* 36.9*  MCV 72.2* 74.3*  PLT 194 182   Cardiac Enzymes:  Recent Labs  03/09/16 2323 03/10/16 0409 03/10/16 1015  TROPONINI 0.72* 0.68* 0.59*   BNP: Invalid input(s): POCBNP D-Dimer: No results for input(s): DDIMER in the last 72 hours. Hemoglobin A1C:  Recent Labs  03/09/16 2323  HGBA1C 7.8*   Fasting Lipid Panel:  Recent Labs  03/10/16 0409  CHOL 186  HDL 53  LDLCALC 105*  TRIG 140  CHOLHDL 3.5   Thyroid Function Tests:  Recent Labs  03/09/16 2323  TSH 0.722   Anemia Panel: No results for input(s): VITAMINB12, FOLATE, FERRITIN, TIBC, IRON, RETICCTPCT in the last 72 hours.   PHYSICAL EXAM General: Well developed, well nourished, in no acute distress HEENT:  Normocephalic and atramatic Neck:  No JVD.  Lungs: Clear bilaterally to auscultation and percussion. Heart: HRRR . Normal  S1 and S2 without gallops or murmurs.  Abdomen: Bowel sounds are positive, abdomen soft and non-tender  Msk:  Back normal, normal gait. Normal strength and tone for age. Extremities: No clubbing, cyanosis or edema.   Neuro: Alert and oriented X 3. Psych:  Good affect, responds appropriately  TELEMETRY:Sinus rhythm  ASSESSMENT AND PLAN: Patient is feeling much better with congestive heart failure significantly improved with no shortness of breath orthopnea PND right now. Left ventricle ejection fraction was normal 60% with mild diastolic dysfunction and normal wall motion. There is mild MR and mild TR and mild AR. For some reason echocardiogram report is not being seen in the chart however these are the findings on echocardiogram.  Active Problems:   Acute exacerbation of congestive heart failure ([place])    Neoma Laming A, MD, [facility] 03/11/2016 9:04 AM

## 2016-03-11 NOTE — Progress Notes (Signed)
PT Cancellation Note  Patient Details Name: [name] MRN: [id number] DOB: 1934/08/15   Cancelled Treatment:    Reason Eval/Treat Not Completed: Other (comment) (leaving for  home when PT returned).   Ramond Dial 03/11/2016, 4:52 PM   Mee Hives, PT MS Acute Rehab Dept. Number: [place] and [place]

## 2016-03-11 NOTE — Care Management (Signed)
Informed by Francisco Capuchin with [facility] patient for discharge home.  Have asked that patient be transported home by non emergent ems and they are aware it is not medically necessary for patient to travel by ems and that agency will be responsible for payment.  Patient has a key to get into his house.  he has a CNA that is in the home between 4-6 and 8-10p.  [facility] has dropped off a wheelchair at the patient's home. Notified Cheryl with [facility] of discharge date.  Discharge summary is not available yet

## 2016-03-11 NOTE — Clinical Social Work Note (Signed)
MSW received referral for SNF.  Case discussed with case manager and plan is to discharge home.  MSW to sign off please re-consult if social work needs arise.  Iley Deignan R. Joeline Freer, MSW Mon-Fri 8a-4:30p [phone]   

## 2016-03-11 NOTE — Discharge Summary (Signed)
[name], is a 81 y.o. male  DOB 03-16-1935  MRN [id number].  Admission date:  03/09/2016  Admitting Physician  Harvie Bridge, DO  Discharge Date:  03/11/2016   Primary MD  Rinaldo Cloud, MD  Recommendations for primary care physician for things to follow:   Follow-up with [facility] doctors as scheduled.   Admission Diagnosis  Hypoxia [R09.02] Elevated troponin [R74.8] Acute on chronic congestive heart failure, unspecified congestive heart failure type ([place]) [I50.9]   Discharge Diagnosis  Hypoxia [R09.02] Elevated troponin [R74.8] Acute on chronic congestive heart failure, unspecified congestive heart failure type ([place]) [I50.9]    Active Problems:   Acute exacerbation of congestive heart failure [facility])      Past Medical History:  Diagnosis Date  . Anemia of chronic disease   . 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 Blind   . Blindness of both eyes   . Blood in stool   . Chronic kidney disease   . CKD (chronic kidney disease)   . Diabetes mellitus without complication ([place])   . Edentulism   . ESRD (end stage renal disease) ([place])   . ESRD (end stage renal disease) ([place])   . Fecal incontinence   . Glaucoma   . History of fall   . Hypertension   . Iritis   . MGUS (monoclonal gammopathy of unknown significance)   . Nocturnal cough   . Peripheral vascular disease ([place])   . Pneumonia   . Protein calorie malnutrition ([place])   . PVD (peripheral vascular disease) ([place])   . Renal cyst     Past Surgical History:  Procedure Laterality Date  . ESOPHAGOGASTRODUODENOSCOPY (EGD) WITH PROPOFOL N/A 04/18/2015   Procedure: ESOPHAGOGASTRODUODENOSCOPY (EGD) WITH PROPOFOL;  Surgeon: Hulen Luster, MD;  Location: [facility] ENDOSCOPY;  Service: Gastroenterology;  Laterality: N/A;  . LEG AMPUTATION  ABOVE KNEE  left  . PROSTATECTOMY         History of present illness and  Hospital Course:     Kindly see H&P for history of present illness and admission details, please review complete Labs, Consult reports and Test reports for all details in brief  HPI  from the history and physical done on the day of admission  A 81 year old male patient with history of diabetes mellitus type 2, ESRD on hemodialysis, hypertension came in because of shortness of breath, admitted for CHF exacerbation. The O2 saturations were same 70s and he came, improved with 2 L of oxygen. Patient did not have any fever, chills. Had some dry cough.  Hospital Course  #1 new onset congestive heart failure: Patient admitted to telemetry, continued on oxygen, daily weights, nebulizers, seen by cardiology. Echocardiogram study of more than 50%. Patient hypoxia resolved. Patient had acute respiratory failure with hypoxia secondary to CHF exacerbation. Patient received extra hemodialysis yesterday. Patient has ESRD on hemodialysis Monday, Wednesday, Friday. Received extra hemodialysis on 12/12  that is Tuesday. 2. Later troponins secondary to demand ischemia due to CHF: Cardiology initially started on heparin but discontinue the heparin after patient did not have chest pain. Troponins elevated up to 0.68. But did not have any chest pain or EKG changes.  #3 acute hypoxic respiratory failure: Patient went for dialysis on Monday after the dialysis treatment patient still was hypoxic with 80% saturations on room air so patient was sent in to the hospital for further evaluation. Hypoxia resolved. 02 saturation 98% on room air. #4 diabetes mellitus type 2: Patient is on  Levemir, age. 5. essential hypertension: Controlled, continue clonidine, hydralazine, metoprolol, lisinopril. Discharge Condition: Stable  Follow UP  Follow-up Information    RILEY,ROBERT F, MD Follow up in 1 week(s).   Specialty:  Internal Medicine Why:  You  will need to call for an appointment, ccs Contact information: [address] [place] [address] [phone]             Discharge Instructions  and  Discharge Medications        Medication List    TAKE these medications   acetaminophen 500 MG tablet Commonly known as:  TYLENOL Take 500 mg by mouth every 6 (six) hours as needed.   amLODipine 5 MG tablet Commonly known as:  NORVASC Take 5 mg by mouth daily.   aspirin EC 81 MG tablet Take 81 mg by mouth daily.   cloNIDine 0.1 MG tablet Commonly known as:  CATAPRES Take 0.1 mg by mouth daily as needed. Give 0.1mg  po prn diastolic [id number] /or systolic b/p [id number]   gabapentin 300 MG capsule Commonly known as:  NEURONTIN Take 300 mg by mouth 3 (three) times daily.   hydrALAZINE 25 MG tablet Commonly known as:  APRESOLINE Take 1 tablet (25 mg total) by mouth every 8 (eight) hours.   insulin glargine 100 UNIT/ML injection Commonly known as:  LANTUS Inject 5 Units into the skin daily.   insulin NPH-regular Human (70-30) 100 UNIT/ML injection Commonly known as:  NOVOLIN 70/30 Inject 0-12 Units into the skin daily.   lisinopril 40 MG tablet Commonly known as:  PRINIVIL,ZESTRIL Take 40 mg by mouth daily.   metoprolol 50 MG tablet Commonly known as:  LOPRESSOR Take 50 mg by mouth daily.   multivitamin Tabs tablet Take 1 tablet by mouth daily.   sevelamer 800 MG tablet Commonly known as:  RENAGEL Take 1,600 mg by mouth 3 (three) times daily with meals.         Diet and Activity recommendation: See Discharge Instructions above   Consults obtained - cardio   Major procedures and Radiology Reports - PLEASE review detailed and final reports for all details, in brief -     Dg Chest 2 View  Result Date: 03/09/2016 CLINICAL DATA:  Patient states he was sent to ED from dialysis for increased SOB. Patient has a HX of chronic kidney disease, end stage renal failure, HTN, and diabetes. EXAM:  CHEST  2 VIEW COMPARISON:  11/13/2014 FINDINGS: The cardiac silhouette is borderline enlarged. No mediastinal or hilar masses. No evidence of adenopathy. There are prominent bronchovascular markings and mild interstitial thickening most evident in the lung bases. No definite pleural effusion. No pneumothorax. Skeletal structures are demineralized but grossly intact. IMPRESSION: 1. Mild interstitial thickening and prominent bronchovascular markings. Although this may all be chronic, mild interstitial edema is suspected. Electronically Signed   By: Lajean Manes M.D.   On: 03/09/2016 17:51   Ct Angio Chest Pe W And/or Wo Contrast  Result Date: 03/09/2016 CLINICAL DATA:  Hypoxia after dialysis.  Dyspnea and cough. EXAM: CT ANGIOGRAPHY CHEST WITH CONTRAST TECHNIQUE: Multidetector CT imaging of the chest was performed using the standard protocol during bolus administration of intravenous contrast. Multiplanar CT image reconstructions and MIPs were obtained to evaluate the vascular anatomy. CONTRAST:  75 cc of Isovue 370 IV COMPARISON:  CXR 03/09/2016, chest CT 11/10/2014 acute and new FINDINGS: Cardiovascular: No acute pulmonary embolus. There is aortic atherosclerosis without aneurysm nor dissection. Bovine arch anatomy with common trunk for the  brachiocephalic and left common carotid arteries. Calcified left subclavian artery origin. Heart is top-normal in size without pericardial effusion. There is coronary arteriosclerosis. Mediastinum/Nodes: Calcified mediastinal and right hilar lymph nodes are again noted consistent with old granulomatous disease. No significant change noted. The trachea, mainstem bronchi and esophagus are stable without acute appearing abnormalities. No thyromegaly. Lungs/Pleura: Left greater than right bilateral pleural effusions with adjacent compressive atelectasis are noted small on the right and small to moderate on the left. Subsegmental atelectasis noted in both bone lower lobes.  Patchy airspace opacities in the upper lobes, lingula, right middle lobe and both lower lobes may reflect changes from CHF. Respiratory motion artifact slightly limit assessment. Upper Abdomen: Left kidney is not visualized and may be atrophic or excluded on this study. Gallbladder is physiologically distended and appears to contain a few dependent calculi, series 4, image 94. No biliary dilatation. Hypodensity along the falciform ligament is likely related to partial volume averaging of the leaves of the falciform ligament. Musculoskeletal: The dorsal spine appears diffusely dense consistent with renal osteodystrophy. Multilevel degenerative disc disease along the mid to lower thoracic spine. No acute osseous abnormality. Review of the MIP images confirms the above findings. IMPRESSION: 1. No acute pulmonary embolus. 2. Small right and small to moderate left pleural effusions with adjacent atelectasis. Mild bilateral patchy alveolar airspace opacities in both upper lobes, lower lobes, right middle lobe and lingula may reflect changes from pulmonary edema. 3. Calcified mediastinal and hilar lymph nodes consistent with old granulomatous disease unchanged in appearance. 4. Tiny dependent calculi noted of the gallbladder without secondary signs of acute cholecystitis. 5. Renal osteodystrophy is believed to account for the dense appearance of the vertebral bodies of the dorsal spine in this patient on dialysis. 6. Bovine arch anatomy.  Aortic atherosclerosis. 7. Coronary arteriosclerosis. Electronically Signed   By: Ashley Royalty M.D.   On: 03/09/2016 19:08    Micro Results    Recent Results (from the past 240 hour(s))  MRSA PCR Screening     Status: None   Collection Time: 03/09/16 11:06 PM  Result Value Ref Range Status   MRSA by PCR NEGATIVE NEGATIVE Final    Comment:        The GeneXpert MRSA Assay (FDA approved for NASAL specimens only), is one component of a comprehensive MRSA  colonization surveillance program. It is not intended to diagnose MRSA infection nor to guide or monitor treatment for MRSA infections.        Today   Subjective:   Jacobs Tise today has no headache,no chest abdominal pain,no new weakness tingling or numbness, feels much better wants to go home today. With this program physician, patient is a blind has diabetes, hypertension, ESRD, hemodialysis. If patient decides to go home patient can be discharged home. Patient started mentioned to the nurse yesterday that she wanted a physical therapy evaluation for the patient.  Objective:   Blood pressure (!) 153/62, pulse 72, temperature 97.4 F (36.3 C), temperature source Oral, resp. rate 18, height 5\' 7"  (1.702 m), weight 56.9 kg (125 lb 6.4 oz), SpO2 98 %.   Intake/Output Summary (Last 24 hours) at 03/11/16 1716 Last data filed at 03/10/16 2310  Gross per 24 hour  Intake                6 ml  Output                0 ml  Net  6 ml    Exam Awake Alert, Oriented x 3, No new F.N deficits, Normal affect [place].AT,PERRAL Supple Neck,No JVD, No cervical lymphadenopathy appriciated.  Symmetrical Chest wall movement, Good air movement bilaterally, CTAB RRR,No Gallops,Rubs or new Murmurs, No Parasternal Heave +ve B.Sounds, Abd Soft, Non tender, No organomegaly appriciated, No rebound -guarding or rigidity. No Cyanosis, Clubbing or edema, No new Rash or bruise  Data Review   CBC w Diff: Lab Results  Component Value Date   WBC 5.4 03/10/2016   HGB 11.6 (L) 03/10/2016   HGB 9.7 (L) 11/18/2013   HCT 36.9 (L) 03/10/2016   HCT 31.6 (L) 11/18/2013   PLT 182 03/10/2016   PLT 235 11/18/2013   LYMPHOPCT 19 11/10/2014   LYMPHOPCT 9.3 11/18/2013   MONOPCT 14 11/10/2014   MONOPCT 8.4 11/18/2013   EOSPCT 2 11/10/2014   EOSPCT 2.9 11/18/2013   BASOPCT 1 11/10/2014   BASOPCT 0.5 11/18/2013    CMP: Lab Results  Component Value Date   NA 137 03/10/2016   NA 137 11/20/2013   K  3.4 (L) 03/10/2016   K 3.7 11/20/2013   CL 98 (L) 03/10/2016   CL 103 11/20/2013   CO2 29 03/10/2016   CO2 27 11/20/2013   BUN 26 (H) 03/10/2016   BUN 44 (H) 11/20/2013   CREATININE 5.06 (H) 03/10/2016   CREATININE 6.71 (H) 11/20/2013   PROT 7.7 03/10/2016   PROT 7.2 11/17/2013   ALBUMIN 3.7 03/10/2016   ALBUMIN 2.5 (L) 11/20/2013   BILITOT 0.8 03/10/2016   BILITOT 0.4 11/17/2013   ALKPHOS 55 03/10/2016   ALKPHOS 90 11/17/2013   AST 35 03/10/2016   AST 49 (H) 11/17/2013   ALT 28 03/10/2016   ALT 31 11/17/2013  .   Total Time in preparing paper work, data evaluation and todays exam - 33 minutes  Aulton Routt M.D on 03/11/2016 at 5:16 PM    Note: This dictation was prepared with Dragon dictation along with smaller phrase technology. Any transcriptional errors that result from this process are unintentional.

## 2016-03-11 NOTE — Progress Notes (Signed)
[facility] NAME: [name]    MR#:  [id number]  DATE OF BIRTH:  1935/03/14  SUBJECTIVE:admitted  For SOB/hypoxia with o2 sats 80% on RA when he came,  CHIEF COMPLAINT:   Chief Complaint  Patient presents with  . Cough  feels better.o sob or chest pain.o2 sats 99%on 2 litre's.receiving extra hd today,  REVIEW OF SYSTEMS:   ROS CONSTITUTIONAL: No fever, fatigue or weakness.  EYES: No blurred or double vision.  EARS, NOSE, AND THROAT: No tinnitus or ear pain.  RESPIRATORY: No cough, shortness of breath, wheezing or hemoptysis.  CARDIOVASCULAR: No chest pain, orthopnea, edema.  GASTROINTESTINAL: No nausea, vomiting, diarrhea or abdominal pain.  GENITOURINARY: No dysuria, hematuria.  ENDOCRINE: No polyuria, nocturia,  HEMATOLOGY: No anemia, easy bruising or bleeding SKIN: No rash or lesion. MUSCULOSKELETAL: No joint pain or arthritis.   NEUROLOGIC: No tingling, numbness, weakness.  PSYCHIATRY: No anxiety or depression.   DRUG ALLERGIES:  No Known Allergies  VITALS:  Blood pressure (!) 148/56, pulse 81, temperature 98.3 F (36.8 C), temperature source Oral, resp. rate 18, height 5\' 7"  (1.702 m), weight 56.9 kg (125 lb 6.4 oz), SpO2 100 %.  PHYSICAL EXAMINATION:  GENERAL:  81 y.o.-year-old patient lying in the bed with no acute distress.  EYES: Pupils equal, round, reactive to light and accommodation. He is legally blind.No scleral icterus. Extraocular muscles intact.  HEENT: Head atraumatic, normocephalic. Oropharynx and nasopharynx clear.  NECK:  Supple, no jugular venous distention. No thyroid enlargement, no tenderness.  LUNGS: Normal breath sounds bilaterally, no wheezing, rales,rhonchi or crepitation. No use of accessory muscles of respiration.  CARDIOVASCULAR: S1, S2 normal. No murmurs, rubs, or gallops.  ABDOMEN: Soft, nontender, nondistended. Bowel sounds present. No organomegaly or mass.  EXTREMITIES: No  pedal edema, cyanosis, or clubbing.  NEUROLOGIC: Cranial nerves II through XII are intact. Muscle strength 5/5 in all extremities. Sensation intact. Gait not checked.  PSYCHIATRIC: The patient is alert and oriented x 3.  SKIN: No obvious rash, lesion, or ulcer.    LABORATORY PANEL:   CBC  Recent Labs Lab 03/10/16 0409  WBC 5.4  HGB 11.6*  HCT 36.9*  PLT 182   ------------------------------------------------------------------------------------------------------------------  Chemistries   Recent Labs Lab 03/10/16 0409  NA 137  K 3.4*  CL 98*  CO2 29  GLUCOSE 139*  BUN 26*  CREATININE 5.06*  CALCIUM 9.0  AST 35  ALT 28  ALKPHOS 55  BILITOT 0.8   ------------------------------------------------------------------------------------------------------------------  Cardiac Enzymes  Recent Labs Lab 03/10/16 1015  TROPONINI 0.59*   ------------------------------------------------------------------------------------------------------------------  RADIOLOGY:  Dg Chest 2 View  Result Date: 03/09/2016 CLINICAL DATA:  Patient states he was sent to ED from dialysis for increased SOB. Patient has a HX of chronic kidney disease, end stage renal failure, HTN, and diabetes. EXAM: CHEST  2 VIEW COMPARISON:  11/13/2014 FINDINGS: The cardiac silhouette is borderline enlarged. No mediastinal or hilar masses. No evidence of adenopathy. There are prominent bronchovascular markings and mild interstitial thickening most evident in the lung bases. No definite pleural effusion. No pneumothorax. Skeletal structures are demineralized but grossly intact. IMPRESSION: 1. Mild interstitial thickening and prominent bronchovascular markings. Although this may all be chronic, mild interstitial edema is suspected. Electronically Signed   By: Lajean Manes M.D.   On: 03/09/2016 17:51   Ct Angio Chest Pe W And/or Wo Contrast  Result Date: 03/09/2016 CLINICAL DATA:  Hypoxia after dialysis.  Dyspnea and  cough. EXAM:  CT ANGIOGRAPHY CHEST WITH CONTRAST TECHNIQUE: Multidetector CT imaging of the chest was performed using the standard protocol during bolus administration of intravenous contrast. Multiplanar CT image reconstructions and MIPs were obtained to evaluate the vascular anatomy. CONTRAST:  75 cc of Isovue 370 IV COMPARISON:  CXR 03/09/2016, chest CT 11/10/2014 acute and new FINDINGS: Cardiovascular: No acute pulmonary embolus. There is aortic atherosclerosis without aneurysm nor dissection. Bovine arch anatomy with common trunk for the brachiocephalic and left common carotid arteries. Calcified left subclavian artery origin. Heart is top-normal in size without pericardial effusion. There is coronary arteriosclerosis. Mediastinum/Nodes: Calcified mediastinal and right hilar lymph nodes are again noted consistent with old granulomatous disease. No significant change noted. The trachea, mainstem bronchi and esophagus are stable without acute appearing abnormalities. No thyromegaly. Lungs/Pleura: Left greater than right bilateral pleural effusions with adjacent compressive atelectasis are noted small on the right and small to moderate on the left. Subsegmental atelectasis noted in both bone lower lobes. Patchy airspace opacities in the upper lobes, lingula, right middle lobe and both lower lobes may reflect changes from CHF. Respiratory motion artifact slightly limit assessment. Upper Abdomen: Left kidney is not visualized and may be atrophic or excluded on this study. Gallbladder is physiologically distended and appears to contain a few dependent calculi, series 4, image 94. No biliary dilatation. Hypodensity along the falciform ligament is likely related to partial volume averaging of the leaves of the falciform ligament. Musculoskeletal: The dorsal spine appears diffusely dense consistent with renal osteodystrophy. Multilevel degenerative disc disease along the mid to lower thoracic spine. No acute osseous  abnormality. Review of the MIP images confirms the above findings. IMPRESSION: 1. No acute pulmonary embolus. 2. Small right and small to moderate left pleural effusions with adjacent atelectasis. Mild bilateral patchy alveolar airspace opacities in both upper lobes, lower lobes, right middle lobe and lingula may reflect changes from pulmonary edema. 3. Calcified mediastinal and hilar lymph nodes consistent with old granulomatous disease unchanged in appearance. 4. Tiny dependent calculi noted of the gallbladder without secondary signs of acute cholecystitis. 5. Renal osteodystrophy is believed to account for the dense appearance of the vertebral bodies of the dorsal spine in this patient on dialysis. 6. Bovine arch anatomy.  Aortic atherosclerosis. 7. Coronary arteriosclerosis. Electronically Signed   By: Ashley Royalty M.D.   On: 03/09/2016 19:08    EKG:   Orders placed or performed during the hospital encounter of 03/09/16  . ED EKG  . ED EKG  . EKG 12-Lead  . EKG 12-Lead  . ED EKG  . ED EKG  . EKG 12-Lead  . EKG 12-Lead  . EKG 12-Lead    ASSESSMENT AND PLAN:   1.This is a 81 y.o. male with a history of Diabetes, end-stage renal disease on hemodialysis, hypertension  now being admitted with: 1. New-onset congestive heart failure,  EF 60%.Acute on chronic diastolic heart failure: shortness of breath with hemodialysis, now off oxygen. Breathing comfortably.  2. Elevated troponin, may be demand ischemia secondary to new onset of congestive heart failure ;seen by cardio 3. Hypokalemia-we'll give 20 mEq by mouth. 4. History of hypertension-continue Norvasc, Catapres, lisinopril, Lopressor 5. History of end-stage renal disease on hemodialysis-continue Renvela,nephro is following.extran HD today. 7. History of diabetes-insulin siding scale coverage 7. History of peripheral neuropathy-continue Neurontin 8.blindness in both eyes. Patient lives by himself with PACE program. Daughter is requesting  the placement, physical therapy was consulted. Patient is stable for discharge today when the arrangements are made. D/w  RN  All the records are reviewed and case discussed with Care Management/Social Workerr. Management plans discussed with the patient, family and they are in agreement.  CODE STATUS:full  TOTAL TIME TAKING CARE OF THIS PATIENT:35 minutes.   POSSIBLE D/C IN1-2 DAYS, DEPENDING ON CLINICAL CONDITION.   Epifanio Lesches M.D on 03/11/2016 at 9:33 AM  Between 7am to 6pm - Pager - [phone]  After 6pm go to [email or web address] - password [email or web address] [facility]  Office  [phone]  CC: Primary care physician; Rinaldo Cloud, MD   Note: This dictation was prepared with Dragon dictation along with smaller phrase technology. Any transcriptional errors that result from this process are unintentional.

## 2016-03-11 NOTE — Progress Notes (Signed)
Discharge instructions explained to pt/ iv and tele removed/ EMS called to transport home/ [facility]ACE programs following pt when he arrives home.

## 2016-03-11 NOTE — Progress Notes (Signed)
Pt being D/C'd home per MD order. Pt is followed by the PACE program. Daughter, [name] will be meeting EMS at the pt's home to let them in. Pt is pain free, no complaints. IV and cardiac monitoring have been removed.  Pt's belongings have been packed & D/C folder has been made. EMS taking him via stretcher. Vital signs stable as follows.  Vitals:   03/11/16 1151 03/11/16 1944  BP: (!) 153/62 (!) 134/59  Pulse: 72 74  Resp:  18  Temp: 97.4 F (36.3 C) 97.8 F (36.6 C)

## 2016-03-11 NOTE — Progress Notes (Signed)
Subjective:    patient is doing well 1800 cc removed with dialysis yesterday Blood pressure 153/62 today Oxygen saturation on room air has improved Patient denies any acute complaints   Objective:  Vital signs in last 24 hours:  Temp:  [97.4 F (36.3 C)-98.3 F (36.8 C)] 97.4 F (36.3 C) (12/13 1151) Pulse Rate:  [71-81] 72 (12/13 1151) Resp:  [18] 18 (12/13 0735) BP: (142-176)/(56-64) 153/62 (12/13 1151) SpO2:  [89 %-100 %] 98 % (12/13 1214) Weight:  [56.9 kg (125 lb 6.4 oz)] 56.9 kg (125 lb 6.4 oz) (12/13 0555)  Weight change: 0.748 kg (1 lb 10.4 oz) Filed Weights   03/10/16 1005 03/10/16 1417 03/11/16 0555  Weight: 58.4 kg (128 lb 12 oz) 57.7 kg (127 lb 3.2 oz) 56.9 kg (125 lb 6.4 oz)    Intake/Output:    Intake/Output Summary (Last 24 hours) at 03/11/16 1449 Last data filed at 03/10/16 2310  Gross per 24 hour  Intake                6 ml  Output                0 ml  Net                6 ml     Physical Exam: General: NAD, laying in bed  HEENT Left corneal opacity  Neck supple  Pulm/lungs Normal effort on movement, clear bilaterally  CVS/Heart Regular sinus rhythm  Abdomen:  Soft, NT  Extremities: Left AKA, rt no edema  Neurologic: Alert, oriented  Skin: No rashes  Access: Rt arm AVF       Basic Metabolic Panel:   Recent Labs Lab 03/09/16 1716 03/10/16 0409  NA 140 137  K 3.0* 3.4*  CL 97* 98*  CO2 31 29  GLUCOSE 97 139*  BUN 16 26*  CREATININE 4.19* 5.06*  CALCIUM 9.1 9.0     CBC:  Recent Labs Lab 03/09/16 1716 03/10/16 0409  WBC 6.2 5.4  HGB 11.2* 11.6*  HCT 34.6* 36.9*  MCV 72.2* 74.3*  PLT 194 182      Microbiology:  Recent Results (from the past 720 hour(s))  MRSA PCR Screening     Status: None   Collection Time: 03/09/16 11:06 PM  Result Value Ref Range Status   MRSA by PCR NEGATIVE NEGATIVE Final    Comment:        The GeneXpert MRSA Assay (FDA approved for NASAL specimens only), is one component of  a comprehensive MRSA colonization surveillance program. It is not intended to diagnose MRSA infection nor to guide or monitor treatment for MRSA infections.     Coagulation Studies:  Recent Labs  03/10/16 0409  LABPROT 14.6  INR 1.13    Urinalysis: No results for input(s): COLORURINE, LABSPEC, PHURINE, GLUCOSEU, HGBUR, BILIRUBINUR, KETONESUR, PROTEINUR, UROBILINOGEN, NITRITE, LEUKOCYTESUR in the last 72 hours.  Invalid input(s): APPERANCEUR    Imaging: Dg Chest 2 View  Result Date: 03/09/2016 CLINICAL DATA:  Patient states he was sent to ED from dialysis for increased SOB. Patient has a HX of chronic kidney disease, end stage renal failure, HTN, and diabetes. EXAM: CHEST  2 VIEW COMPARISON:  11/13/2014 FINDINGS: The cardiac silhouette is borderline enlarged. No mediastinal or hilar masses. No evidence of adenopathy. There are prominent bronchovascular markings and mild interstitial thickening most evident in the lung bases. No definite pleural effusion. No pneumothorax. Skeletal structures are demineralized but grossly intact. IMPRESSION: 1. Mild interstitial thickening  and prominent bronchovascular markings. Although this may all be chronic, mild interstitial edema is suspected. Electronically Signed   By: Lajean Manes M.D.   On: 03/09/2016 17:51   Ct Angio Chest Pe W And/or Wo Contrast  Result Date: 03/09/2016 CLINICAL DATA:  Hypoxia after dialysis.  Dyspnea and cough. EXAM: CT ANGIOGRAPHY CHEST WITH CONTRAST TECHNIQUE: Multidetector CT imaging of the chest was performed using the standard protocol during bolus administration of intravenous contrast. Multiplanar CT image reconstructions and MIPs were obtained to evaluate the vascular anatomy. CONTRAST:  75 cc of Isovue 370 IV COMPARISON:  CXR 03/09/2016, chest CT 11/10/2014 acute and new FINDINGS: Cardiovascular: No acute pulmonary embolus. There is aortic atherosclerosis without aneurysm nor dissection. Bovine arch anatomy with  common trunk for the brachiocephalic and left common carotid arteries. Calcified left subclavian artery origin. Heart is top-normal in size without pericardial effusion. There is coronary arteriosclerosis. Mediastinum/Nodes: Calcified mediastinal and right hilar lymph nodes are again noted consistent with old granulomatous disease. No significant change noted. The trachea, mainstem bronchi and esophagus are stable without acute appearing abnormalities. No thyromegaly. Lungs/Pleura: Left greater than right bilateral pleural effusions with adjacent compressive atelectasis are noted small on the right and small to moderate on the left. Subsegmental atelectasis noted in both bone lower lobes. Patchy airspace opacities in the upper lobes, lingula, right middle lobe and both lower lobes may reflect changes from CHF. Respiratory motion artifact slightly limit assessment. Upper Abdomen: Left kidney is not visualized and may be atrophic or excluded on this study. Gallbladder is physiologically distended and appears to contain a few dependent calculi, series 4, image 94. No biliary dilatation. Hypodensity along the falciform ligament is likely related to partial volume averaging of the leaves of the falciform ligament. Musculoskeletal: The dorsal spine appears diffusely dense consistent with renal osteodystrophy. Multilevel degenerative disc disease along the mid to lower thoracic spine. No acute osseous abnormality. Review of the MIP images confirms the above findings. IMPRESSION: 1. No acute pulmonary embolus. 2. Small right and small to moderate left pleural effusions with adjacent atelectasis. Mild bilateral patchy alveolar airspace opacities in both upper lobes, lower lobes, right middle lobe and lingula may reflect changes from pulmonary edema. 3. Calcified mediastinal and hilar lymph nodes consistent with old granulomatous disease unchanged in appearance. 4. Tiny dependent calculi noted of the gallbladder without  secondary signs of acute cholecystitis. 5. Renal osteodystrophy is believed to account for the dense appearance of the vertebral bodies of the dorsal spine in this patient on dialysis. 6. Bovine arch anatomy.  Aortic atherosclerosis. 7. Coronary arteriosclerosis. Electronically Signed   By: Ashley Royalty M.D.   On: 03/09/2016 19:08     Medications:    . amLODipine  5 mg Oral Daily  . aspirin EC  81 mg Oral Daily  . gabapentin  300 mg Oral TID  . hydrALAZINE  25 mg Oral Q8H  . insulin aspart  0-5 Units Subcutaneous QHS  . insulin aspart  0-9 Units Subcutaneous TID WC  . insulin glargine  5 Units Subcutaneous Daily  . lisinopril  40 mg Oral Daily  . metoprolol  50 mg Oral Daily  . multivitamin  1 tablet Oral Daily  . sevelamer carbonate  1,600 mg Oral TID WC  . sodium chloride flush  3 mL Intravenous Q12H  . sodium chloride flush  3 mL Intravenous Q12H   sodium chloride, acetaminophen, albuterol, bisacodyl, cloNIDine, magnesium citrate, ondansetron **OR** ondansetron (ZOFRAN) IV, oxyCODONE, senna-docusate, sodium chloride flush  Assessment/ Plan:  81 y.o. African-American male with end-stage renal disease, diabetes, hypertension, peripheral vascular disease, left above-the-knee amputation admitted for shortness of breath  [facility]//[address]//MWF  1. ESRD. - no acute indication for dialysis today - Follow up patient scheduled  2. Hypertension Blood pressure is better today Administer antihypertensives preferably at night  3. Secondary hyperparathyroidism Sevelamer Monitor phosphorus during this admission   LOS: 2 [name] 12/13/20172:49 PM

## 2016-07-01 ENCOUNTER — Encounter: Payer: Self-pay | Admitting: *Deleted

## 2016-07-03 NOTE — Discharge Instructions (Signed)

## 2016-07-07 ENCOUNTER — Encounter: Payer: Self-pay | Admitting: Ophthalmology

## 2016-07-07 ENCOUNTER — Other Ambulatory Visit
Admission: RE | Admit: 2016-07-07 | Discharge: 2016-07-07 | Disposition: A | Discharge status: Home / Self Care | Payer: Medicare (Managed Care) | Source: Ambulatory Visit | Attending: Student in an Organized Health Care Education/Training Program | Admitting: Student in an Organized Health Care Education/Training Program

## 2016-07-07 ENCOUNTER — Ambulatory Visit: Payer: Medicare (Managed Care) | Admitting: Student in an Organized Health Care Education/Training Program

## 2016-07-07 ENCOUNTER — Encounter
Admission: RE | Disposition: A | Discharge status: Home / Self Care | Payer: Self-pay | Source: Ambulatory Visit | Attending: Ophthalmology

## 2016-07-07 ENCOUNTER — Ambulatory Visit
Admission: RE | Admit: 2016-07-07 | Discharge: 2016-07-07 | Disposition: A | Discharge status: Home / Self Care | Payer: Medicare (Managed Care) | Source: Ambulatory Visit | Attending: Ophthalmology | Admitting: Ophthalmology

## 2016-07-07 DIAGNOSIS — I739 Peripheral vascular disease, unspecified: Secondary | ICD-10-CM | POA: Insufficient documentation

## 2016-07-07 DIAGNOSIS — H4089 Other specified glaucoma: Secondary | ICD-10-CM | POA: Diagnosis present

## 2016-07-07 DIAGNOSIS — I700 Atherosclerosis of aorta: Secondary | ICD-10-CM | POA: Insufficient documentation

## 2016-07-07 DIAGNOSIS — E1139 Type 2 diabetes mellitus with other diabetic ophthalmic complication: Secondary | ICD-10-CM | POA: Insufficient documentation

## 2016-07-07 DIAGNOSIS — I2510 Atherosclerotic heart disease of native coronary artery without angina pectoris: Secondary | ICD-10-CM | POA: Diagnosis not present

## 2016-07-07 DIAGNOSIS — Z89612 Acquired absence of left leg above knee: Secondary | ICD-10-CM | POA: Diagnosis not present

## 2016-07-07 DIAGNOSIS — I5032 Chronic diastolic (congestive) heart failure: Secondary | ICD-10-CM | POA: Diagnosis not present

## 2016-07-07 DIAGNOSIS — I132 Hypertensive heart and chronic kidney disease with heart failure and with stage 5 chronic kidney disease, or end stage renal disease: Secondary | ICD-10-CM | POA: Diagnosis not present

## 2016-07-07 DIAGNOSIS — Z87891 Personal history of nicotine dependence: Secondary | ICD-10-CM | POA: Diagnosis not present

## 2016-07-07 DIAGNOSIS — Z992 Dependence on renal dialysis: Secondary | ICD-10-CM | POA: Insufficient documentation

## 2016-07-07 DIAGNOSIS — N186 End stage renal disease: Secondary | ICD-10-CM | POA: Diagnosis not present

## 2016-07-07 DIAGNOSIS — E1140 Type 2 diabetes mellitus with diabetic neuropathy, unspecified: Secondary | ICD-10-CM | POA: Diagnosis not present

## 2016-07-07 DIAGNOSIS — E1122 Type 2 diabetes mellitus with diabetic chronic kidney disease: Secondary | ICD-10-CM | POA: Diagnosis not present

## 2016-07-07 DIAGNOSIS — F0390 Unspecified dementia without behavioral disturbance: Secondary | ICD-10-CM | POA: Diagnosis not present

## 2016-07-07 HISTORY — DX: Cardiac murmur, unspecified: R01.1

## 2016-07-07 HISTORY — DX: Acquired absence of unspecified leg above knee: Z89.619

## 2016-07-07 HISTORY — DX: Dependence on wheelchair: Z99.3

## 2016-07-07 HISTORY — DX: Atherosclerotic heart disease of native coronary artery without angina pectoris: I25.10

## 2016-07-07 HISTORY — DX: Myoneural disorder, unspecified: G70.9

## 2016-07-07 HISTORY — DX: Major depressive disorder, single episode, unspecified: F32.9

## 2016-07-07 HISTORY — DX: Heart failure, unspecified: I50.9

## 2016-07-07 HISTORY — DX: Malignant (primary) neoplasm, unspecified: C80.1

## 2016-07-07 HISTORY — PX: PHOTOCOAGULATION WITH LASER: SHX6027

## 2016-07-07 HISTORY — DX: Depression, unspecified: F32.A

## 2016-07-07 LAB — GLUCOSE, CAPILLARY
GLUCOSE-CAPILLARY: 149 mg/dL — AB (ref 65–99)
Glucose-Capillary: 143 mg/dL — ABNORMAL HIGH (ref 65–99)

## 2016-07-07 LAB — POTASSIUM: POTASSIUM: 3.1 mmol/L — AB (ref 3.5–5.1)

## 2016-07-07 SURGERY — PHOTOCOAGULATION, EYE, USING LASER
Anesthesia: Monitor Anesthesia Care | Site: Eye | Laterality: Right

## 2016-07-07 MED ORDER — ALFENTANIL 500 MCG/ML IJ INJ
INJECTION | INTRAMUSCULAR | Status: DC | PRN
Start: 2016-07-07 — End: 2016-07-07
  Administered 2016-07-07: 500 ug via INTRAVENOUS
  Administered 2016-07-07: 300 ug via INTRAVENOUS
  Administered 2016-07-07: 200 ug via INTRAVENOUS

## 2016-07-07 MED ORDER — LIDOCAINE HCL 2 % IJ SOLN
INTRAMUSCULAR | Status: DC | PRN
  Administered 2016-07-07: 8 mL via OPHTHALMIC

## 2016-07-07 MED ORDER — TRIAMCINOLONE ACETONIDE 40 MG/ML IJ SUSP
INTRAMUSCULAR | Status: DC | PRN
  Administered 2016-07-07: .2 mL

## 2016-07-07 MED ORDER — BSS IO SOLN
INTRAOCULAR | Status: DC | PRN
  Administered 2016-07-07: 1

## 2016-07-07 MED ORDER — NEOMYCIN-POLYMYXIN-DEXAMETH 3.5-10000-0.1 OP OINT
TOPICAL_OINTMENT | OPHTHALMIC | Status: DC | PRN
  Administered 2016-07-07: 1 via OPHTHALMIC

## 2016-07-07 MED ORDER — ACETAMINOPHEN 325 MG PO TABS
650.0000 mg | ORAL_TABLET | Freq: Once | ORAL | Status: AC | PRN
  Administered 2016-07-07: 650 mg via ORAL

## 2016-07-07 SURGICAL SUPPLY — 11 items
BANDAGE EYE OVAL (MISCELLANEOUS) ×6 IMPLANT
DEVICE G-PROBE SGL USE (Laser) ×1 IMPLANT
DEVICE MICRO PULS P3 SGL USE (Laser) IMPLANT
G-PROBE SGL USE (Laser) ×3
GAUZE SPONGE 4X4 12PLY STRL (GAUZE/BANDAGES/DRESSINGS) ×3 IMPLANT
NDL RETROBULBAR .5 NSTRL (NEEDLE) ×3 IMPLANT
NEEDLE FILTER BLUNT 18X 1/2SAF (NEEDLE) ×2
NEEDLE FILTER BLUNT 18X1 1/2 (NEEDLE) ×1 IMPLANT
SYRINGE 10CC LL (SYRINGE) ×3 IMPLANT
WATER STERILE IRR 250ML POUR (IV SOLUTION) ×3 IMPLANT
WATER STERILE IRR 500ML POUR (IV SOLUTION) IMPLANT

## 2016-07-07 NOTE — Transfer of Care (Signed)
Immediate Anesthesia Transfer of Care Note  Patient: [name]  Procedure(s) Performed: Procedure(s) with comments: PHOTOCOAGULATION WITH LASER  Right (Right) - IVA BLOCk  Diabetic Dialysis Pt - needs Potassium draw   Patient Location: PACU  Anesthesia Type: MAC, Regional  Level of Consciousness: awake, alert  and patient cooperative  Airway and Oxygen Therapy: Patient Spontanous Breathing and Patient connected to supplemental oxygen  Post-op Assessment: Post-op Vital signs reviewed, Patient's Cardiovascular Status Stable, Respiratory Function Stable, Patent Airway and No signs of Nausea or vomiting  Post-op Vital Signs: Reviewed and stable  Complications: No apparent anesthesia complications

## 2016-07-07 NOTE — H&P (Signed)
The History and Physical notes are on paper, have been signed, and are to be scanned.   I have examined the patient and there are no changes to the H&P.   [name] 07/07/2016 9:25 AM

## 2016-07-07 NOTE — Anesthesia Postprocedure Evaluation (Signed)
Anesthesia Post Note  Patient: [name]  Procedure(s) Performed: Procedure(s) (LRB): PHOTOCOAGULATION WITH LASER  Right (Right)  Patient location during evaluation: PACU Anesthesia Type: Regional and MAC Level of consciousness: awake and alert Pain management: pain level controlled Vital Signs Assessment: post-procedure vital signs reviewed and stable Respiratory status: spontaneous breathing, nonlabored ventilation, respiratory function stable and patient connected to nasal cannula oxygen Cardiovascular status: stable and blood pressure returned to baseline Anesthetic complications: no    Alisa Graff

## 2016-07-07 NOTE — Anesthesia Procedure Notes (Signed)
Procedure Name: MAC Date/Time: 07/07/2016 9:42 AM Performed by: Janna Arch Pre-anesthesia Checklist: Patient identified, Emergency Drugs available, Suction available and Patient being monitored Patient Re-evaluated:Patient Re-evaluated prior to inductionOxygen Delivery Method: Nasal cannula

## 2016-07-07 NOTE — Addendum Note (Signed)
Addendum  created 07/07/16 1133 by Alisa Graff, MD   Order list changed

## 2016-07-07 NOTE — Anesthesia Preprocedure Evaluation (Signed)
Anesthesia Evaluation  Patient identified by MRN, date of birth, ID band Patient awake    Reviewed: Allergy & Precautions, H&P , NPO status , Patient's Chart, lab work & pertinent test results, reviewed documented beta blocker date and time   Airway Mallampati: II  TM Distance: >3 FB Neck ROM: full    Dental no notable dental hx. (+) Edentulous Upper, Edentulous Lower   Pulmonary former smoker,    Pulmonary exam normal breath sounds clear to auscultation       Cardiovascular hypertension, + CAD, + Peripheral Vascular Disease and +CHF (diastolic)   Rhythm:regular Rate:Normal     Neuro/Psych negative neurological ROS  negative psych ROS   GI/Hepatic negative GI ROS, Neg liver ROS,   Endo/Other  negative endocrine ROSdiabetes  Renal/GU ESRF and Dialysis  negative genitourinary   Musculoskeletal   Abdominal   Peds  Hematology negative hematology ROS (+)   Anesthesia Other Findings   Reproductive/Obstetrics negative OB ROS                             Anesthesia Physical Anesthesia Plan  ASA: III  Anesthesia Plan: MAC and Regional   Post-op Pain Management:    Induction:   Airway Management Planned:   Additional Equipment:   Intra-op Plan:   Post-operative Plan:   Informed Consent: I have reviewed the patients History and Physical, chart, labs and discussed the procedure including the risks, benefits and alternatives for the proposed anesthesia with the patient or authorized representative who has indicated his/her understanding and acceptance.   Dental Advisory Given  Plan Discussed with: CRNA  Anesthesia Plan Comments:         Anesthesia Quick Evaluation

## 2016-07-07 NOTE — Op Note (Signed)
OPERATIVE NOTE  [name] [id number] 07/07/2016   PREOPERATIVE DIAGNOSIS:  Neovascular glaucoma, right eye. With uncontrolled intraocular pressure   POSTOPERATIVE DIAGNOSIS:    neovascular glaucoma, right eye.  With uncontrolled intraocular pressure   PROCEDURE:   1,  Transscleral cyclophotocoagulation, right eye. 2.  Intravitreal injection of kenalog, 0.09 mL 27m/mL.  Iridex G6 settings: Duration 2500 ms, Power: 2500 mW, 22 shots fired over 360 degrees.   SURGEON:  BBenay Pillow MD, MPH  ANESTHESIOLOGIST: Anesthesiologist: RAlisa Graff MD CRNA: JJanna Arch CRNA   ANESTHESIA:  MAC and retrobulbar.  ESTIMATED BLOOD LOSS: less than 1 mL.   COMPLICATIONS:  None.   DESCRIPTION OF PROCEDURE:  The patient was identified in the holding room and transported to the operating room and placed in the supine position.   The right eye was identified as the operative eye.  A retrobulbar block was performed, 5 mL. A sterile lid speculum was placed.  Initial power settings of 2000 msec 2000 MW were titrated up until a pop was heard at 3000 msec, 3000 mW, then the settings listed above were used for the remainder 19 additional shots.   Betadine was placed on the eye.  A sterile calipers was used to mark 3.5 mm posterior to the limbus inferotemporally.  A 30gauge needle was used to inject 0.09 kenalog intravitreally, and about 0.3 cc was also injected subconj inferonasally.  Maxitrol ointment and a patch were placed over the right eye.   The patient was taken to the recovery room in stable condition without complications of anesthesia or surgery  BBenay Pillow4/12/2016, 10:02 AM

## 2016-07-07 NOTE — OR Nursing (Signed)
Iridex G6 settings: Duration 2500 ms, Power: 2500 mW, 22 shots fired over 360 degrees.

## 2016-12-22 ENCOUNTER — Emergency Department: Payer: Medicare (Managed Care)

## 2016-12-22 ENCOUNTER — Inpatient Hospital Stay
Admission: EM | Admit: 2016-12-22 | Discharge: 2016-12-28 | DRG: 291 | Disposition: A | Discharge status: Skilled Nursing Facility | Payer: Medicare (Managed Care) | Attending: Internal Medicine | Admitting: Internal Medicine

## 2016-12-22 ENCOUNTER — Encounter: Payer: Self-pay | Admitting: Emergency Medicine

## 2016-12-22 DIAGNOSIS — N2581 Secondary hyperparathyroidism of renal origin: Secondary | ICD-10-CM | POA: Diagnosis present

## 2016-12-22 DIAGNOSIS — H409 Unspecified glaucoma: Secondary | ICD-10-CM | POA: Diagnosis present

## 2016-12-22 DIAGNOSIS — Z8042 Family history of malignant neoplasm of prostate: Secondary | ICD-10-CM

## 2016-12-22 DIAGNOSIS — I700 Atherosclerosis of aorta: Secondary | ICD-10-CM | POA: Diagnosis present

## 2016-12-22 DIAGNOSIS — Z9981 Dependence on supplemental oxygen: Secondary | ICD-10-CM

## 2016-12-22 DIAGNOSIS — I509 Heart failure, unspecified: Secondary | ICD-10-CM

## 2016-12-22 DIAGNOSIS — R011 Cardiac murmur, unspecified: Secondary | ICD-10-CM | POA: Diagnosis present

## 2016-12-22 DIAGNOSIS — Z9079 Acquired absence of other genital organ(s): Secondary | ICD-10-CM

## 2016-12-22 DIAGNOSIS — Z833 Family history of diabetes mellitus: Secondary | ICD-10-CM

## 2016-12-22 DIAGNOSIS — Z87891 Personal history of nicotine dependence: Secondary | ICD-10-CM

## 2016-12-22 DIAGNOSIS — D631 Anemia in chronic kidney disease: Secondary | ICD-10-CM | POA: Diagnosis present

## 2016-12-22 DIAGNOSIS — F329 Major depressive disorder, single episode, unspecified: Secondary | ICD-10-CM | POA: Diagnosis present

## 2016-12-22 DIAGNOSIS — E1140 Type 2 diabetes mellitus with diabetic neuropathy, unspecified: Secondary | ICD-10-CM | POA: Diagnosis present

## 2016-12-22 DIAGNOSIS — I132 Hypertensive heart and chronic kidney disease with heart failure and with stage 5 chronic kidney disease, or end stage renal disease: Secondary | ICD-10-CM | POA: Diagnosis not present

## 2016-12-22 DIAGNOSIS — H543 Unqualified visual loss, both eyes: Secondary | ICD-10-CM | POA: Diagnosis present

## 2016-12-22 DIAGNOSIS — I2510 Atherosclerotic heart disease of native coronary artery without angina pectoris: Secondary | ICD-10-CM | POA: Diagnosis present

## 2016-12-22 DIAGNOSIS — N186 End stage renal disease: Secondary | ICD-10-CM | POA: Diagnosis present

## 2016-12-22 DIAGNOSIS — J189 Pneumonia, unspecified organism: Secondary | ICD-10-CM | POA: Diagnosis present

## 2016-12-22 DIAGNOSIS — J440 Chronic obstructive pulmonary disease with acute lower respiratory infection: Secondary | ICD-10-CM | POA: Diagnosis present

## 2016-12-22 DIAGNOSIS — G709 Myoneural disorder, unspecified: Secondary | ICD-10-CM | POA: Diagnosis present

## 2016-12-22 DIAGNOSIS — E8770 Fluid overload, unspecified: Secondary | ICD-10-CM

## 2016-12-22 DIAGNOSIS — Z992 Dependence on renal dialysis: Secondary | ICD-10-CM

## 2016-12-22 DIAGNOSIS — Z794 Long term (current) use of insulin: Secondary | ICD-10-CM

## 2016-12-22 DIAGNOSIS — R0602 Shortness of breath: Secondary | ICD-10-CM

## 2016-12-22 DIAGNOSIS — I248 Other forms of acute ischemic heart disease: Secondary | ICD-10-CM | POA: Diagnosis not present

## 2016-12-22 DIAGNOSIS — J9621 Acute and chronic respiratory failure with hypoxia: Secondary | ICD-10-CM | POA: Diagnosis present

## 2016-12-22 DIAGNOSIS — Z89612 Acquired absence of left leg above knee: Secondary | ICD-10-CM

## 2016-12-22 DIAGNOSIS — I5033 Acute on chronic diastolic (congestive) heart failure: Secondary | ICD-10-CM | POA: Diagnosis present

## 2016-12-22 DIAGNOSIS — Z7982 Long term (current) use of aspirin: Secondary | ICD-10-CM

## 2016-12-22 DIAGNOSIS — D472 Monoclonal gammopathy: Secondary | ICD-10-CM | POA: Diagnosis present

## 2016-12-22 DIAGNOSIS — E1122 Type 2 diabetes mellitus with diabetic chronic kidney disease: Secondary | ICD-10-CM | POA: Diagnosis present

## 2016-12-22 DIAGNOSIS — J9601 Acute respiratory failure with hypoxia: Secondary | ICD-10-CM | POA: Diagnosis present

## 2016-12-22 DIAGNOSIS — Z993 Dependence on wheelchair: Secondary | ICD-10-CM

## 2016-12-22 LAB — BASIC METABOLIC PANEL WITH GFR
Anion gap: 16 — ABNORMAL HIGH (ref 5–15)
BUN: 36 mg/dL — ABNORMAL HIGH (ref 6–20)
CO2: 32 mmol/L (ref 22–32)
Calcium: 6.9 mg/dL — ABNORMAL LOW (ref 8.9–10.3)
Chloride: 93 mmol/L — ABNORMAL LOW (ref 101–111)
Creatinine, Ser: 5.33 mg/dL — ABNORMAL HIGH (ref 0.61–1.24)
GFR calc Af Amer: 11 mL/min — ABNORMAL LOW
GFR calc non Af Amer: 9 mL/min — ABNORMAL LOW
Glucose, Bld: 145 mg/dL — ABNORMAL HIGH (ref 65–99)
Potassium: 3.6 mmol/L (ref 3.5–5.1)
Sodium: 141 mmol/L (ref 135–145)

## 2016-12-22 LAB — CBC
HCT: 39 % — ABNORMAL LOW (ref 40.0–52.0)
Hemoglobin: 12.2 g/dL — ABNORMAL LOW (ref 13.0–18.0)
MCH: 20.8 pg — AB (ref 26.0–34.0)
MCHC: 31.3 g/dL — AB (ref 32.0–36.0)
MCV: 66.4 fL — AB (ref 80.0–100.0)
PLATELETS: 244 10*3/uL (ref 150–440)
RBC: 5.88 MIL/uL (ref 4.40–5.90)
RDW: 21.7 % — ABNORMAL HIGH (ref 11.5–14.5)
WBC: 4.7 10*3/uL (ref 3.8–10.6)

## 2016-12-22 LAB — GLUCOSE, CAPILLARY: Glucose-Capillary: 119 mg/dL — ABNORMAL HIGH (ref 65–99)

## 2016-12-22 LAB — TROPONIN I
TROPONIN I: 0.11 ng/mL — AB (ref <0.03)
Troponin I: 0.1 ng/mL (ref <0.03)

## 2016-12-22 MED ORDER — SEVELAMER CARBONATE 800 MG PO TABS
800.0000 mg | ORAL_TABLET | Freq: Three times a day (TID) | ORAL | Status: DC
  Administered 2016-12-23 – 2016-12-28 (×14): 800 mg via ORAL
  Filled 2016-12-22 (×14): qty 1

## 2016-12-22 MED ORDER — GABAPENTIN 400 MG PO CAPS
400.0000 mg | ORAL_CAPSULE | Freq: Every day | ORAL | Status: DC
  Administered 2016-12-23 – 2016-12-27 (×5): 400 mg via ORAL
  Filled 2016-12-22 (×5): qty 1

## 2016-12-22 MED ORDER — ASPIRIN EC 81 MG PO TBEC
81.0000 mg | DELAYED_RELEASE_TABLET | Freq: Every day | ORAL | Status: DC
  Administered 2016-12-23 – 2016-12-27 (×5): 81 mg via ORAL
  Filled 2016-12-22 (×5): qty 1

## 2016-12-22 MED ORDER — ACETAMINOPHEN 650 MG RE SUPP: 650.0000 mg | Freq: Four times a day (QID) | RECTAL | Status: DC | PRN

## 2016-12-22 MED ORDER — ONDANSETRON 4 MG PO TBDP: 4.0000 mg | ORAL_TABLET | Freq: Three times a day (TID) | ORAL | Status: DC | PRN

## 2016-12-22 MED ORDER — HEPARIN SODIUM (PORCINE) 5000 UNIT/ML IJ SOLN
5000.0000 [IU] | Freq: Three times a day (TID) | INTRAMUSCULAR | Status: DC
  Administered 2016-12-22 – 2016-12-28 (×15): 5000 [IU] via SUBCUTANEOUS
  Filled 2016-12-22 (×14): qty 1

## 2016-12-22 MED ORDER — LEVOFLOXACIN IN D5W 750 MG/150ML IV SOLN
750.0000 mg | Freq: Once | INTRAVENOUS | Status: AC
  Administered 2016-12-22: 750 mg via INTRAVENOUS
  Filled 2016-12-22: qty 150

## 2016-12-22 MED ORDER — DORZOLAMIDE HCL-TIMOLOL MAL 2-0.5 % OP SOLN
1.0000 [drp] | Freq: Two times a day (BID) | OPHTHALMIC | Status: DC
  Administered 2016-12-22 – 2016-12-28 (×12): 1 [drp] via OPHTHALMIC
  Filled 2016-12-22: qty 10

## 2016-12-22 MED ORDER — INSULIN ASPART 100 UNIT/ML ~~LOC~~ SOLN
0.0000 [IU] | Freq: Every day | SUBCUTANEOUS | Status: DC
  Administered 2016-12-24: 2 [IU] via SUBCUTANEOUS
  Filled 2016-12-22: qty 1

## 2016-12-22 MED ORDER — AMLODIPINE BESYLATE 5 MG PO TABS
7.5000 mg | ORAL_TABLET | Freq: Every day | ORAL | Status: DC
  Administered 2016-12-23 – 2016-12-24 (×2): 7.5 mg via ORAL
  Filled 2016-12-22 (×2): qty 2

## 2016-12-22 MED ORDER — GLIPIZIDE 2.5 MG HALF TABLET
2.5000 mg | ORAL_TABLET | Freq: Every day | ORAL | Status: DC
  Administered 2016-12-23 – 2016-12-26 (×4): 2.5 mg via ORAL
  Filled 2016-12-22 (×4): qty 1

## 2016-12-22 MED ORDER — CARVEDILOL 12.5 MG PO TABS
12.5000 mg | ORAL_TABLET | Freq: Two times a day (BID) | ORAL | Status: DC
  Administered 2016-12-23 – 2016-12-24 (×2): 12.5 mg via ORAL
  Filled 2016-12-22 (×2): qty 1

## 2016-12-22 MED ORDER — RENA-VITE PO TABS
1.0000 | ORAL_TABLET | Freq: Every day | ORAL | Status: DC
  Administered 2016-12-23 – 2016-12-27 (×5): 1 via ORAL
  Filled 2016-12-22 (×6): qty 1

## 2016-12-22 MED ORDER — SERTRALINE HCL 50 MG PO TABS
25.0000 mg | ORAL_TABLET | Freq: Every day | ORAL | Status: DC
  Administered 2016-12-23 – 2016-12-27 (×5): 25 mg via ORAL
  Filled 2016-12-22 (×5): qty 1

## 2016-12-22 MED ORDER — CALCIUM POLYCARBOPHIL 625 MG PO TABS
1250.0000 mg | ORAL_TABLET | Freq: Every day | ORAL | Status: DC
  Administered 2016-12-23 – 2016-12-27 (×4): 1250 mg via ORAL
  Filled 2016-12-22 (×6): qty 2

## 2016-12-22 MED ORDER — ACETAMINOPHEN 325 MG PO TABS: 650.0000 mg | ORAL_TABLET | Freq: Four times a day (QID) | ORAL | Status: DC | PRN

## 2016-12-22 MED ORDER — LISINOPRIL 20 MG PO TABS
40.0000 mg | ORAL_TABLET | Freq: Every day | ORAL | Status: DC
  Administered 2016-12-23 – 2016-12-27 (×4): 40 mg via ORAL
  Filled 2016-12-22 (×4): qty 2

## 2016-12-22 MED ORDER — ACETAMINOPHEN 500 MG PO TABS: 500.0000 mg | ORAL_TABLET | Freq: Two times a day (BID) | ORAL | Status: DC | PRN

## 2016-12-22 MED ORDER — INSULIN ASPART 100 UNIT/ML ~~LOC~~ SOLN
0.0000 [IU] | Freq: Three times a day (TID) | SUBCUTANEOUS | Status: DC
  Administered 2016-12-23 – 2016-12-24 (×2): 1 [IU] via SUBCUTANEOUS
  Administered 2016-12-25 – 2016-12-26 (×2): 3 [IU] via SUBCUTANEOUS
  Administered 2016-12-26: 2 [IU] via SUBCUTANEOUS
  Filled 2016-12-22 (×5): qty 1

## 2016-12-22 NOTE — H&P (Signed)
[name] is an 82 y.o. male.   Chief Complaint: Cough and not being able to sleep HPI: This is a 82 year old male who has history of end-stage renal disease. He gets dialysis on Monday, Wednesday and Friday. Last dialysis was yesterday. He complains he could not sleep last night because he kept waking up coughing. Did not complain of any shortness of breath. Here in the ER he was found to be in pulmonary edema. Hospitalist services were consulted for admission.  Past Medical History:  Diagnosis Date  . Anemia of chronic disease   . 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 Blindness of both eyes   . Blood in stool   . Cancer [facility])    prostate  . CHF (congestive heart failure) (HCC)    chronic diastolic heart failure  . Chronic kidney disease   . Coronary artery disease    aortic atherosclerosis  . Depression   . Diabetes mellitus without complication ([place])   . Edentulism   . ESRD (end stage renal disease) ([place])    dialysis-[facility] (M<W<F)  . Fecal incontinence   . Glaucoma   . Heart murmur    occasional irreg beat  . History of fall   . History of leg amputation ([place]) 1991   AK left  . Hypertension   . Iritis   . MGUS (monoclonal gammopathy of unknown significance)   . Neuromuscular disorder (HCC)    neuropathy  . Nocturnal cough   . Peripheral vascular disease ([place])   . Pneumonia    hx of  . Protein calorie malnutrition ([place])   . Renal cyst   . Wheelchair bound     Past Surgical History:  Procedure Laterality Date  . dialysis port Right    upper arm  . ESOPHAGOGASTRODUODENOSCOPY (EGD) WITH PROPOFOL N/A 04/18/2015   Procedure: ESOPHAGOGASTRODUODENOSCOPY (EGD) WITH PROPOFOL;  Surgeon: Hulen Luster, MD;  Location: [facility] ENDOSCOPY;  Service: Gastroenterology;  Laterality: N/A;  . EYE SURGERY    . LEG AMPUTATION ABOVE KNEE  left  . PHOTOCOAGULATION WITH LASER Right 07/07/2016   Procedure: PHOTOCOAGULATION WITH LASER  Right;  Surgeon: Eulogio Bear,  MD;  Location: [place];  Service: Ophthalmology;  Laterality: Right;  IVA BLOCk  Diabetic Dialysis Pt - needs Potassium draw   . PROSTATECTOMY      Family History  Problem Relation Age of Onset  . Diabetes Mother   . Diabetes Father   . Diabetes Sister   . Diabetes Brother    Social History:  reports that he quit smoking about 26 years ago. He has never used smokeless tobacco. He reports that he does not drink alcohol or use drugs.  Allergies: No Known Allergies   (Not in a hospital admission)  Results for orders placed or performed during the hospital encounter of 12/22/16 (from the past 48 hour(s))  Basic metabolic panel     Status: Abnormal   Collection Time: 12/22/16  2:43 PM  Result Value Ref Range   Sodium 141 135 - 145 mmol/L   Potassium 3.6 3.5 - 5.1 mmol/L   Chloride 93 (L) 101 - 111 mmol/L   CO2 32 22 - 32 mmol/L   Glucose, Bld 145 (H) 65 - 99 mg/dL   BUN 36 (H) 6 - 20 mg/dL   Creatinine, Ser 5.33 (H) 0.61 - 1.24 mg/dL   Calcium 6.9 (L) 8.9 - 10.3 mg/dL   GFR calc non Af Amer 9 (L) >60 mL/min  GFR calc Af Amer 11 (L) >60 mL/min    Comment: (NOTE) The eGFR has been calculated using the CKD EPI equation. This calculation has not been validated in all clinical situations. eGFR's persistently <60 mL/min signify possible Chronic Kidney Disease.    Anion gap 16 (H) 5 - 15  CBC     Status: Abnormal   Collection Time: 12/22/16  2:43 PM  Result Value Ref Range   WBC 4.7 3.8 - 10.6 K/uL   RBC 5.88 4.40 - 5.90 MIL/uL   Hemoglobin 12.2 (L) 13.0 - 18.0 g/dL   HCT 39.0 (L) 40.0 - 52.0 %   MCV 66.4 (L) 80.0 - 100.0 fL   MCH 20.8 (L) 26.0 - 34.0 pg   MCHC 31.3 (L) 32.0 - 36.0 g/dL   RDW 21.7 (H) 11.5 - 14.5 %   Platelets 244 150 - 440 K/uL  Troponin I     Status: Abnormal   Collection Time: 12/22/16  2:43 PM  Result Value Ref Range   Troponin I 0.11 (HH) <0.03 ng/mL    Comment: CRITICAL RESULT CALLED TO, READ BACK BY AND VERIFIED WITH Debby Freiberg RN  AT 1525 12/22/16. MSS    Dg Chest 2 View  Result Date: 12/22/2016 CLINICAL DATA:  Chest pain since last night EXAM: CHEST  2 VIEW COMPARISON:  March 09, 2016 FINDINGS: The mediastinal contour is normal. Heart size is enlarged. There is increased pulmonary interstitium bilaterally. Patchy consolidation of left lung base is noted. Bilateral pleural effusions are identified. The bony structures are stable. IMPRESSION: Congestive heart failure. Patchy consolidation of left lung base with left pleural effusion, underlying pneumonia is not excluded. Electronically Signed   By: Abelardo Diesel M.D.   On: 12/22/2016 15:19    Review of Systems  Constitutional: Negative for fever.  HENT: Negative for hearing loss.   Eyes: Negative for blurred vision.  Respiratory: Positive for cough and shortness of breath.   Cardiovascular: Negative for chest pain.  Gastrointestinal: Negative for nausea and vomiting.  Genitourinary: Negative for dysuria.  Musculoskeletal: Negative for myalgias.  Skin: Negative for rash.  Neurological: Negative for dizziness.    Blood pressure (!) 163/67, pulse 80, temperature 98.8 F (37.1 C), temperature source Oral, resp. rate 18, height _0  (1.676 m), weight 54.4 kg (120 lb), SpO2 96 %. Physical Exam  Constitutional: He is oriented to person, place, and time. He appears well-developed and well-nourished. No distress.  HENT:  Head: Normocephalic and atraumatic.  Mouth/Throat: Oropharynx is clear and moist. No oropharyngeal exudate.  Eyes: No scleral icterus.  Bilateral cataracts  Neck: Normal range of motion. Neck supple. No JVD present. No tracheal deviation present. No thyromegaly present.  Cardiovascular: Normal rate and regular rhythm.   No murmur heard. Respiratory:  Mild bibasilar crackles. No use of accessory muscles.  GI: Soft. Bowel sounds are normal. He exhibits no distension and no mass. There is no tenderness.  Musculoskeletal: He exhibits no edema.  Left  AKA  Lymphadenopathy:    He has no cervical adenopathy.  Neurological: He is alert and oriented to person, place, and time.  Totally blind in both eyes.  Skin: Skin is warm and dry. No erythema.     Assessment/Plan 1. Congestive heart failure. Likely secondary to fluid overload. Nephrology has been consulted for acute dialysis. He had an echocardiogram done within the last year that showed an EF of 50% so no further cardiac workup is necessary at this time. Reading of chest x-ray did comment  on potential infiltrate. Patient was given 750 mg of Levaquin here in the ER. We'll repeat chest x-ray in the morning after dialysis before making decision on continuing antibiotics or not. Suspect this is all from CHF. 2. End-stage renal disease. As stated above nephrology been consulted for acute dialysis. We'll get him back on his dialysis schedule. 3. Elevated troponin. He's had this in the past. This just slightly above normal reference range. We'll repeat troponin. Suspect this is from his end-stage renal disease and strain from a CHF. 4. Hypertension. We'll continue his current medications  Total time spent 45 minutes   Baxter Hire, MD 12/22/2016, 5:10 PM

## 2016-12-22 NOTE — ED Triage Notes (Signed)
Pt brought by [facility]. They report his oxygen was in the 70s this morning when he got there.  He arrived on 2L NRB. Pt placed on 2 L [place] here.  Pt denies complaints.  Sent with EKG with inverted t waves.  Respirations unlabored. Does not wear O2 at baseline. Skin warm and dry.  Sent because "has fluid on lungs"; no xray was done.

## 2016-12-22 NOTE — ED Notes (Signed)
Patient denies pain and is resting comfortably.  

## 2016-12-22 NOTE — ED Notes (Signed)
Dr. Kerman Passey aware of Troponin of 0.11

## 2016-12-22 NOTE — ED Provider Notes (Signed)
[name] Department Provider Note  Time seen: 3:59 PM  I have reviewed the triage vital signs and the nursing notes.   HISTORY  Chief Complaint Abnormal ECG    HPI [name] is a 82 y.o. male With a past medical history of blindness, CHF, diabetes, end-stage renal disease on hemodialysis Monday, Wednesday, Friday, left BKA, hypertension, presents to the emergency department with shortness of breath. According to report the patient had been feeling short of breath today. Was seen by [facility] at home with a room air saturation in the 70s with no home O2 requirement at baseline. They placed the patient on supplemental oxygen and brought to the emergency department. Here the patient is in no distress, states mild shortness breath, states he has had a cough for the past several days. Denies fever, abdominal pain or chest pain, nausea vomiting or diarrhea. Patient does have a room air saturation around 80%, maintains low 90s on 6 L via nasal cannula.  Past Medical History:  Diagnosis Date  . Anemia of chronic disease   . 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 Blindness of both eyes   . Blood in stool   . Cancer [name])    prostate  . CHF (congestive heart failure) (HCC)    chronic diastolic heart failure  . Chronic kidney disease   . Coronary artery disease    aortic atherosclerosis  . Depression   . Diabetes mellitus without complication ([place])   . Edentulism   . ESRD (end stage renal disease) ([place])    dialysis-[facility] (M<W<F)  . Fecal incontinence   . Glaucoma   . Heart murmur    occasional irreg beat  . History of fall   . History of leg amputation ([place]) 1991   AK left  . Hypertension   . Iritis   . MGUS (monoclonal gammopathy of unknown significance)   . Neuromuscular disorder (HCC)    neuropathy  . Nocturnal cough   . Peripheral vascular disease ([place])   . Pneumonia    hx of  . Protein calorie malnutrition  ([place])   . Renal cyst   . Wheelchair bound     Patient Active Problem List   Diagnosis Date Noted  . Acute exacerbation of congestive heart failure ([place]) 03/09/2016  . Pneumonia 11/10/2014  . Hypoxia 11/10/2014  . Fluid overload 11/10/2014  . Elevated troponin 11/10/2014  . Healthcare-associated pneumonia 10/28/2014    Past Surgical History:  Procedure Laterality Date  . dialysis port Right    upper arm  . ESOPHAGOGASTRODUODENOSCOPY (EGD) WITH PROPOFOL N/A 04/18/2015   Procedure: ESOPHAGOGASTRODUODENOSCOPY (EGD) WITH PROPOFOL;  Surgeon: Hulen Luster, MD;  Location: [name] END[facility]COPY;  Service: Gastroenterology;  Laterality: N/A;  . EYE SURGERY    . LEG AMPUTATION ABOVE KNEE  left  . PHOTOCOAGULATION WITH LASER Right 07/07/2016   Procedure: PHOTOCOAGULATION WITH LASER  Right;  Surgeon: Eulogio Bear, MD;  Location: [place];  Service: Ophthalmology;  Laterality: Right;  IVA BLOCk  Diabetic Dialysis Pt - needs Potassium draw   . PROSTATECTOMY      Prior to Admission medications   Medication Sig Start Date End Date Taking? Authorizing Provider  acetaminophen (TYLENOL) 500 MG tablet Take 500 mg by mouth every 6 (six) hours as needed.    [provider]  amLODipine (NORVASC) 2.5 MG tablet Take 7.5 mg by mouth daily.    [provider]  aspirin EC 81 MG  tablet Take 81 mg by mouth daily.    [provider]  carvedilol (COREG) 12.5 MG tablet Take 12.5 mg by mouth 2 (two) times daily with a meal.    [provider]  cloNIDine (CATAPRES) 0.1 MG tablet Take 0.1 mg by mouth daily as needed. Give 0.1mg  po prn diastolic >196 /or systolic b/p 222    [provider]  erythromycin (ILOTYCIN) ophthalmic ointment 1 application once.    [provider]  gabapentin (NEURONTIN) 300 MG capsule Take 300 mg by mouth 3 (three) times daily.    [provider]  glipiZIDE (GLUCOTROL) 5 MG tablet Take 2.5 mg by mouth daily before  breakfast.    [provider]  hydrALAZINE (APRESOLINE) 25 MG tablet Take 1 tablet (25 mg total) by mouth every 8 (eight) hours. Patient not taking: Reported on 07/02/2016 03/11/16   Epifanio Lesches, MD  insulin glargine (LANTUS) 100 UNIT/ML injection Inject 5 Units into the skin daily.     [provider]  insulin NPH-regular Human (NOVOLIN 70/30) (70-30) 100 UNIT/ML injection Inject 0-12 Units into the skin daily.    [provider]  Iron-Vitamin C (VITRON-C) 65-125 MG TABS Take by mouth daily.    [provider]  lisinopril (PRINIVIL,ZESTRIL) 40 MG tablet Take 40 mg by mouth daily.     [provider]  metoprolol (LOPRESSOR) 50 MG tablet Take 50 mg by mouth daily.     [provider]  multivitamin (RENA-VIT) TABS tablet Take 1 tablet by mouth daily.    [provider]  ondansetron (ZOFRAN-ODT) 4 MG disintegrating tablet Take 4 mg by mouth every 8 (eight) hours as needed for nausea or vomiting.    [provider]  polycarbophil (FIBERCON) 625 MG tablet Take 1,250 mg by mouth daily.    [provider]  sertraline (ZOLOFT) 25 MG tablet Take 25 mg by mouth daily.    [provider]  sevelamer (RENAGEL) 800 MG tablet Take 1,600 mg by mouth 3 (three) times daily with meals.    [provider]  Vitamin D, Ergocalciferol, (DRISDOL) 50000 units CAPS capsule Take 50,000 Units by mouth every 30 (thirty) days.    [provider]  Vitamins A & D (VITAMIN A & D) ointment Apply 1 application topically daily. To buttocks    [provider]    No Known Allergies  Family History  Problem Relation Age of Onset  . Diabetes Mother   . Diabetes Father   . Diabetes Sister   . Diabetes Brother     Social History Social History  Substance Use Topics  . Smoking status: Former Smoker    Quit date: 03/09/1990  . Smokeless tobacco: Never Used     Comment: Doesn't recall details  . Alcohol  use No    Review of Systems Constitutional: Negative for fever. Cardiovascular: Negative for chest pain. Respiratory: mild shortness of breath. Positive for cough. Gastrointestinal: Negative for abdominal pain, vomiting and diarrhea. Genitourinary: rarely makes urine per patient Neurological: Negative for headache All other ROS negative  ____________________________________________   PHYSICAL EXAM:  VITAL SIGNS: ED Triage Vitals  Enc Vitals Group     BP 12/22/16 1433 (!) 161/56     Pulse Rate 12/22/16 1433 81     Resp 12/22/16 1433 17     Temp 12/22/16 1438 98.8 F (37.1 C)     Temp Source 12/22/16 1438 Oral     SpO2 12/22/16 1426 95 %  Weight 12/22/16 1433 120 lb (54.4 kg)     Height 12/22/16 1433 5\' 6"  (1.676 m)     Head Circumference --      Peak Flow --      Pain Score --      Pain Loc --      Pain Edu? --      Excl. in [place]? --     Constitutional: Alert and oriented. Well appearing and in no distress. Eyes: Normal exam ENT   Head: Normocephalic and atraumatic.   Mouth/Throat: Mucous membranes are moist. Cardiovascular: Normal rate, regular rhythm.  Respiratory: Normal respiratory effort without tachypnea nor retractions. Breath sounds are clear Gastrointestinal: Soft and nontender. No distention.  Musculoskeletal: Nontender with normal range of motion in all extremities.  Neurologic:  Normal speech and language. No gross focal neurologic deficits  Skin:  Skin is warm, dry and intact.  Psychiatric: Mood and affect are normal.   ____________________________________________    EKG  EKG reviewed and interpreted by myself shows normal sinus rhythm at 82 bpm, narrow QRS, left axis deviation, slight QTC prolongation otherwise normal intervals. Patient does have T-wave inversions in the lateral leads which appear to be new compared to 03/09/16.  ____________________________________________    RADIOLOGY  IMPRESSION: Congestive heart failure.  Patchy  consolidation of left lung base with left pleural effusion, underlying pneumonia is not excluded.  ____________________________________________   INITIAL IMPRESSION / ASSESSMENT AND PLAN / ED COURSE  Pertinent labs & imaging results that were available during my care of the patient were reviewed by me and considered in my medical decision making (see chart for details).  patient presents to the emergency department with shortness of breath and cough on and have a hypoxic room air saturation. Differential this time include pneumonia, CHF/pulmonary edema, ACS, PE. We will check labs, chest x-ray and continue to closely monitor. Overall the patient appears very well, no distress lying in bed comfortably on nasal cannula oxygen.  Patient's labs have resulted showing a troponin of 0.11, patient has an elevated troponin at baseline. Patient's chest x-ray does show congestive heart failure, with patchy consolidation left lung base which could represent pneumonia. Given the patient's acute onset of cough with difficulty breathing we will cover with antibiotics for pneumonia, send blood cultures and discussed with the hospitalist for admission as well as nephrology for possible dialysis. They show went to dialysis yesterday and received his full session.  ____________________________________________   FINAL CLINICAL IMPRESSION(S) / ED DIAGNOSES  congestive heart failure/pulmonary edema Pneumonia    Harvest Dark, MD 12/22/16 1606

## 2016-12-22 NOTE — ED Notes (Signed)
Pink sleeve placed on right arm due to fistula placement.

## 2016-12-22 NOTE — ED Notes (Signed)
Waiting on transport to the floor.

## 2016-12-23 ENCOUNTER — Observation Stay: Payer: Medicare (Managed Care)

## 2016-12-23 LAB — BASIC METABOLIC PANEL
Anion gap: 16 — ABNORMAL HIGH (ref 5–15)
BUN: 43 mg/dL — ABNORMAL HIGH (ref 6–20)
CALCIUM: 6.6 mg/dL — AB (ref 8.9–10.3)
CO2: 30 mmol/L (ref 22–32)
CREATININE: 6.05 mg/dL — AB (ref 0.61–1.24)
Chloride: 96 mmol/L — ABNORMAL LOW (ref 101–111)
GFR calc non Af Amer: 8 mL/min — ABNORMAL LOW (ref >60)
GFR, EST AFRICAN AMERICAN: 9 mL/min — AB (ref >60)
Glucose, Bld: 175 mg/dL — ABNORMAL HIGH (ref 65–99)
Potassium: 4 mmol/L (ref 3.5–5.1)
SODIUM: 142 mmol/L (ref 135–145)

## 2016-12-23 LAB — TROPONIN I
TROPONIN I: 0.08 ng/mL — AB (ref <0.03)
Troponin I: 0.09 ng/mL (ref <0.03)

## 2016-12-23 LAB — GLUCOSE, CAPILLARY
GLUCOSE-CAPILLARY: 118 mg/dL — AB (ref 65–99)
GLUCOSE-CAPILLARY: 124 mg/dL — AB (ref 65–99)
GLUCOSE-CAPILLARY: 90 mg/dL (ref 65–99)
Glucose-Capillary: 101 mg/dL — ABNORMAL HIGH (ref 65–99)

## 2016-12-23 MED ORDER — LEVOFLOXACIN IN D5W 500 MG/100ML IV SOLN
500.0000 mg | INTRAVENOUS | Status: DC
  Administered 2016-12-24 – 2016-12-26 (×2): 500 mg via INTRAVENOUS
  Filled 2016-12-23 (×2): qty 100

## 2016-12-23 MED ORDER — GUAIFENESIN-DM 100-10 MG/5ML PO SYRP
5.0000 mL | ORAL_SOLUTION | ORAL | Status: DC | PRN
  Administered 2016-12-23 (×2): 5 mL via ORAL
  Filled 2016-12-23 (×2): qty 5

## 2016-12-23 MED ORDER — LEVOFLOXACIN IN D5W 750 MG/150ML IV SOLN
750.0000 mg | Freq: Once | INTRAVENOUS | Status: DC
  Filled 2016-12-23: qty 150

## 2016-12-23 MED ORDER — LEVOFLOXACIN IN D5W 500 MG/100ML IV SOLN: 500.0000 mg | INTRAVENOUS | Status: DC

## 2016-12-23 MED ORDER — GUAIFENESIN-DM 100-10 MG/5ML PO SYRP
10.0000 mL | ORAL_SOLUTION | ORAL | Status: DC | PRN
  Administered 2016-12-23: 10 mL via ORAL
  Filled 2016-12-23: qty 10

## 2016-12-23 MED ORDER — HYDROCOD POLST-CPM POLST ER 10-8 MG/5ML PO SUER
5.0000 mL | Freq: Two times a day (BID) | ORAL | Status: DC
  Administered 2016-12-23 – 2016-12-28 (×10): 5 mL via ORAL
  Filled 2016-12-23 (×11): qty 5

## 2016-12-23 NOTE — Progress Notes (Signed)
Patient left earlier for hemodialysis

## 2016-12-23 NOTE — Progress Notes (Signed)
Patient's BP is elevated, 182/68.  meds such as lisinopril, coreg, and norvasc just now given post HD.  MD said to recheck BP in a hour.  Patient continues to cough frequently.  Robitussin changed to 9ml/dose

## 2016-12-23 NOTE — Progress Notes (Signed)
[facility]  ROUNDING NOTE   Subjective:   Patient seen and examined on hemodialysis. Tolerating treatment well. UF of 2 liters.     HEMODIALYSIS FLOWSHEET:  Blood Flow Rate (mL/min): 375 mL/min Arterial Pressure (mmHg): -160 mmHg Venous Pressure (mmHg): 220 mmHg Transmembrane Pressure (mmHg): 80 mmHg Ultrafiltration Rate (mL/min): 830 mL/min Dialysate Flow Rate (mL/min): 600 ml/min Conductivity: Machine : 13.9 Conductivity: Machine : 13.9 Dialysis Fluid Bolus: Normal Saline Bolus Amount (mL): 200 mL   Objective:  Vital signs in last 24 hours:  Temp:  [97.5 F (36.4 C)-98.8 F (37.1 C)] 97.5 F (36.4 C) (09/26 1115) Pulse Rate:  [77-91] 89 (09/26 1115) Resp:  [14-24] 24 (09/26 1115) BP: (153-192)/(56-71) 192/71 (09/26 1115) SpO2:  [89 %-98 %] 92 % (09/26 1115) Weight:  [54.4 kg (120 lb)-57.7 kg (127 lb 1.6 oz)] 57.7 kg (127 lb 1.6 oz) (09/25 2021)  Weight change:  Filed Weights   12/22/16 1433 12/22/16 2021  Weight: 54.4 kg (120 lb) 57.7 kg (127 lb 1.6 oz)    Intake/Output: No intake/output data recorded.   Intake/Output this shift:  Total I/O In: 150 [P.O.:150] Out: -   Physical Exam: General: NAD, laying in bed  Head: Normocephalic, atraumatic. Moist oral mucosal membranes  Eyes: Anicteric, PERRL  Neck: Supple, trachea midline  Lungs:  Crackles bilaterally  Heart: Regular rate and rhythm  Abdomen:  Soft, nontender,   Extremities: No peripheral edema.  Neurologic: Nonfocal, moving all four extremities  Skin: No lesions  Access: Right AVF    Basic Metabolic Panel:  Recent Labs Lab 12/22/16 1443 12/23/16 0303  NA 141 142  K 3.6 4.0  CL 93* 96*  CO2 32 30  GLUCOSE 145* 175*  BUN 36* 43*  CREATININE 5.33* 6.05*  CALCIUM 6.9* 6.6*    Liver Function Tests: No results for input(s): AST, ALT, ALKPHOS, BILITOT, PROT, ALBUMIN in the last 168 hours. No results for input(s): LIPASE, AMYLASE in the last 168 hours. No results for  input(s): AMMONIA in the last 168 hours.  CBC:  Recent Labs Lab 12/22/16 1443  WBC 4.7  HGB 12.2*  HCT 39.0*  MCV 66.4*  PLT 244    Cardiac Enzymes:  Recent Labs Lab 12/22/16 1443 12/22/16 2036 12/23/16 0303 12/23/16 0926  TROPONINI 0.11* 0.10* 0.09* 0.08*    BNP: Invalid input(s): POCBNP  CBG:  Recent Labs Lab 12/22/16 2132 12/23/16 0754  GLUCAP 119* 101*    Microbiology: Results for orders placed or performed during the hospital encounter of 12/22/16  Blood culture (routine x 2)     Status: None (Preliminary result)   Collection Time: 12/22/16  4:17 PM  Result Value Ref Range Status   Specimen Description BLOOD BLOOD LEFT FOREARM  Final   Special Requests   Final    BOTTLES DRAWN AEROBIC AND ANAEROBIC Blood Culture results may not be optimal due to an excessive volume of blood received in culture bottles   Culture NO GROWTH < 24 HOURS  Final   Report Status PENDING  Incomplete  Blood culture (routine x 2)     Status: None (Preliminary result)   Collection Time: 12/22/16  4:23 PM  Result Value Ref Range Status   Specimen Description BLOOD BLOOD LEFT HAND  Final   Special Requests   Final    BOTTLES DRAWN AEROBIC AND ANAEROBIC Blood Culture adequate volume   Culture NO GROWTH < 24 HOURS  Final   Report Status PENDING  Incomplete    Coagulation Studies:  No results for input(s): LABPROT, INR in the last 72 hours.  Urinalysis: No results for input(s): COLORURINE, LABSPEC, PHURINE, GLUCOSEU, HGBUR, BILIRUBINUR, KETONESUR, PROTEINUR, UROBILINOGEN, NITRITE, LEUKOCYTESUR in the last 72 hours.  Invalid input(s): APPERANCEUR    Imaging: Dg Chest 2 View  Result Date: 12/22/2016 CLINICAL DATA:  Chest pain since last night EXAM: CHEST  2 VIEW COMPARISON:  March 09, 2016 FINDINGS: The mediastinal contour is normal. Heart size is enlarged. There is increased pulmonary interstitium bilaterally. Patchy consolidation of left lung base is noted. Bilateral pleural  effusions are identified. The bony structures are stable. IMPRESSION: Congestive heart failure. Patchy consolidation of left lung base with left pleural effusion, underlying pneumonia is not excluded. Electronically Signed   By: Abelardo Diesel M.D.   On: 12/22/2016 15:19   Portable Chest 1 View  Result Date: 12/23/2016 CLINICAL DATA:  Clinical suspicion of pneumonia. Dialysis dependent renal failure. EXAM: PORTABLE CHEST 1 VIEW COMPARISON:  Chest x-ray dated December 23, 2016 FINDINGS: There is a moderate size left pleural effusion. There is confluent density laterally in the left mid lung likely reflecting pneumonia. There is a small right pleural effusion. There is patchy increased density in the right mid and lower lung. The interstitial markings are increased bilaterally. The heart is enlarged and the pulmonary vascularity engorged. There is calcification in the wall of the aortic arch. The observed bony thorax exhibits no acute abnormality. IMPRESSION: Patchy alveolar infiltrates bilaterally worrisome for pneumonia. There is underlying CHF with interstitial edema and moderate-sized left pleural effusion. Thoracic aortic atherosclerosis. Electronically Signed   By: David  Martinique M.D.   On: 12/23/2016 10:22     Medications:    . amLODipine  7.5 mg Oral Daily  . aspirin EC  81 mg Oral Daily  . carvedilol  12.5 mg Oral BID WC  . dorzolamide-timolol  1 drop Right Eye BID  . gabapentin  400 mg Oral Daily  . glipiZIDE  2.5 mg Oral QAC breakfast  . heparin  5,000 Units Subcutaneous Q8H  . insulin aspart  0-5 Units Subcutaneous QHS  . insulin aspart  0-9 Units Subcutaneous TID WC  . lisinopril  40 mg Oral Daily  . multivitamin  1 tablet Oral Daily  . polycarbophil  1,250 mg Oral Daily  . sertraline  25 mg Oral Daily  . sevelamer carbonate  800 mg Oral TID WC   acetaminophen **OR** acetaminophen, guaiFENesin-dextromethorphan, ondansetron  Assessment/ Plan:  [name] is a 82 y.o. black  male end stage renal disease on hemodialysis, hypertension, peripheral vascular disease, left AKA, MGUS, glaucoma, diabetes mellitus type II, congestive heart failure, blindness  MWF [facility] [place].   1. End Stage Renal Disease: on hemodialysis. MWF - seen and examined on hemodialysis. Tolerating treatment well. UF goal of 2 liters  2. Hypertension: elevated.  - amlodipine, carvedilol, lisinopril.   3. Anemia of chronic kidney disease: hemoglobin 12.2.  - EPO as outpatient.   4. Secondary Hyperparathyroidism:  - sevelamer with meals.   5. Diabetes mellitus type II with chronic kidney disease:  - continue glucose control.    LOS: 0 Cella Cappello 9/26/201811:26 AM

## 2016-12-23 NOTE — Progress Notes (Signed)
Pharmacy Antibiotic Note  [name] is a 82 y.o. male admitted on 12/22/2016 with CAP.  Pharmacy has been consulted for levofloxacin dosing.  Plan: Patient was given levofloxacin 750 mg IV x 1 in ED on 12/22/16. He is a dialysis patient so dosing should be 750 mg IV x 1 followed by 500 mg IV Q48H independent of HD. Will continue levofloxacin 500 mg IV Q48H starting tomorrow.   Height: 5\' 6"  (167.6 cm) Weight: 127 lb 1.6 oz (57.7 kg) IBW/kg (Calculated) : 63.8  Temp (24hrs), Avg:97.9 F (36.6 C), Min:97.5 F (36.4 C), Max:98.9 F (37.2 C)   Recent Labs Lab 12/22/16 1443 12/23/16 0303  WBC 4.7  --   CREATININE 5.33* 6.05*    Estimated Creatinine Clearance: 7.9 mL/min (A) (by C-G formula based on SCr of 6.05 mg/dL (H)).    No Known Allergies  Thank you for allowing pharmacy to be a part of this patient's care.  Laural Benes, Pharm.D., BCPS Clinical Pharmacist 12/23/2016 3:25 PM

## 2016-12-23 NOTE — Progress Notes (Signed)
[facility] [place] NAME: [name]    MR#:  [id number]  DATE OF BIRTH:  24-Jun-1934  SUBJECTIVE:  CHIEF COMPLAINT:  Patient is resting comfortably. Denies no shortness of breath while resting  REVIEW OF SYSTEMS:  CONSTITUTIONAL: No fever, fatigue or weakness.  EYES: No blurred or double vision.  EARS, NOSE, AND THROAT: No tinnitus or ear pain.  RESPIRATORY: No cough, shortness of breath, wheezing or hemoptysis.  CARDIOVASCULAR: No chest pain, orthopnea, edema.  GASTROINTESTINAL: No nausea, vomiting, diarrhea or abdominal pain.  GENITOURINARY: No dysuria, hematuria.  ENDOCRINE: No polyuria, nocturia,  HEMATOLOGY: No anemia, easy bruising or bleeding SKIN: No rash or lesion. MUSCULOSKELETAL: No joint pain or arthritis.   NEUROLOGIC: No tingling, numbness, weakness.  PSYCHIATRY: No anxiety or depression.   DRUG ALLERGIES:  No Known Allergies  VITALS:  Blood pressure (!) 182/68, pulse 96, temperature 98.9 F (37.2 C), temperature source Oral, resp. rate (!) 24, height 5\' 6"  (1.676 m), weight 57.7 kg (127 lb 1.6 oz), SpO2 100 %.  PHYSICAL EXAMINATION:  GENERAL:  82 y.o.-year-old patient lying in the bed with no acute distress.  EYES: Pupils equal, round, reactive to light and accommodation. No scleral icterus. Extraocular muscles intact.  HEENT: Head atraumatic, normocephalic. Oropharynx and nasopharynx clear.  NECK:  Supple, no jugular venous distention. No thyroid enlargement, no tenderness.  LUNGS: Normal breath sounds bilaterally, no wheezing, rales,rhonchi.positive crackles No use of accessory muscles of respiration.  CARDIOVASCULAR: S1, S2 normal. No murmurs, rubs, or gallops.  ABDOMEN: Soft, nontender, nondistended. Bowel sounds present. No organomegaly or mass.  EXTREMITIES: No pedal edema, cyanosis, or clubbing.  NEUROLOGIC: Cranial nerves II through XII are intact. Muscle strength 5/5 in all extremities. Sensation  intact. Gait not checked.  PSYCHIATRIC: The patient is alert and oriented x 3.  SKIN: No obvious rash, lesion, or ulcer.    LABORATORY PANEL:   CBC  Recent Labs Lab 12/22/16 1443  WBC 4.7  HGB 12.2*  HCT 39.0*  PLT 244   ------------------------------------------------------------------------------------------------------------------  Chemistries   Recent Labs Lab 12/23/16 0303  NA 142  K 4.0  CL 96*  CO2 30  GLUCOSE 175*  BUN 43*  CREATININE 6.05*  CALCIUM 6.6*   ------------------------------------------------------------------------------------------------------------------  Cardiac Enzymes  Recent Labs Lab 12/23/16 0926  TROPONINI 0.08*   ------------------------------------------------------------------------------------------------------------------  RADIOLOGY:  Dg Chest 2 View  Result Date: 12/22/2016 CLINICAL DATA:  Chest pain since last night EXAM: CHEST  2 VIEW COMPARISON:  March 09, 2016 FINDINGS: The mediastinal contour is normal. Heart size is enlarged. There is increased pulmonary interstitium bilaterally. Patchy consolidation of left lung base is noted. Bilateral pleural effusions are identified. The bony structures are stable. IMPRESSION: Congestive heart failure. Patchy consolidation of left lung base with left pleural effusion, underlying pneumonia is not excluded. Electronically Signed   By: Abelardo Diesel M.D.   On: 12/22/2016 15:19   Portable Chest 1 View  Result Date: 12/23/2016 CLINICAL DATA:  Clinical suspicion of pneumonia. Dialysis dependent renal failure. EXAM: PORTABLE CHEST 1 VIEW COMPARISON:  Chest x-ray dated December 23, 2016 FINDINGS: There is a moderate size left pleural effusion. There is confluent density laterally in the left mid lung likely reflecting pneumonia. There is a small right pleural effusion. There is patchy increased density in the right mid and lower lung. The interstitial markings are increased bilaterally. The  heart is enlarged and the pulmonary vascularity engorged. There is calcification in the wall of the  aortic arch. The observed bony thorax exhibits no acute abnormality. IMPRESSION: Patchy alveolar infiltrates bilaterally worrisome for pneumonia. There is underlying CHF with interstitial edema and moderate-sized left pleural effusion. Thoracic aortic atherosclerosis. Electronically Signed   By: David  Martinique M.D.   On: 12/23/2016 10:22    EKG:   Orders placed or performed during the hospital encounter of 12/22/16  . ED EKG within 10 minutes  . ED EKG within 10 minutes  . EKG 12-Lead  . EKG 12-Lead    ASSESSMENT AND PLAN:    #  Congestive heart failure.  secondary to fluid overload. For hemodialysis today He had an echocardiogram done within the last year that showed an EF of 50% so no further cardiac workup is necessary at this time.   # pneumonia Repeat chest x-ray with infiltrates Patient has received levofloxacin in the ED will continue the same   2. End-stage renal disease.  For hemodialysis today Nephrology is following  3. Elevated troponin.Suspect this is from his end-stage renal disease and strain from a CHF.  4. Hypertension. We'll continue his current medications Cora, lisinopril and amlodipine  5. Diabetes mellitus sliding scale insulin    All the records are reviewed and case discussed with Care Management/Social Workerr. Management plans discussed with the patient, family and they are in agreement.  CODE STATUS: fc   TOTAL TIME TAKING CARE OF THIS PATIENT: 36  minutes.   POSSIBLE D/C IN  1-2  DAYS, DEPENDING ON CLINICAL CONDITION.  Note: This dictation was prepared with Dragon dictation along with smaller phrase technology. Any transcriptional errors that result from this process are unintentional.   Nicholes Mango M.D on 12/23/2016 at 3:08 PM  Between 7am to 6pm - Pager - [phone] After 6pm go to [email or web address] - password [email or web address] [facility]  [place]  [facility]  Office  [phone]  CC: Primary care physician; Rinaldo Cloud, MD

## 2016-12-23 NOTE — Progress Notes (Signed)
Patient returned to his room from dialysis

## 2016-12-23 NOTE — Progress Notes (Signed)
Patient continues to cough even after robitussin dose was increased.  Dr Tressia Miners changed order from robitussin to tussionex

## 2016-12-23 NOTE — Procedures (Signed)
Pt arrived to HDU via hospital transport staff at or about 1040 and was assessed prior to tx and found to have a RUA AVF, which was prepared per p&p and cannulated w 15 ga needles w/o issue; order includes RTD 3 hours on 3251 bath, BFR 400, DFR 600 and attempt to achieve 2 kg fluid removal as tolerated by pt; pt initiated at 1115 w/o issue; pt was rinsed back 30 minutes early r/t clotting venous chamber, he achieved his target of 2 kg fluid removal w/o complication, all blood was rinsed back and needles aseptically de-cannulated and pressure dressings applied; hemostasis achieved w/o prolonged bleeding and pt was stable at the end of his tx and when leaving HDU under the care of transport staff; report called to primary RN

## 2016-12-24 LAB — GLUCOSE, CAPILLARY
GLUCOSE-CAPILLARY: 129 mg/dL — AB (ref 65–99)
GLUCOSE-CAPILLARY: 76 mg/dL (ref 65–99)
GLUCOSE-CAPILLARY: 218 mg/dL — AB (ref 65–99)
Glucose-Capillary: 72 mg/dL (ref 65–99)
Glucose-Capillary: 65 mg/dL (ref 65–99)

## 2016-12-24 MED ORDER — AMLODIPINE BESYLATE 10 MG PO TABS
10.0000 mg | ORAL_TABLET | Freq: Every day | ORAL | Status: DC
  Administered 2016-12-26 – 2016-12-27 (×2): 10 mg via ORAL
  Filled 2016-12-24 (×2): qty 1

## 2016-12-24 MED ORDER — CARVEDILOL 25 MG PO TABS
25.0000 mg | ORAL_TABLET | Freq: Two times a day (BID) | ORAL | Status: DC
  Administered 2016-12-24 – 2016-12-27 (×7): 25 mg via ORAL
  Filled 2016-12-24 (×7): qty 1

## 2016-12-24 NOTE — Progress Notes (Signed)
[facility]  ROUNDING NOTE   Subjective:   Patient seen and examined on hemodialysis. Tolerating treatment well. UF of 2 liters.   Objective:  Vital signs in last 24 hours:  Temp:  [97.5 F (36.4 C)-98.9 F (37.2 C)] 98 F (36.7 C) (09/27 0549) Pulse Rate:  [82-98] 84 (09/27 0549) Resp:  [20-28] 22 (09/27 0549) BP: (157-210)/(61-113) 184/66 (09/27 0814) SpO2:  [86 %-100 %] 91 % (09/27 0814)  Weight change:  Filed Weights   12/22/16 1433 12/22/16 2021  Weight: 54.4 kg (120 lb) 57.7 kg (127 lb 1.6 oz)    Intake/Output: I/O last 3 completed shifts: In: 270 [P.O.:270] Out: 0    Intake/Output this shift:  No intake/output data recorded.  Physical Exam: General: NAD, laying in bed  Head: Normocephalic, atraumatic. Moist oral mucosal membranes  Eyes: +blind  Neck: Supple, trachea midline  Lungs:  Crackles bilaterally  Heart: Regular rate and rhythm  Abdomen:  Soft, nontender,   Extremities: No peripheral edema.  Neurologic: Nonfocal, moving all four extremities  Skin: No lesions  Access: Right AVF    Basic Metabolic Panel:  Recent Labs Lab 12/22/16 1443 12/23/16 0303  NA 141 142  K 3.6 4.0  CL 93* 96*  CO2 32 30  GLUCOSE 145* 175*  BUN 36* 43*  CREATININE 5.33* 6.05*  CALCIUM 6.9* 6.6*    Liver Function Tests: No results for input(s): AST, ALT, ALKPHOS, BILITOT, PROT, ALBUMIN in the last 168 hours. No results for input(s): LIPASE, AMYLASE in the last 168 hours. No results for input(s): AMMONIA in the last 168 hours.  CBC:  Recent Labs Lab 12/22/16 1443  WBC 4.7  HGB 12.2*  HCT 39.0*  MCV 66.4*  PLT 244    Cardiac Enzymes:  Recent Labs Lab 12/22/16 1443 12/22/16 2036 12/23/16 0303 12/23/16 0926  TROPONINI 0.11* 0.10* 0.09* 0.08*    BNP: Invalid input(s): POCBNP  CBG:  Recent Labs Lab 12/23/16 0754 12/23/16 1446 12/23/16 1703 12/23/16 2133 12/24/16 0730  GLUCAP 101* 118* 124* 90 7    Microbiology: Results  for orders placed or performed during the hospital encounter of 12/22/16  Blood culture (routine x 2)     Status: None (Preliminary result)   Collection Time: 12/22/16  4:17 PM  Result Value Ref Range Status   Specimen Description BLOOD BLOOD LEFT FOREARM  Final   Special Requests   Final    BOTTLES DRAWN AEROBIC AND ANAEROBIC Blood Culture results may not be optimal due to an excessive volume of blood received in culture bottles   Culture NO GROWTH 2 DAYS  Final   Report Status PENDING  Incomplete  Blood culture (routine x 2)     Status: None (Preliminary result)   Collection Time: 12/22/16  4:23 PM  Result Value Ref Range Status   Specimen Description BLOOD BLOOD LEFT HAND  Final   Special Requests   Final    BOTTLES DRAWN AEROBIC AND ANAEROBIC Blood Culture adequate volume   Culture NO GROWTH 2 DAYS  Final   Report Status PENDING  Incomplete    Coagulation Studies: No results for input(s): LABPROT, INR in the last 72 hours.  Urinalysis: No results for input(s): COLORURINE, LABSPEC, PHURINE, GLUCOSEU, HGBUR, BILIRUBINUR, KETONESUR, PROTEINUR, UROBILINOGEN, NITRITE, LEUKOCYTESUR in the last 72 hours.  Invalid input(s): APPERANCEUR    Imaging: Dg Chest 2 View  Result Date: 12/22/2016 CLINICAL DATA:  Chest pain since last night EXAM: CHEST  2 VIEW COMPARISON:  March 09, 2016  FINDINGS: The mediastinal contour is normal. Heart size is enlarged. There is increased pulmonary interstitium bilaterally. Patchy consolidation of left lung base is noted. Bilateral pleural effusions are identified. The bony structures are stable. IMPRESSION: Congestive heart failure. Patchy consolidation of left lung base with left pleural effusion, underlying pneumonia is not excluded. Electronically Signed   By: Abelardo Diesel M.D.   On: 12/22/2016 15:19   Portable Chest 1 View  Result Date: 12/23/2016 CLINICAL DATA:  Clinical suspicion of pneumonia. Dialysis dependent renal failure. EXAM: PORTABLE CHEST 1  VIEW COMPARISON:  Chest x-ray dated December 23, 2016 FINDINGS: There is a moderate size left pleural effusion. There is confluent density laterally in the left mid lung likely reflecting pneumonia. There is a small right pleural effusion. There is patchy increased density in the right mid and lower lung. The interstitial markings are increased bilaterally. The heart is enlarged and the pulmonary vascularity engorged. There is calcification in the wall of the aortic arch. The observed bony thorax exhibits no acute abnormality. IMPRESSION: Patchy alveolar infiltrates bilaterally worrisome for pneumonia. There is underlying CHF with interstitial edema and moderate-sized left pleural effusion. Thoracic aortic atherosclerosis. Electronically Signed   By: David  Martinique M.D.   On: 12/23/2016 10:22     Medications:   . levofloxacin (LEVAQUIN) IV     . amLODipine  7.5 mg Oral Daily  . aspirin EC  81 mg Oral Daily  . carvedilol  12.5 mg Oral BID WC  . chlorpheniramine-HYDROcodone  5 mL Oral Q12H  . dorzolamide-timolol  1 drop Right Eye BID  . gabapentin  400 mg Oral Daily  . glipiZIDE  2.5 mg Oral QAC breakfast  . heparin  5,000 Units Subcutaneous Q8H  . insulin aspart  0-5 Units Subcutaneous QHS  . insulin aspart  0-9 Units Subcutaneous TID WC  . lisinopril  40 mg Oral Daily  . multivitamin  1 tablet Oral Daily  . polycarbophil  1,250 mg Oral Daily  . sertraline  25 mg Oral Daily  . sevelamer carbonate  800 mg Oral TID WC   acetaminophen **OR** acetaminophen, ondansetron  Assessment/ Plan:  [name] is a 82 y.o. black male end stage renal disease on hemodialysis, hypertension, peripheral vascular disease, left AKA, MGUS, glaucoma, diabetes mellitus type II, congestive heart failure, blindness  MWF [facility] [place].   1. End Stage Renal Disease: on hemodialysis. MWF. Tolerated hemodialysis treatment yesterday.   2. Hypertension: elevated.  - amlodipine, carvedilol,  lisinopril.   3. Anemia of chronic kidney disease: hemoglobin 12.2.  - EPO as outpatient.   4. Secondary Hyperparathyroidism: with hypocalcemia - sevelamer with meals.   5. Diabetes mellitus type II with chronic kidney disease:  - continue glucose control.    LOS: 0 [name] 9/27/201810:06 AM

## 2016-12-24 NOTE — Progress Notes (Signed)
Hypoglycemic Event  CBG: 65  Treatment: 4oz apple juice  Symptoms: none  Follow-up CBG: Time:1714 CBG Result:76  Possible Reasons for Event: none  Comments/MD notified:    Haynes Dage

## 2016-12-24 NOTE — Care Management (Signed)
[facility] is followed by [facility].  Chronic HD at [place]. Lives alone but has [facility] home care four times a day.  [facility] transports to dialysis.  has a walker and wheelchair.  has a stepdaughter that occasionally checks on patient. [facility] will transport patient home upon discharge. Notified Elvera Bicker with [facility] [employer]

## 2016-12-24 NOTE — Progress Notes (Signed)
[place] at [place] NAME: [name]    MR#:  [id number]  DATE OF BIRTH:  [birth year]  SUBJECTIVE:  CHIEF COMPLAINT:  Patient is resting comfortably. Denies no shortness of breath while resting, BP is elevated, denies any headache or blurry vision  REVIEW OF SYSTEMS:  CONSTITUTIONAL: No fever, fatigue or weakness.  EYES: No blurred or double vision.  EARS, NOSE, AND THROAT: No tinnitus or ear pain.  RESPIRATORY: No cough, shortness of breath, wheezing or hemoptysis.  CARDIOVASCULAR: No chest pain, orthopnea, edema.  GASTROINTESTINAL: No nausea, vomiting, diarrhea or abdominal pain.  GENITOURINARY: No dysuria, hematuria.  ENDOCRINE: No polyuria, nocturia,  HEMATOLOGY: No anemia, easy bruising or bleeding SKIN: No rash or lesion. MUSCULOSKELETAL: No joint pain or arthritis.   NEUROLOGIC: No tingling, numbness, weakness.  PSYCHIATRY: No anxiety or depression.   DRUG ALLERGIES:  No Known Allergies  VITALS:  Blood pressure (!) 178/62, pulse 80, temperature 98.1 F (36.7 C), temperature source Oral, resp. rate (!) 22, height 5\' 6"  (1.676 m), weight 57.7 kg (127 lb 1.6 oz), SpO2 (!) 89 %.  PHYSICAL EXAMINATION:  GENERAL:  82 y.o.-year-old patient lying in the bed with no acute distress.  EYES: Pupils equal, round, reactive to light and accommodation. No scleral icterus. Extraocular muscles intact.  HEENT: Head atraumatic, normocephalic. Oropharynx and nasopharynx clear.  NECK:  Supple, no jugular venous distention. No thyroid enlargement, no tenderness.  LUNGS: Normal breath sounds bilaterally, no wheezing, rales,rhonchi.positive crackles No use of accessory muscles of respiration.  CARDIOVASCULAR: S1, S2 normal. No murmurs, rubs, or gallops.  ABDOMEN: Soft, nontender, nondistended. Bowel sounds present. No organomegaly or mass.  EXTREMITIES: No pedal edema, cyanosis, or clubbing.  NEUROLOGIC: Cranial nerves II through XII are  intact. Muscle strength 5/5 in all extremities. Sensation intact. Gait not checked.  PSYCHIATRIC: The patient is alert and oriented x 3.  SKIN: No obvious rash, lesion, or ulcer.    LABORATORY PANEL:   CBC  Recent Labs Lab 12/22/16 1443  WBC 4.7  HGB 12.2*  HCT 39.0*  PLT 244   ------------------------------------------------------------------------------------------------------------------  Chemistries   Recent Labs Lab 12/23/16 0303  NA 142  K 4.0  CL 96*  CO2 30  GLUCOSE 175*  BUN 43*  CREATININE 6.05*  CALCIUM 6.6*   ------------------------------------------------------------------------------------------------------------------  Cardiac Enzymes  Recent Labs Lab 12/23/16 0926  TROPONINI 0.08*   ------------------------------------------------------------------------------------------------------------------  RADIOLOGY:  Portable Chest 1 View  Result Date: 12/23/2016 CLINICAL DATA:  Clinical suspicion of pneumonia. Dialysis dependent renal failure. EXAM: PORTABLE CHEST 1 VIEW COMPARISON:  Chest x-ray dated December 23, 2016 FINDINGS: There is a moderate size left pleural effusion. There is confluent density laterally in the left mid lung likely reflecting pneumonia. There is a small right pleural effusion. There is patchy increased density in the right mid and lower lung. The interstitial markings are increased bilaterally. The heart is enlarged and the pulmonary vascularity engorged. There is calcification in the wall of the aortic arch. The observed bony thorax exhibits no acute abnormality. IMPRESSION: Patchy alveolar infiltrates bilaterally worrisome for pneumonia. There is underlying CHF with interstitial edema and moderate-sized left pleural effusion. Thoracic aortic atherosclerosis. Electronically Signed   By: David  Martinique M.D.   On: 12/23/2016 10:22    EKG:   Orders placed or performed during the hospital encounter of 12/22/16  . ED EKG within 10  minutes  . ED EKG within 10 minutes  . EKG 12-Lead  . EKG 12-Lead  ASSESSMENT AND PLAN:    #  Congestive heart failure.  secondary to fluid overload. For hemodialysis today He had an echocardiogram done within the last year that showed an EF of 50% so no further cardiac workup is necessary at this time.   # pneumonia Repeat chest x-ray with infiltrates Patient has received levofloxacin in the ED will continue the same   #malignant hypertension  increase amlodipine and Coreg and titrate as needed.continue home medication lisinopril  #. End-stage renal disease.  For hemodialysis  Yesterday, continue Monday Wednesday and Friday dialysis Nephrology is following   # Elevated troponin.Suspect this is from his end-stage renal disease and strain from a CHF.   5. Diabetes mellitus sliding scale insulin  PT consult  All the records are reviewed and case discussed with Care Management/Social Workerr. Management plans discussed with the patient, family and they are in agreement.  CODE STATUS: fc   TOTAL TIME TAKING CARE OF THIS PATIENT: 36  minutes.   POSSIBLE D/C IN  am  DAYS, DEPENDING ON CLINICAL CONDITION.  Note: This dictation was prepared with Dragon dictation along with smaller phrase technology. Any transcriptional errors that result from this process are unintentional.   Nicholes Mango M.D on 12/24/2016 at 4:08 PM  Between 7am to 6pm - Pager - [phone] After 6pm go to [email or web address] - password [email or web address]  [place] [facility]  Office  [phone]  CC: Primary care physician; Rinaldo Cloud, MD

## 2016-12-25 ENCOUNTER — Observation Stay: Payer: Medicare (Managed Care)

## 2016-12-25 LAB — CBC
HEMATOCRIT: 31.2 % — AB (ref 40.0–52.0)
Hemoglobin: 10.3 g/dL — ABNORMAL LOW (ref 13.0–18.0)
MCH: 21.7 pg — ABNORMAL LOW (ref 26.0–34.0)
MCHC: 32.8 g/dL (ref 32.0–36.0)
MCV: 65.9 fL — ABNORMAL LOW (ref 80.0–100.0)
PLATELETS: 228 10*3/uL (ref 150–440)
RBC: 4.74 MIL/uL (ref 4.40–5.90)
RDW: 20.9 % — ABNORMAL HIGH (ref 11.5–14.5)
WBC: 7.7 10*3/uL (ref 3.8–10.6)

## 2016-12-25 LAB — RENAL FUNCTION PANEL
Albumin: 3.3 g/dL — ABNORMAL LOW (ref 3.5–5.0)
Anion gap: 16 — ABNORMAL HIGH (ref 5–15)
BUN: 65 mg/dL — ABNORMAL HIGH (ref 6–20)
CHLORIDE: 95 mmol/L — AB (ref 101–111)
CO2: 27 mmol/L (ref 22–32)
CREATININE: 7.53 mg/dL — AB (ref 0.61–1.24)
Calcium: 6.9 mg/dL — ABNORMAL LOW (ref 8.9–10.3)
GFR, EST AFRICAN AMERICAN: 7 mL/min — AB (ref >60)
GFR, EST NON AFRICAN AMERICAN: 6 mL/min — AB (ref >60)
Glucose, Bld: 49 mg/dL — ABNORMAL LOW (ref 65–99)
PHOSPHORUS: 5.4 mg/dL — AB (ref 2.5–4.6)
POTASSIUM: 4.4 mmol/L (ref 3.5–5.1)
Sodium: 138 mmol/L (ref 135–145)

## 2016-12-25 LAB — GLUCOSE, CAPILLARY
GLUCOSE-CAPILLARY: 126 mg/dL — AB (ref 65–99)
Glucose-Capillary: 163 mg/dL — ABNORMAL HIGH (ref 65–99)
Glucose-Capillary: 92 mg/dL (ref 65–99)

## 2016-12-25 MED ORDER — EPOETIN ALFA 10000 UNIT/ML IJ SOLN
4000.0000 [IU] | INTRAMUSCULAR | Status: DC
  Administered 2016-12-25 – 2016-12-28 (×2): 4000 [IU] via INTRAVENOUS
  Filled 2016-12-25: qty 0.4

## 2016-12-25 NOTE — Progress Notes (Signed)
Pharmacy Antibiotic Note  [name] is a 82 y.o. male admitted on 12/22/2016 with CAP.  Pharmacy has been consulted for levofloxacin dosing.  Plan: Patient was given levofloxacin 750 mg IV x 1 in ED on 12/22/16. He is a dialysis patient so dosing should be 750 mg IV x 1 followed by 500 mg IV Q48H independent of HD. Will continue levofloxacin 500 mg IV Q48H starting tomorrow.   Height: 5\' 6"  (167.6 cm) Weight: 121 lb 11.1 oz (55.2 kg) IBW/kg (Calculated) : 63.8  Temp (24hrs), Avg:98.5 F (36.9 C), Min:98.3 F (36.8 C), Max:98.7 F (37.1 C)   Recent Labs Lab 12/22/16 1443 12/23/16 0303 12/25/16 1030  WBC 4.7  --  7.7  CREATININE 5.33* 6.05* 7.53*    Estimated Creatinine Clearance: 6.1 mL/min (A) (by C-G formula based on SCr of 7.53 mg/dL (H)).    No Known Allergies  Thank you for allowing pharmacy to be a part of this patient's care.  Atari Novick, Pharm.D., BCPS Clinical Pharmacist 12/25/2016 1:11 PM

## 2016-12-25 NOTE — Progress Notes (Signed)
This note also relates to the following rows which could not be included: Pulse Rate - Cannot attach notes to unvalidated device data Resp - Cannot attach notes to unvalidated device data BP - Cannot attach notes to unvalidated device data  HD STARTED  

## 2016-12-25 NOTE — Evaluation (Signed)
Physical Therapy Evaluation Patient Details Name: [name] MRN: [id number] DOB: December 11, 1934 Today's Date: 12/25/2016   History of Present Illness  82 year old male who has history of end-stage renal disease. He gets dialysis on Monday, Wednesday and Friday.  He had been up coughing and is admitted with CHF.  Clinical Impression  Pt was able to move fairly well with bed mobility but is O2 sats were significantly low and he showed some fatigue.  He was not overly short of breath, but even with 4 liters (no O2 at baseline) his sats were dropping into the 70s and after being on 6 liters (per nursing) for a few minutes he still had only increased to the mid 80s.  Further mobility to the w/c or attempt at transfers were deferred.  Pt is not safe to return home, STR recommended at this time.   Follow Up Recommendations SNF    Equipment Recommendations       Recommendations for Other Services       Precautions / Restrictions Precautions Precautions: Fall Restrictions Other Position/Activity Restrictions: old L AKA      Mobility  Bed Mobility Overal bed mobility: Modified Independent             General bed mobility comments: Pt able to get up to sitting and laying back in supine w/o assist  Transfers                 General transfer comment: deferred further mobility after getting to sitting as O2 was in the 70s on 4 liters, nursing notified and bumped up to 6 liters  Ambulation/Gait                Stairs            Wheelchair Mobility    Modified Rankin (Stroke Patients Only)       Balance Overall balance assessment: Modified Independent (good balance in sitting, reports he has not stood recently)                                           Pertinent Vitals/Pain Pain Assessment: No/denies pain    Home Living Family/patient expects to be discharged to:: Private residence Living Arrangements:  (reports he lives alone but family  is there many hours QD) Available Help at Discharge: Family   Home Access: Ramped entrance       Home Equipment: Environmental consultant - 2 wheels;Wheelchair - manual      Prior Function Level of Independence: Needs assistance   Gait / Transfers Assistance Needed: reports he has not used walker recently, typically is w/c bound     Comments: Pt is out of the house multiple times a week for dialysis     Hand Dominance        Extremity/Trunk Assessment   Upper Extremity Assessment Upper Extremity Assessment: Overall WFL for tasks assessed    Lower Extremity Assessment Lower Extremity Assessment:  (R LE grossly functional)       Communication   Communication: No difficulties (pt is blind)  Cognition Arousal/Alertness: Awake/alert Behavior During Therapy: WFL for tasks assessed/performed Overall Cognitive Status: Within Functional Limits for tasks assessed  General Comments      Exercises     Assessment/Plan    PT Assessment Patient needs continued PT services  PT Problem List Decreased strength;Decreased range of motion;Decreased activity tolerance;Decreased balance;Decreased knowledge of use of DME;Decreased safety awareness;Decreased mobility;Cardiopulmonary status limiting activity       PT Treatment Interventions DME instruction;Gait training;Functional mobility training;Therapeutic activities;Therapeutic exercise;Balance training;Patient/family education    PT Goals (Current goals can be found in the Care Plan section)  Acute Rehab PT Goals Patient Stated Goal: get stronger and go back home PT Goal Formulation: With patient Time For Goal Achievement: 01/08/17 Potential to Achieve Goals: Good    Frequency Min 2X/week   Barriers to discharge        Co-evaluation               AM-PAC PT "6 Clicks" Daily Activity  Outcome Measure Difficulty turning over in bed (including adjusting bedclothes, sheets and  blankets)?: None Difficulty moving from lying on back to sitting on the side of the bed? : None Difficulty sitting down on and standing up from a chair with arms (e.g., wheelchair, bedside commode, etc,.)?: Unable Help needed moving to and from a bed to chair (including a wheelchair)?: Total Help needed walking in hospital room?: Total Help needed climbing 3-5 steps with a railing? : Total 6 Click Score: 12    End of Session Equipment Utilized During Treatment: Oxygen (4 then 6 liters) Activity Tolerance: Patient limited by fatigue Patient left: with bed alarm set;with call bell/phone within reach   PT Visit Diagnosis: Muscle weakness (generalized) (M62.81);Difficulty in walking, not elsewhere classified (R26.2)    Time: 7425-9563 PT Time Calculation (min) (ACUTE ONLY): 18 min   Charges:   PT Evaluation $PT Eval Low Complexity: 1 Low     PT G Codes:   PT G-Codes **NOT FOR INPATIENT CLASS** Functional Assessment Tool Used: AM-PAC 6 Clicks Basic Mobility Functional Limitation: Mobility: Walking and moving around Mobility: Walking and Moving Around Current Status (O7564): At least 60 percent but less than 80 percent impaired, limited or restricted Mobility: Walking and Moving Around Goal Status [phone]): At least 20 percent but less than 40 percent impaired, limited or restricted    Kreg Shropshire, DPT 12/25/2016, 9:37 AM

## 2016-12-25 NOTE — Plan of Care (Signed)
Problem: Physical Regulation: Goal: Ability to maintain clinical measurements within normal limits will improve Outcome: Progressing pts 02 sats dropped this am prior to dialysis but have been stable and improving post dialysis  Goal: Will remain free from infection Outcome: Progressing Pt on IV antibiotics

## 2016-12-25 NOTE — Progress Notes (Signed)
[facility]  ROUNDING NOTE   Subjective:   Patient seen and examined on hemodialysis. Tolerating treatment well. UF of 3 liters.   Objective:  Vital signs in last 24 hours:  Temp:  [98.1 F (36.7 C)-98.7 F (37.1 C)] 98.7 F (37.1 C) (09/28 1030) Pulse Rate:  [78-83] 78 (09/28 1045) Resp:  [18-24] 24 (09/28 1045) BP: (126-178)/(60-78) 134/60 (09/28 1045) SpO2:  [88 %-92 %] 92 % (09/28 0910) Weight:  [55.2 kg (121 lb 11.1 oz)] 55.2 kg (121 lb 11.1 oz) (09/28 1030)  Weight change:  Filed Weights   12/22/16 1433 12/22/16 2021 12/25/16 1030  Weight: 54.4 kg (120 lb) 57.7 kg (127 lb 1.6 oz) 55.2 kg (121 lb 11.1 oz)    Intake/Output: I/O last 3 completed shifts: In: 62 [P.O.:720; IV Piggyback:100] Out: 0    Intake/Output this shift:  Total I/O In: 120 [P.O.:120] Out: -   Physical Exam: General: NAD, laying in bed  Head: Normocephalic, atraumatic. Moist oral mucosal membranes  Eyes: +blind  Neck: Supple, trachea midline  Lungs:  Crackles bilaterally, diminished left   Heart: Regular rate and rhythm  Abdomen:  Soft, nontender,   Extremities: No peripheral edema.  Neurologic: Nonfocal, moving all four extremities  Skin: No lesions  Access: Right AVF    Basic Metabolic Panel:  Recent Labs Lab 12/22/16 1443 12/23/16 0303  NA 141 142  K 3.6 4.0  CL 93* 96*  CO2 32 30  GLUCOSE 145* 175*  BUN 36* 43*  CREATININE 5.33* 6.05*  CALCIUM 6.9* 6.6*    Liver Function Tests: No results for input(s): AST, ALT, ALKPHOS, BILITOT, PROT, ALBUMIN in the last 168 hours. No results for input(s): LIPASE, AMYLASE in the last 168 hours. No results for input(s): AMMONIA in the last 168 hours.  CBC:  Recent Labs Lab 12/22/16 1443 12/25/16 1030  WBC 4.7 7.7  HGB 12.2* 10.3*  HCT 39.0* 31.2*  MCV 66.4* 65.9*  PLT 244 228    Cardiac Enzymes:  Recent Labs Lab 12/22/16 1443 12/22/16 2036 12/23/16 0303 12/23/16 0926  TROPONINI 0.11* 0.10* 0.09* 0.08*     BNP: Invalid input(s): POCBNP  CBG:  Recent Labs Lab 12/24/16 1129 12/24/16 1637 12/24/16 1715 12/24/16 2123 12/25/16 0725  GLUCAP 129* 65 76 218* 163*    Microbiology: Results for orders placed or performed during the hospital encounter of 12/22/16  Blood culture (routine x 2)     Status: None (Preliminary result)   Collection Time: 12/22/16  4:17 PM  Result Value Ref Range Status   Specimen Description BLOOD BLOOD LEFT FOREARM  Final   Special Requests   Final    BOTTLES DRAWN AEROBIC AND ANAEROBIC Blood Culture results may not be optimal due to an excessive volume of blood received in culture bottles   Culture NO GROWTH 3 DAYS  Final   Report Status PENDING  Incomplete  Blood culture (routine x 2)     Status: None (Preliminary result)   Collection Time: 12/22/16  4:23 PM  Result Value Ref Range Status   Specimen Description BLOOD BLOOD LEFT HAND  Final   Special Requests   Final    BOTTLES DRAWN AEROBIC AND ANAEROBIC Blood Culture adequate volume   Culture NO GROWTH 3 DAYS  Final   Report Status PENDING  Incomplete    Coagulation Studies: No results for input(s): LABPROT, INR in the last 72 hours.  Urinalysis: No results for input(s): COLORURINE, LABSPEC, PHURINE, GLUCOSEU, HGBUR, BILIRUBINUR, KETONESUR, PROTEINUR, UROBILINOGEN, NITRITE,  LEUKOCYTESUR in the last 72 hours.  Invalid input(s): APPERANCEUR    Imaging: Dg Chest 1 View  Result Date: 12/25/2016 CLINICAL DATA:  Shortness of Breath EXAM: CHEST 1 VIEW COMPARISON:  12/23/2016 FINDINGS: Cardiac shadow is mildly prominent. Aortic calcifications are again seen. Increasing left-sided pleural effusion is noted with likely underlying atelectasis/ infiltrate. Patchy interstitial changes are again identified bilaterally slightly worsened in the right upper lobe. IMPRESSION: Increasing changes of congestive failure. Increasing left pleural effusion and underlying atelectatic changes. Electronically Signed   By:  Inez Catalina M.D.   On: 12/25/2016 09:59     Medications:   . levofloxacin (LEVAQUIN) IV Stopped (12/24/16 1147)   . amLODipine  10 mg Oral Daily  . aspirin EC  81 mg Oral Daily  . carvedilol  25 mg Oral BID WC  . chlorpheniramine-HYDROcodone  5 mL Oral Q12H  . dorzolamide-timolol  1 drop Right Eye BID  . gabapentin  400 mg Oral Daily  . glipiZIDE  2.5 mg Oral QAC breakfast  . heparin  5,000 Units Subcutaneous Q8H  . insulin aspart  0-5 Units Subcutaneous QHS  . insulin aspart  0-9 Units Subcutaneous TID WC  . lisinopril  40 mg Oral Daily  . multivitamin  1 tablet Oral Daily  . polycarbophil  1,250 mg Oral Daily  . sertraline  25 mg Oral Daily  . sevelamer carbonate  800 mg Oral TID WC   acetaminophen **OR** acetaminophen, ondansetron  Assessment/ Plan:  [name] is a 82 y.o. black male end stage renal disease on hemodialysis, hypertension, peripheral vascular disease, left AKA, MGUS, glaucoma, diabetes mellitus type II, congestive heart failure, blindness  MWF [facility] [place].   1. End Stage Renal Disease: on hemodialysis. MWF. Seen and examined on hemodialysis. Tolerating treatment well. UF goal of 3 liters Monitor daily for dialysis need.   2. Hypertension: blood pressure at goal.  - amlodipine, carvedilol, lisinopril.   3. Anemia of chronic kidney disease: hemoglobin 10.3 - EPO 4000  4. Secondary Hyperparathyroidism: with hypocalcemia - sevelamer with meals.   5. Diabetes mellitus type II with chronic kidney disease:  - continue glucose control.    LOS: 0 [name] 9/28/201811:02 AM

## 2016-12-25 NOTE — Progress Notes (Signed)
Pt and PCP prefer to update his cide status. He is currently a DNR and wishes to transition to a partial code. We have supporting documentation that was signed by the patient and the MD stating his new wishes. We have updated the orders and notified the on call MD.

## 2016-12-25 NOTE — Progress Notes (Signed)
POST DIALYSIS ASSESSMENT 

## 2016-12-25 NOTE — Clinical Social Work Note (Signed)
Clinical Social Work Assessment  Patient Details  Name: [name] MRN: [id number] Date of Birth: 1935-01-26  Date of referral:  12/25/16               Reason for consult:  Discharge Planning                Permission sought to share information with:  Facility Sport and exercise psychologist (PACE provider) Permission granted to share information::     Name::        Agency::     Relationship::     Contact Information:     Housing/Transportation Living arrangements for the past 2 months:  Single Family Home Source of Information:  Patient, Other (Comment Required) (PACE) Patient Interpreter Needed:  None Criminal Activity/Legal Involvement Pertinent to Current Situation/Hospitalization:  No - Comment as needed Significant Relationships:   (stepdaughter) Lives with:  Self Do you feel safe going back to the place where you live?  Yes Need for family participation in patient care:  Yes (Comment)  Care giving concerns:  Patient is disabled and lives alone.   Social Worker assessment / plan:  CSW reviewed PT assessment and because they could not work with patient due to his oxygen saturations dropped into the 70's they have recommended STR. CSW spoke with patient and explained the role and purpose of visit. Patient informed CSW that he would prefer he return home if he could. Patient agreed to allow CSW to contact his PACE program. CSW spoke with [facility] and she stated that she would take the information I was able to provide to the rest of the PACE team and they would let me know if they were going to tell patient he would have to go to rehab or not. CSW has confirmed existing pasrr and has completed FL2 and sent to participating in network facilities with PACE: [facility] and [facility]. Miley stated she would let CSW know what their discharge plan for patient will be as PACE drives the discharge plan. She stated that they may not be able to decide on plan until Monday.  Employment  status:  Disabled (Comment on whether or not currently receiving Disability) Insurance information:  Managed Medicare PT Recommendations:  [place] / Referral to community resources:     Patient/Family's Response to care:  Patient expressed appreciation for CSW visit.  Patient/Family's Understanding of and Emotional Response to Diagnosis, Current Treatment, and Prognosis:  Patient is hopeful he can return home but due to his physical condition, he may not be able to.   Emotional Assessment Appearance:  Appears stated age Attitude/Demeanor/Rapport:   (pleasant and cooperative) Affect (typically observed):  Calm, Pleasant, Quiet Orientation:  Oriented to Self, Oriented to Place, Oriented to  Time, Oriented to Situation Alcohol / Substance use:  Not Applicable Psych involvement (Current and /or in the community):  No (Comment)  Discharge Needs  Concerns to be addressed:  Care Coordination Readmission within the last 30 days:  No Current discharge risk:  None Barriers to Discharge:  No Barriers Identified   Shela Leff, LCSW 12/25/2016, 3:41 PM

## 2016-12-25 NOTE — Progress Notes (Signed)
[facility] [place] NAME: [name]    MR#:  [id number]  DATE OF BIRTH:  1934-05-06  SUBJECTIVE:  CHIEF COMPLAINT:  Patient is hypoxic today   REVIEW OF SYSTEMS:  CONSTITUTIONAL: No fever, fatigue or weakness.  EYES: No blurred or double vision.  EARS, NOSE, AND THROAT: No tinnitus or ear pain.  RESPIRATORY: No cough, reports exertionalshortness of breath, no wheezing or hemoptysis.  CARDIOVASCULAR: No chest pain, orthopnea, edema.  GASTROINTESTINAL: No nausea, vomiting, diarrhea or abdominal pain.  GENITOURINARY: No dysuria, hematuria.  ENDOCRINE: No polyuria, nocturia,  HEMATOLOGY: No anemia, easy bruising or bleeding SKIN: No rash or lesion. MUSCULOSKELETAL: No joint pain or arthritis.   NEUROLOGIC: No tingling, numbness, weakness.  PSYCHIATRY: No anxiety or depression.   DRUG ALLERGIES:  No Known Allergies  VITALS:  Blood pressure (!) 137/49, pulse 78, temperature 98 F (36.7 C), temperature source Oral, resp. rate (!) 21, height 5\' 6"  (1.676 m), weight 51.4 kg (113 lb 5.1 oz), SpO2 95 %.  PHYSICAL EXAMINATION:  GENERAL:  82 y.o.-year-old patient lying in the bed with no acute distress.  EYES: Pupils equal, round, reactive to light and accommodation. No scleral icterus. Extraocular muscles intact.  HEENT: Head atraumatic, normocephalic. Oropharynx and nasopharynx clear.  NECK:  Supple, no jugular venous distention. No thyroid enlargement, no tenderness.  LUNGS: Normal breath sounds bilaterally, no wheezing, rales,rhonchi.positive crackles No use of accessory muscles of respiration.  CARDIOVASCULAR: S1, S2 normal. No murmurs, rubs, or gallops.  ABDOMEN: Soft, nontender, nondistended. Bowel sounds present. No organomegaly or mass.  EXTREMITIES: No pedal edema, cyanosis, or clubbing.  NEUROLOGIC: Cranial nerves II through XII are intact. Muscle strength 5/5 in all extremities. Sensation intact. Gait not checked.   PSYCHIATRIC: The patient is alert and oriented x 3.  SKIN: No obvious rash, lesion, or ulcer.    LABORATORY PANEL:   CBC  Recent Labs Lab 12/25/16 1030  WBC 7.7  HGB 10.3*  HCT 31.2*  PLT 228   ------------------------------------------------------------------------------------------------------------------  Chemistries   Recent Labs Lab 12/25/16 1030  NA 138  K 4.4  CL 95*  CO2 27  GLUCOSE 49*  BUN 65*  CREATININE 7.53*  CALCIUM 6.9*   ------------------------------------------------------------------------------------------------------------------  Cardiac Enzymes  Recent Labs Lab 12/23/16 0926  TROPONINI 0.08*   ------------------------------------------------------------------------------------------------------------------  RADIOLOGY:  Dg Chest 1 View  Result Date: 12/25/2016 CLINICAL DATA:  Shortness of Breath EXAM: CHEST 1 VIEW COMPARISON:  12/23/2016 FINDINGS: Cardiac shadow is mildly prominent. Aortic calcifications are again seen. Increasing left-sided pleural effusion is noted with likely underlying atelectasis/ infiltrate. Patchy interstitial changes are again identified bilaterally slightly worsened in the right upper lobe. IMPRESSION: Increasing changes of congestive failure. Increasing left pleural effusion and underlying atelectatic changes. Electronically Signed   By: Inez Catalina M.D.   On: 12/25/2016 09:59    EKG:   Orders placed or performed during the hospital encounter of 12/22/16  . ED EKG within 10 minutes  . ED EKG within 10 minutes  . EKG 12-Lead  . EKG 12-Lead    ASSESSMENT AND PLAN:    #  Congestive heart failure.  secondary to fluid overload. For hemodialysis today and possibly tomorrow too  He had an echocardiogram done within the last year that showed an EF of 50% so no further cardiac workup is necessary at this time.   # pneumonia Repeat chest x-ray with  Fluid overload and possible infiltrates Patient has received  levofloxacin in the  ED will continue the same   #malignant hypertension  increase amlodipine and Coreg and titrate as needed.continue home medication lisinopril  #. End-stage renal disease.  For hemodialysis  Yesterday, continue Monday Wednesday and Friday dialysis Nephrology is following   # Elevated troponin.Suspect this is from his end-stage renal disease and strain from a CHF.   5. Diabetes mellitus sliding scale insulin  PT consult  All the records are reviewed and case discussed with Care Management/Social Workerr. Management plans discussed with the patient, family and they are in agreement.  CODE STATUS: fc   TOTAL TIME TAKING CARE OF THIS PATIENT: 36  minutes.   POSSIBLE D/C IN  1-2   DAYS, DEPENDING ON CLINICAL CONDITION.  Note: This dictation was prepared with Dragon dictation along with smaller phrase technology. Any transcriptional errors that result from this process are unintentional.   Nicholes Mango M.D on 12/25/2016 at 4:29 PM  Between 7am to 6pm - Pager - [phone] After 6pm go to [email or web address] - password [email or web address] [facility]  [facility]  Office  [phone]  CC: Primary care physician; Rinaldo Cloud, MD

## 2016-12-25 NOTE — NC FL2 (Signed)
[place] LEVEL OF CARE SCREENING TOOL     IDENTIFICATION  Patient Name: [name] Birthdate: [birth year] Sex: male Admission Date (Current Location): 12/22/2016  [place] and [place] Number:  Engineering geologist and Address:  [facility], [address] [place], [place] [address]      Provider Number: [id number]  Attending Physician Name and Address:  Nicholes Mango, MD  Relative Name and Phone Number:       Current Level of Care: Hospital Recommended Level of Care: [place] Prior Approval Number:    Date Approved/Denied: 12/25/16 PASRR Number: [id number]  Discharge Plan: SNF    Current Diagnoses: Patient Active Problem List   Diagnosis Date Noted  . Congestive heart failure (CHF) ([place]) 12/22/2016  . Acute exacerbation of congestive heart failure ([place]) 03/09/2016  . Pneumonia 11/10/2014  . Hypoxia 11/10/2014  . Fluid overload 11/10/2014  . Elevated troponin 11/10/2014  . Healthcare-associated pneumonia 10/28/2014    Orientation RESPIRATION BLADDER Height & Weight     Self, Time, Situation, Place  O2 (6 liters) Continent Weight: 113 lb 5.1 oz (51.4 kg) Height:  5\' 6"  (167.6 cm)  BEHAVIORAL SYMPTOMS/MOOD NEUROLOGICAL BOWEL NUTRITION STATUS   (none)  (none) Continent Diet  AMBULATORY STATUS COMMUNICATION OF NEEDS Skin   Extensive Assist Verbally Normal                       Personal Care Assistance Level of Assistance  Total care       Total Care Assistance: Maximum assistance   Functional Limitations Info  Sight Sight Info: Impaired (blind)        SPECIAL CARE FACTORS FREQUENCY  PT (By licensed PT) (hemodialysis)                    Contractures Contractures Info: Not present    Additional Factors Info  Code Status Code Status Info: full             Current Medications (12/25/2016):  This is the current hospital active medication list Current Facility-Administered  Medications  Medication Dose Route Frequency Provider Last Rate Last Dose  . acetaminophen (TYLENOL) tablet 650 mg  650 mg Oral Q6H PRN Baxter Hire, MD       Or  . acetaminophen (TYLENOL) suppository 650 mg  650 mg Rectal Q6H PRN Baxter Hire, MD      . amLODipine (NORVASC) tablet 10 mg  10 mg Oral Daily Nicholes Mango, MD   Stopped at 12/25/16 1000  . aspirin EC tablet 81 mg  81 mg Oral Daily Baxter Hire, MD   81 mg at 12/25/16 1008  . carvedilol (COREG) tablet 25 mg  25 mg Oral BID WC Khrystyna Schwalm, MD   25 mg at 12/25/16 0747  . chlorpheniramine-HYDROcodone (TUSSIONEX) 10-8 MG/5ML suspension 5 mL  5 mL Oral Q12H Gladstone Lighter, MD   5 mL at 12/25/16 0747  . dorzolamide-timolol (COSOPT) 22.3-6.8 MG/ML ophthalmic solution 1 drop  1 drop Right Eye BID Baxter Hire, MD   1 drop at 12/25/16 1012  . epoetin alfa (EPOGEN,PROCRIT) injection 4,000 Units  4,000 Units Intravenous Q M,W,F-HD Lavonia Dana, MD   4,000 Units at 12/25/16 1130  . gabapentin (NEURONTIN) capsule 400 mg  400 mg Oral Daily Baxter Hire, MD   400 mg at 12/25/16 1007  . glipiZIDE (GLUCOTROL) tablet 2.5 mg  2.5 mg Oral QAC breakfast Baxter Hire, MD  2.5 mg at 12/25/16 1006  . heparin injection 5,000 Units  5,000 Units Subcutaneous Q8H Baxter Hire, MD   5,000 Units at 12/25/16 [phone]  . insulin aspart (novoLOG) injection 0-5 Units  0-5 Units Subcutaneous QHS Baxter Hire, MD   2 Units at 12/24/16 2223  . insulin aspart (novoLOG) injection 0-9 Units  0-9 Units Subcutaneous TID WC Baxter Hire, MD   3 Units at 12/25/16 [phone]  . levofloxacin (LEVAQUIN) IVPB 500 mg  500 mg Intravenous Q48H Vanderbilt Ranieri, Illene Silver, MD   Stopped at 12/24/16 1147  . lisinopril (PRINIVIL,ZESTRIL) tablet 40 mg  40 mg Oral Daily Baxter Hire, MD   Stopped at 12/25/16 1000  . multivitamin (RENA-VIT) tablet 1 tablet  1 tablet Oral Daily Baxter Hire, MD   1 tablet at 12/25/16 1007  . ondansetron (ZOFRAN-ODT) disintegrating  tablet 4 mg  4 mg Oral Q8H PRN Baxter Hire, MD      . polycarbophil [facility]) tablet 1,250 mg  1,250 mg Oral Daily Baxter Hire, MD   Stopped at 12/25/16 1000  . sertraline (ZOLOFT) tablet 25 mg  25 mg Oral Daily Baxter Hire, MD   25 mg at 12/25/16 1007  . sevelamer carbonate (RENVELA) tablet 800 mg  800 mg Oral TID WC Baxter Hire, MD   800 mg at 12/25/16 7628     Discharge Medications: Please see discharge summary for a list of discharge medications.  Relevant Imaging Results:  Relevant Lab Results:   Additional Information ss: [id number]/PACE recipient  Shela Leff, LCSW

## 2016-12-25 NOTE — Progress Notes (Signed)
HD COMPLETED  

## 2016-12-25 NOTE — Progress Notes (Signed)
PRE DIALYSIS ASSESSMENT 

## 2016-12-26 ENCOUNTER — Encounter: Payer: Self-pay | Admitting: Radiology

## 2016-12-26 ENCOUNTER — Observation Stay: Payer: Medicare (Managed Care)

## 2016-12-26 DIAGNOSIS — R011 Cardiac murmur, unspecified: Secondary | ICD-10-CM | POA: Diagnosis present

## 2016-12-26 DIAGNOSIS — E1140 Type 2 diabetes mellitus with diabetic neuropathy, unspecified: Secondary | ICD-10-CM | POA: Diagnosis present

## 2016-12-26 DIAGNOSIS — N2581 Secondary hyperparathyroidism of renal origin: Secondary | ICD-10-CM | POA: Diagnosis present

## 2016-12-26 DIAGNOSIS — Z8042 Family history of malignant neoplasm of prostate: Secondary | ICD-10-CM | POA: Diagnosis not present

## 2016-12-26 DIAGNOSIS — Z87891 Personal history of nicotine dependence: Secondary | ICD-10-CM | POA: Diagnosis not present

## 2016-12-26 DIAGNOSIS — N186 End stage renal disease: Secondary | ICD-10-CM | POA: Diagnosis present

## 2016-12-26 DIAGNOSIS — J9601 Acute respiratory failure with hypoxia: Secondary | ICD-10-CM | POA: Diagnosis present

## 2016-12-26 DIAGNOSIS — H543 Unqualified visual loss, both eyes: Secondary | ICD-10-CM | POA: Diagnosis present

## 2016-12-26 DIAGNOSIS — E1122 Type 2 diabetes mellitus with diabetic chronic kidney disease: Secondary | ICD-10-CM | POA: Diagnosis present

## 2016-12-26 DIAGNOSIS — I5033 Acute on chronic diastolic (congestive) heart failure: Secondary | ICD-10-CM | POA: Diagnosis present

## 2016-12-26 DIAGNOSIS — Z993 Dependence on wheelchair: Secondary | ICD-10-CM | POA: Diagnosis not present

## 2016-12-26 DIAGNOSIS — Z7982 Long term (current) use of aspirin: Secondary | ICD-10-CM | POA: Diagnosis not present

## 2016-12-26 DIAGNOSIS — I2510 Atherosclerotic heart disease of native coronary artery without angina pectoris: Secondary | ICD-10-CM | POA: Diagnosis present

## 2016-12-26 DIAGNOSIS — Z833 Family history of diabetes mellitus: Secondary | ICD-10-CM | POA: Diagnosis not present

## 2016-12-26 DIAGNOSIS — I132 Hypertensive heart and chronic kidney disease with heart failure and with stage 5 chronic kidney disease, or end stage renal disease: Secondary | ICD-10-CM | POA: Diagnosis present

## 2016-12-26 DIAGNOSIS — Z992 Dependence on renal dialysis: Secondary | ICD-10-CM | POA: Diagnosis not present

## 2016-12-26 DIAGNOSIS — I248 Other forms of acute ischemic heart disease: Secondary | ICD-10-CM | POA: Diagnosis not present

## 2016-12-26 DIAGNOSIS — I5030 Unspecified diastolic (congestive) heart failure: Secondary | ICD-10-CM | POA: Diagnosis not present

## 2016-12-26 DIAGNOSIS — D472 Monoclonal gammopathy: Secondary | ICD-10-CM | POA: Diagnosis present

## 2016-12-26 DIAGNOSIS — H409 Unspecified glaucoma: Secondary | ICD-10-CM | POA: Diagnosis present

## 2016-12-26 DIAGNOSIS — J9621 Acute and chronic respiratory failure with hypoxia: Secondary | ICD-10-CM | POA: Diagnosis present

## 2016-12-26 DIAGNOSIS — R0602 Shortness of breath: Secondary | ICD-10-CM | POA: Diagnosis present

## 2016-12-26 DIAGNOSIS — Z794 Long term (current) use of insulin: Secondary | ICD-10-CM | POA: Diagnosis not present

## 2016-12-26 DIAGNOSIS — J189 Pneumonia, unspecified organism: Secondary | ICD-10-CM | POA: Diagnosis present

## 2016-12-26 DIAGNOSIS — I700 Atherosclerosis of aorta: Secondary | ICD-10-CM | POA: Diagnosis present

## 2016-12-26 DIAGNOSIS — F329 Major depressive disorder, single episode, unspecified: Secondary | ICD-10-CM | POA: Diagnosis present

## 2016-12-26 DIAGNOSIS — J440 Chronic obstructive pulmonary disease with acute lower respiratory infection: Secondary | ICD-10-CM | POA: Diagnosis present

## 2016-12-26 LAB — GLUCOSE, CAPILLARY
GLUCOSE-CAPILLARY: 179 mg/dL — AB (ref 65–99)
GLUCOSE-CAPILLARY: 53 mg/dL — AB (ref 65–99)
GLUCOSE-CAPILLARY: 56 mg/dL — AB (ref 65–99)
GLUCOSE-CAPILLARY: 45 mg/dL — AB (ref 65–99)
GLUCOSE-CAPILLARY: 53 mg/dL — AB (ref 65–99)
Glucose-Capillary: 53 mg/dL — ABNORMAL LOW (ref 65–99)
Glucose-Capillary: 85 mg/dL (ref 65–99)
Glucose-Capillary: 242 mg/dL — ABNORMAL HIGH (ref 65–99)
Glucose-Capillary: 55 mg/dL — ABNORMAL LOW (ref 65–99)

## 2016-12-26 LAB — HEPATITIS B SURFACE ANTIGEN: Hepatitis B Surface Ag: NEGATIVE

## 2016-12-26 MED ORDER — IPRATROPIUM-ALBUTEROL 0.5-2.5 (3) MG/3ML IN SOLN
3.0000 mL | Freq: Four times a day (QID) | RESPIRATORY_TRACT | Status: DC
  Administered 2016-12-26 – 2016-12-28 (×8): 3 mL via RESPIRATORY_TRACT
  Filled 2016-12-26 (×8): qty 3

## 2016-12-26 MED ORDER — LEVOFLOXACIN 500 MG PO TABS: 500.0000 mg | ORAL_TABLET | ORAL | Status: DC

## 2016-12-26 MED ORDER — BUDESONIDE 0.5 MG/2ML IN SUSP
0.5000 mg | Freq: Two times a day (BID) | RESPIRATORY_TRACT | Status: DC
  Administered 2016-12-26 – 2016-12-28 (×5): 0.5 mg via RESPIRATORY_TRACT
  Filled 2016-12-26 (×5): qty 2

## 2016-12-26 MED ORDER — METHYLPREDNISOLONE SODIUM SUCC 40 MG IJ SOLR
40.0000 mg | Freq: Every day | INTRAMUSCULAR | Status: DC
  Administered 2016-12-26: 40 mg via INTRAVENOUS
  Filled 2016-12-26: qty 1

## 2016-12-26 MED ORDER — DEXTROSE-NACL 5-0.9 % IV SOLN
INTRAVENOUS | Status: DC
  Administered 2016-12-26: via INTRAVENOUS

## 2016-12-26 MED ORDER — GLUCOSE 40 % PO GEL
ORAL | Status: AC
  Administered 2016-12-26: 37.5 g
  Filled 2016-12-26: qty 1

## 2016-12-26 MED ORDER — GLUCOSE 4 G PO CHEW
CHEWABLE_TABLET | ORAL | Status: AC
  Administered 2016-12-26: 23:00:00
  Filled 2016-12-26: qty 1

## 2016-12-26 MED ORDER — IOPAMIDOL (ISOVUE-370) INJECTION 76%
75.0000 mL | Freq: Once | INTRAVENOUS | Status: AC | PRN
  Administered 2016-12-26: 75 mL via INTRAVENOUS

## 2016-12-26 MED ORDER — GLUCAGON HCL RDNA (DIAGNOSTIC) 1 MG IJ SOLR
1.0000 mg | Freq: Once | INTRAMUSCULAR | Status: AC
  Administered 2016-12-26: 1 mg via INTRAMUSCULAR
  Filled 2016-12-26: qty 1

## 2016-12-26 NOTE — Progress Notes (Signed)
Pharmacy Antibiotic Note  [name] is a 82 y.o. male admitted on 12/22/2016 with CAP.  Pharmacy has been consulted for levofloxacin dosing.  Plan: Will continue levofloxacin 500mg  PO Q48hr.   Height: 5\' 6"  (167.6 cm) Weight: 113 lb 5.1 oz (51.4 kg) IBW/kg (Calculated) : 63.8  Temp (24hrs), Avg:98.1 F (36.7 C), Min:98 F (36.7 C), Max:98.3 F (36.8 C)   Recent Labs Lab 12/22/16 1443 12/23/16 0303 12/25/16 1030  WBC 4.7  --  7.7  CREATININE 5.33* 6.05* 7.53*    Estimated Creatinine Clearance: 5.7 mL/min (A) (by C-G formula based on SCr of 7.53 mg/dL (H)).    No Known Allergies  Thank you for allowing pharmacy to be a part of this patient's care.  Armanie Martine L 12/26/2016 2:47 PM

## 2016-12-26 NOTE — Progress Notes (Signed)
[facility]  ROUNDING NOTE   Subjective:   Hemodialysis treatment yesterday. Tolerated treatment well. UF of 3 liters.   Continues to have hypoxia and require oxygen. Scheduled for an extra treatment today.   Objective:  Vital signs in last 24 hours:  Temp:  [98 F (36.7 C)-98.3 F (36.8 C)] 98.3 F (36.8 C) (09/29 0429) Pulse Rate:  [71-79] 71 (09/29 0429) Resp:  [16-23] 16 (09/29 0429) BP: (125-150)/(49-62) 130/54 (09/29 0429) SpO2:  [81 %-96 %] 90 % (09/29 1008) Weight:  [51.4 kg (113 lb 5.1 oz)] 51.4 kg (113 lb 5.1 oz) (09/28 1400)  Weight change:  Filed Weights   12/22/16 2021 12/25/16 1030 12/25/16 1400  Weight: 57.7 kg (127 lb 1.6 oz) 55.2 kg (121 lb 11.1 oz) 51.4 kg (113 lb 5.1 oz)    Intake/Output: I/O last 3 completed shifts: In: 240 [P.O.:240] Out: 4200 [Urine:1200; Other:3000]   Intake/Output this shift:  Total I/O In: 240 [P.O.:240] Out: -   Physical Exam: General: NAD, laying in bed  Head: Normocephalic, atraumatic. Moist oral mucosal membranes  Eyes: +blind  Neck: Supple, trachea midline  Lungs:  Crackles bilaterally, diminished left   Heart: Regular rate and rhythm  Abdomen:  Soft, nontender,   Extremities: No peripheral edema.  Neurologic: Nonfocal, moving all four extremities  Skin: No lesions  Access: Right AVF    Basic Metabolic Panel:  Recent Labs Lab 12/22/16 1443 12/23/16 0303 12/25/16 1030  NA 141 142 138  K 3.6 4.0 4.4  CL 93* 96* 95*  CO2 32 30 27  GLUCOSE 145* 175* 49*  BUN 36* 43* 65*  CREATININE 5.33* 6.05* 7.53*  CALCIUM 6.9* 6.6* 6.9*  PHOS  --   --  5.4*    Liver Function Tests:  Recent Labs Lab 12/25/16 1030  ALBUMIN 3.3*   No results for input(s): LIPASE, AMYLASE in the last 168 hours. No results for input(s): AMMONIA in the last 168 hours.  CBC:  Recent Labs Lab 12/22/16 1443 12/25/16 1030  WBC 4.7 7.7  HGB 12.2* 10.3*  HCT 39.0* 31.2*  MCV 66.4* 65.9*  PLT 244 228    Cardiac  Enzymes:  Recent Labs Lab 12/22/16 1443 12/22/16 2036 12/23/16 0303 12/23/16 0926  TROPONINI 0.11* 0.10* 0.09* 0.08*    BNP: Invalid input(s): POCBNP  CBG:  Recent Labs Lab 12/25/16 2105 12/26/16 0735 12/26/16 1138 12/26/16 1150 12/26/16 1207  GLUCAP 126* 179* 53* 53* 85    Microbiology: Results for orders placed or performed during the hospital encounter of 12/22/16  Blood culture (routine x 2)     Status: None (Preliminary result)   Collection Time: 12/22/16  4:17 PM  Result Value Ref Range Status   Specimen Description BLOOD BLOOD LEFT FOREARM  Final   Special Requests   Final    BOTTLES DRAWN AEROBIC AND ANAEROBIC Blood Culture results may not be optimal due to an excessive volume of blood received in culture bottles   Culture NO GROWTH 4 DAYS  Final   Report Status PENDING  Incomplete  Blood culture (routine x 2)     Status: None (Preliminary result)   Collection Time: 12/22/16  4:23 PM  Result Value Ref Range Status   Specimen Description BLOOD BLOOD LEFT HAND  Final   Special Requests   Final    BOTTLES DRAWN AEROBIC AND ANAEROBIC Blood Culture adequate volume   Culture NO GROWTH 4 DAYS  Final   Report Status PENDING  Incomplete    Coagulation  Studies: No results for input(s): LABPROT, INR in the last 72 hours.  Urinalysis: No results for input(s): COLORURINE, LABSPEC, PHURINE, GLUCOSEU, HGBUR, BILIRUBINUR, KETONESUR, PROTEINUR, UROBILINOGEN, NITRITE, LEUKOCYTESUR in the last 72 hours.  Invalid input(s): APPERANCEUR    Imaging: Dg Chest 1 View  Result Date: 12/25/2016 CLINICAL DATA:  Shortness of Breath EXAM: CHEST 1 VIEW COMPARISON:  12/23/2016 FINDINGS: Cardiac shadow is mildly prominent. Aortic calcifications are again seen. Increasing left-sided pleural effusion is noted with likely underlying atelectasis/ infiltrate. Patchy interstitial changes are again identified bilaterally slightly worsened in the right upper lobe. IMPRESSION: Increasing  changes of congestive failure. Increasing left pleural effusion and underlying atelectatic changes. Electronically Signed   By: Inez Catalina M.D.   On: 12/25/2016 09:59   Ct Angio Chest Pe W Or Wo Contrast  Result Date: 12/26/2016 CLINICAL DATA:  Worsening cough. Oxygen requirement since in the hospital. Dialysis for 3 years. History of prostate cancer. EXAM: CT ANGIOGRAPHY CHEST WITH CONTRAST TECHNIQUE: Multidetector CT imaging of the chest was performed using the standard protocol during bolus administration of intravenous contrast. Multiplanar CT image reconstructions and MIPs were obtained to evaluate the vascular anatomy. CONTRAST:  75 cc Isovue 370 COMPARISON:  Chest x-ray 12/25/2016 FINDINGS: Cardiovascular: Pulmonary arteries are well opacified. There is no acute pulmonary embolus. There is dense atherosclerosis of the aortic arch and is branches. Bovine arch anatomy. Significant coronary artery calcification. No significant pericardial effusion. Mediastinum/Nodes: The visualized portion of the thyroid gland has a normal appearance. There are mildly prominent lymph nodes in the superior mediastinum. Pretracheal lymph node is 1.4 cm. Confluent small AP window lymph nodes are identified, largest measuring approximately 1.4 cm. Lungs/Pleura: There are bilateral pleural effusions, left greater than right. Bibasilar atelectasis involves the left lower lobe greater than the right. There is thickening of septal lines primarily involving the dependent aspects of the upper and lower lobes bilaterally. No suspicious pulmonary nodules. Upper Abdomen: Gallbladder is present. There is atherosclerotic calcification of the upper abdominal vessels. Musculoskeletal: Numerous remote right rib fractures. No suspicious osseous lesions. Review of the MIP images confirms the above findings. IMPRESSION: 1. Technically adequate exam showing no acute pulmonary embolus. 2. Cardiomegaly and bilateral pleural effusions associated  with thickening of the septal lines bilaterally, compatible with pulmonary edema. 3.  Aortic atherosclerosis.  (ICD10-I70.0) 4. Bovine arch anatomy. 5. Coronary artery disease. 6. Mildly prominent mediastinal lymph nodes may be reactive. 7. Remote right rib fractures. 8. No evidence for osseous metastatic disease. Electronically Signed   By: Nolon Nations M.D.   On: 12/26/2016 11:10     Medications:   . levofloxacin (LEVAQUIN) IV Stopped (12/26/16 1235)   . amLODipine  10 mg Oral Daily  . aspirin EC  81 mg Oral Daily  . budesonide (PULMICORT) nebulizer solution  0.5 mg Nebulization BID  . carvedilol  25 mg Oral BID WC  . chlorpheniramine-HYDROcodone  5 mL Oral Q12H  . dorzolamide-timolol  1 drop Right Eye BID  . epoetin (EPOGEN/PROCRIT) injection  4,000 Units Intravenous Q M,W,F-HD  . gabapentin  400 mg Oral Daily  . glipiZIDE  2.5 mg Oral QAC breakfast  . heparin  5,000 Units Subcutaneous Q8H  . insulin aspart  0-5 Units Subcutaneous QHS  . insulin aspart  0-9 Units Subcutaneous TID WC  . ipratropium-albuterol  3 mL Nebulization Q6H  . lisinopril  40 mg Oral Daily  . methylPREDNISolone (SOLU-MEDROL) injection  40 mg Intravenous Daily  . multivitamin  1 tablet Oral Daily  .  polycarbophil  1,250 mg Oral Daily  . sertraline  25 mg Oral Daily  . sevelamer carbonate  800 mg Oral TID WC   acetaminophen **OR** acetaminophen, ondansetron  Assessment/ Plan:  [name] is a 82 y.o. black male end stage renal disease on hemodialysis, hypertension, peripheral vascular disease, left AKA, MGUS, glaucoma, diabetes mellitus type II, congestive heart failure, blindness  MWF [facility] [place].   1. End Stage Renal Disease: on hemodialysis. MWF.  Extra treatment for today for more ultrafiltration Monitor daily for dialysis need.   2. Hypertension: blood pressure at goal.  - amlodipine, carvedilol, lisinopril.   3. Anemia of chronic kidney disease: hemoglobin 10.3 - EPO  4000 MWF  4. Secondary Hyperparathyroidism: with hypocalcemia - sevelamer with meals.   5. Diabetes mellitus type II with chronic kidney disease:  - continue glucose control.    LOS: 0 Zong Mcquarrie 9/29/20181:04 PM

## 2016-12-26 NOTE — Progress Notes (Signed)
Patient ID: [name], male   DOB: 14-May-1934, 82 y.o.   MRN: [id number]  [employer] PROGRESS NOTE  [name] BZJ:[id number] DOB: December 24, 1934 DOA: 12/22/2016 PCP: Rinaldo Cloud, MD  HPI/Subjective: Patient does not offer her much in regards to complaints. Little bit of a cough. Some shortness of breath.  Objective: Vitals:   12/26/16 1315 12/26/16 1351  BP:    Pulse: 72   Resp:    Temp:    SpO2: 94% 93%    Filed Weights   12/22/16 2021 12/25/16 1030 12/25/16 1400  Weight: 57.7 kg (127 lb 1.6 oz) 55.2 kg (121 lb 11.1 oz) 51.4 kg (113 lb 5.1 oz)    ROS: Review of Systems  Constitutional: Negative for chills and fever.  Eyes: Negative for blurred vision.  Respiratory: Positive for cough and shortness of breath.   Cardiovascular: Negative for chest pain.  Gastrointestinal: Negative for abdominal pain, constipation, diarrhea, nausea and vomiting.  Genitourinary: Negative for dysuria.  Musculoskeletal: Negative for joint pain.  Neurological: Negative for dizziness and headaches.   Exam: Physical Exam  Constitutional: He is oriented to person, place, and time.  HENT:  Nose: No mucosal edema.  Mouth/Throat: No oropharyngeal exudate or posterior oropharyngeal edema.  Eyes: Pupils are equal, round, and reactive to light. Conjunctivae, EOM and lids are normal.  Neck: No JVD present. Carotid bruit is not present. No edema present. No thyroid mass and no thyromegaly present.  Cardiovascular: S1 normal and S2 normal.  Exam reveals no gallop.   No murmur heard. Pulses:      Dorsalis pedis pulses are 2+ on the right side, and 2+ on the left side.  Respiratory: No respiratory distress. He has decreased breath sounds in the right lower field and the left lower field. He has no wheezes. He has rhonchi in the right lower field and the left lower field. He has no rales.  GI: Soft. Bowel sounds are normal. There is no tenderness.  Musculoskeletal:       Right ankle: He exhibits no  swelling.       Left ankle: He exhibits no swelling.  Lymphadenopathy:    He has no cervical adenopathy.  Neurological: He is alert and oriented to person, place, and time. No cranial nerve deficit.  Skin: Skin is warm. No rash noted. Nails show no clubbing.  Psychiatric: He has a normal mood and affect.      Data Reviewed: Basic Metabolic Panel:  Recent Labs Lab 12/22/16 1443 12/23/16 0303 12/25/16 1030  NA 141 142 138  K 3.6 4.0 4.4  CL 93* 96* 95*  CO2 32 30 27  GLUCOSE 145* 175* 49*  BUN 36* 43* 65*  CREATININE 5.33* 6.05* 7.53*  CALCIUM 6.9* 6.6* 6.9*  PHOS  --   --  5.4*   Liver Function Tests:  Recent Labs Lab 12/25/16 1030  ALBUMIN 3.3*   CBC:  Recent Labs Lab 12/22/16 1443 12/25/16 1030  WBC 4.7 7.7  HGB 12.2* 10.3*  HCT 39.0* 31.2*  MCV 66.4* 65.9*  PLT 244 228   Cardiac Enzymes:  Recent Labs Lab 12/22/16 1443 12/22/16 2036 12/23/16 0303 12/23/16 0926  TROPONINI 0.11* 0.10* 0.09* 0.08*   BNP (last 3 results)  Recent Labs  03/09/16 1716  BNP 2,986.0*     CBG:  Recent Labs Lab 12/25/16 2105 12/26/16 0735 12/26/16 1138 12/26/16 1150 12/26/16 1207  GLUCAP 126* 179* 53* 53* 85    Recent Results (from the past 240 hour(s))  Blood culture (routine x 2)     Status: None (Preliminary result)   Collection Time: 12/22/16  4:17 PM  Result Value Ref Range Status   Specimen Description BLOOD BLOOD LEFT FOREARM  Final   Special Requests   Final    BOTTLES DRAWN AEROBIC AND ANAEROBIC Blood Culture results may not be optimal due to an excessive volume of blood received in culture bottles   Culture NO GROWTH 4 DAYS  Final   Report Status PENDING  Incomplete  Blood culture (routine x 2)     Status: None (Preliminary result)   Collection Time: 12/22/16  4:23 PM  Result Value Ref Range Status   Specimen Description BLOOD BLOOD LEFT HAND  Final   Special Requests   Final    BOTTLES DRAWN AEROBIC AND ANAEROBIC Blood Culture adequate  volume   Culture NO GROWTH 4 DAYS  Final   Report Status PENDING  Incomplete     Studies: Dg Chest 1 View  Result Date: 12/25/2016 CLINICAL DATA:  Shortness of Breath EXAM: CHEST 1 VIEW COMPARISON:  12/23/2016 FINDINGS: Cardiac shadow is mildly prominent. Aortic calcifications are again seen. Increasing left-sided pleural effusion is noted with likely underlying atelectasis/ infiltrate. Patchy interstitial changes are again identified bilaterally slightly worsened in the right upper lobe. IMPRESSION: Increasing changes of congestive failure. Increasing left pleural effusion and underlying atelectatic changes. Electronically Signed   By: Inez Catalina M.D.   On: 12/25/2016 09:59   Ct Angio Chest Pe W Or Wo Contrast  Result Date: 12/26/2016 CLINICAL DATA:  Worsening cough. Oxygen requirement since in the hospital. Dialysis for 3 years. History of prostate cancer. EXAM: CT ANGIOGRAPHY CHEST WITH CONTRAST TECHNIQUE: Multidetector CT imaging of the chest was performed using the standard protocol during bolus administration of intravenous contrast. Multiplanar CT image reconstructions and MIPs were obtained to evaluate the vascular anatomy. CONTRAST:  75 cc Isovue 370 COMPARISON:  Chest x-ray 12/25/2016 FINDINGS: Cardiovascular: Pulmonary arteries are well opacified. There is no acute pulmonary embolus. There is dense atherosclerosis of the aortic arch and is branches. Bovine arch anatomy. Significant coronary artery calcification. No significant pericardial effusion. Mediastinum/Nodes: The visualized portion of the thyroid gland has a normal appearance. There are mildly prominent lymph nodes in the superior mediastinum. Pretracheal lymph node is 1.4 cm. Confluent small AP window lymph nodes are identified, largest measuring approximately 1.4 cm. Lungs/Pleura: There are bilateral pleural effusions, left greater than right. Bibasilar atelectasis involves the left lower lobe greater than the right. There is  thickening of septal lines primarily involving the dependent aspects of the upper and lower lobes bilaterally. No suspicious pulmonary nodules. Upper Abdomen: Gallbladder is present. There is atherosclerotic calcification of the upper abdominal vessels. Musculoskeletal: Numerous remote right rib fractures. No suspicious osseous lesions. Review of the MIP images confirms the above findings. IMPRESSION: 1. Technically adequate exam showing no acute pulmonary embolus. 2. Cardiomegaly and bilateral pleural effusions associated with thickening of the septal lines bilaterally, compatible with pulmonary edema. 3.  Aortic atherosclerosis.  (ICD10-I70.0) 4. Bovine arch anatomy. 5. Coronary artery disease. 6. Mildly prominent mediastinal lymph nodes may be reactive. 7. Remote right rib fractures. 8. No evidence for osseous metastatic disease. Electronically Signed   By: Nolon Nations M.D.   On: 12/26/2016 11:10    Scheduled Meds: . amLODipine  10 mg Oral Daily  . aspirin EC  81 mg Oral Daily  . budesonide (PULMICORT) nebulizer solution  0.5 mg Nebulization BID  . carvedilol  25 mg  Oral BID WC  . chlorpheniramine-HYDROcodone  5 mL Oral Q12H  . dorzolamide-timolol  1 drop Right Eye BID  . epoetin (EPOGEN/PROCRIT) injection  4,000 Units Intravenous Q M,W,F-HD  . gabapentin  400 mg Oral Daily  . heparin  5,000 Units Subcutaneous Q8H  . insulin aspart  0-5 Units Subcutaneous QHS  . insulin aspart  0-9 Units Subcutaneous TID WC  . ipratropium-albuterol  3 mL Nebulization Q6H  . lisinopril  40 mg Oral Daily  . methylPREDNISolone (SOLU-MEDROL) injection  40 mg Intravenous Daily  . multivitamin  1 tablet Oral Daily  . polycarbophil  1,250 mg Oral Daily  . sertraline  25 mg Oral Daily  . sevelamer carbonate  800 mg Oral TID WC   Continuous Infusions: . levofloxacin (LEVAQUIN) IV Stopped (12/26/16 1235)    Assessment/Plan:  1. Acute hypoxic respiratory failure. On room air the patient's pulse ox is in the  70s. I ordered a CT scan of the chest and pulmonary embolism was ruled out. Likely due to fluid overload. 2. Acute congestive heart failure.  I do not see an echocardiogram report/result in the computer. Echocardiogram ordered.  Dialysis to manage fluid. May start Lasix. 3. History of COPD. Start nebulizers and low-dose steroids to see if this helps. Patient was empirically put on antibiotics for pneumonia but this was not commented on on the CT scan of the chest so this was ruled out. 4. Essential hypertension on lisinopril, Norvasc and Coreg 5. Blindness 6. Wheelchair-bound 7. End-stage renal disease on dialysis again today for fluid overload 8. Type 2 diabetes mellitus with relative hypoglycemia. Hold Glucotrol. Steroid should elevate sugars. 9. Elevated troponin demand ischemia from CHF.  Code Status:     Code Status Orders        Start     Ordered   12/25/16 1852  Limited resuscitation (code)  Continuous    Question Answer Comment  In the event of cardiac or respiratory ARREST: Initiate Code Blue, Call Rapid Response Yes   In the event of cardiac or respiratory ARREST: Perform CPR Yes   In the event of cardiac or respiratory ARREST: Perform Intubation/Mechanical Ventilation No   In the event of cardiac or respiratory ARREST: Use NIPPV/BiPAp only if indicated Yes   In the event of cardiac or respiratory ARREST: Administer ACLS medications if indicated Yes   In the event of cardiac or respiratory ARREST: Perform Defibrillation or Cardioversion if indicated Yes      12/25/16 1852    Code Status History    Date Active Date Inactive Code Status Order ID Comments User Context   12/25/2016  6:48 PM 12/25/2016  6:52 PM Partial Code [id number]  SimserKathyrn Drown, RN Inpatient   12/22/2016  8:18 PM 12/25/2016  6:48 PM Full Code [id number]  Baxter Hire, MD Inpatient   03/09/2016 10:35 PM 03/11/2016 11:21 PM Full Code [id number]  Harvie Bridge, DO Inpatient   11/10/2014  7:54 PM 11/15/2014   1:12 AM Full Code [id number]  Demetrios Loll, MD ED   10/28/2014  9:19 PM 10/31/2014  3:04 PM Full Code [id number]  Nicholes Mango, MD Inpatient    Advance Directive Documentation     Most Recent Value  Type of Advance Directive  Healthcare Power of [place], Living will  Pre-existing out of facility DNR order (yellow form or pink MOST form)  -  "MOST" Form in Place?  -     Family Communication: tried to call spouse at home but nobody answered Disposition  Plan: rehabilitation if pace program agrees versus home with home health.  Consultants:  nephrology  Antibiotics: levaquin  Time spent: 65 minutes  Loletha Grayer  [employer]

## 2016-12-26 NOTE — Care Management Note (Addendum)
Case Management Note  Patient Details  Name: [name] MRN: [id number] Date of Birth: February 07, 1935  Subjective/Objective:  Barriers to discharge: IV levofloxin, titrating amlodipine and coreg, elevated troponins, hemodialysis today and tomorrow. UR evaluating for possible change from OBS to Inpatient status.                  Action/Plan:   Expected Discharge Date:  12/24/16               Expected Discharge Plan:     In-House Referral:     Discharge planning Services     Post Acute Care Choice:    Choice offered to:     DME Arranged:    DME Agency:     HH Arranged:    HH Agency:     Status of Service:     If discussed at [employer], dates discussed:    Additional Comments:  Aniqua Briere A, RN 12/26/2016, 1:05 PM

## 2016-12-27 ENCOUNTER — Inpatient Hospital Stay (HOSPITAL_COMMUNITY)
Admit: 2016-12-27 | Discharge: 2016-12-27 | Disposition: A | Discharge status: Home / Self Care | Payer: Medicare (Managed Care) | Attending: Internal Medicine | Admitting: Internal Medicine

## 2016-12-27 DIAGNOSIS — I5030 Unspecified diastolic (congestive) heart failure: Secondary | ICD-10-CM

## 2016-12-27 LAB — ECHOCARDIOGRAM COMPLETE
HEIGHTINCHES: 66 in
WEIGHTICAEL: 1813.06 [oz_av]

## 2016-12-27 LAB — GLUCOSE, CAPILLARY
GLUCOSE-CAPILLARY: 100 mg/dL — AB (ref 65–99)
GLUCOSE-CAPILLARY: 180 mg/dL — AB (ref 65–99)
GLUCOSE-CAPILLARY: 210 mg/dL — AB (ref 65–99)
GLUCOSE-CAPILLARY: 50 mg/dL — AB (ref 65–99)
Glucose-Capillary: 195 mg/dL — ABNORMAL HIGH (ref 65–99)
Glucose-Capillary: 212 mg/dL — ABNORMAL HIGH (ref 65–99)
Glucose-Capillary: 168 mg/dL — ABNORMAL HIGH (ref 65–99)
Glucose-Capillary: 343 mg/dL — ABNORMAL HIGH (ref 65–99)
Glucose-Capillary: 308 mg/dL — ABNORMAL HIGH (ref 65–99)

## 2016-12-27 LAB — CULTURE, BLOOD (ROUTINE X 2)
CULTURE: NO GROWTH
Culture: NO GROWTH
Special Requests: ADEQUATE

## 2016-12-27 MED ORDER — FUROSEMIDE 40 MG PO TABS
80.0000 mg | ORAL_TABLET | Freq: Every day | ORAL | Status: DC
  Administered 2016-12-27: 80 mg via ORAL
  Filled 2016-12-27: qty 2

## 2016-12-27 MED ORDER — PREDNISONE 20 MG PO TABS
40.0000 mg | ORAL_TABLET | Freq: Every day | ORAL | Status: DC
  Administered 2016-12-27: 40 mg via ORAL
  Filled 2016-12-27: qty 2

## 2016-12-27 NOTE — Progress Notes (Signed)
Patient ID: [name], male   DOB: 1934/12/11, 82 y.o.   MRN: [id number]   [facility] PROGRESS NOTE  [name] YOV:[id number] DOB: 1934-04-08 DOA: 12/22/2016 PCP: Rinaldo Cloud, MD  HPI/Subjective: Patient states he feels okay. Some slight cough. States his breathing is better.  Objective: Vitals:   12/27/16 1258 12/27/16 1300  BP:    Pulse:    Resp:    Temp:    SpO2: 90% 90%    Filed Weights   12/22/16 2021 12/25/16 1030 12/25/16 1400  Weight: 57.7 kg (127 lb 1.6 oz) 55.2 kg (121 lb 11.1 oz) 51.4 kg (113 lb 5.1 oz)    ROS: Review of Systems  Constitutional: Negative for chills and fever.  Eyes: Negative for blurred vision.  Respiratory: Positive for cough and shortness of breath.   Cardiovascular: Negative for chest pain.  Gastrointestinal: Negative for abdominal pain, constipation, diarrhea, nausea and vomiting.  Genitourinary: Negative for dysuria.  Musculoskeletal: Negative for joint pain.  Neurological: Negative for dizziness and headaches.   Exam: Physical Exam  Constitutional: He is oriented to person, place, and time.  HENT:  Nose: No mucosal edema.  Mouth/Throat: No oropharyngeal exudate or posterior oropharyngeal edema.  Eyes: Pupils are equal, round, and reactive to light. Conjunctivae, EOM and lids are normal.  Neck: No JVD present. Carotid bruit is not present. No edema present. No thyroid mass and no thyromegaly present.  Cardiovascular: S1 normal and S2 normal.  Exam reveals no gallop.   No murmur heard. Pulses:      Dorsalis pedis pulses are 2+ on the right side, and 2+ on the left side.  Respiratory: No respiratory distress. He has decreased breath sounds in the right lower field and the left lower field. He has no wheezes. He has rhonchi in the right lower field and the left lower field. He has no rales.  GI: Soft. Bowel sounds are normal. There is no tenderness.  Musculoskeletal:       Right ankle: He exhibits no swelling.       Left  ankle: He exhibits no swelling.  Lymphadenopathy:    He has no cervical adenopathy.  Neurological: He is alert and oriented to person, place, and time. No cranial nerve deficit.  Skin: Skin is warm. No rash noted. Nails show no clubbing.  Psychiatric: He has a normal mood and affect.      Data Reviewed: Basic Metabolic Panel:  Recent Labs Lab 12/22/16 1443 12/23/16 0303 12/25/16 1030  NA 141 142 138  K 3.6 4.0 4.4  CL 93* 96* 95*  CO2 32 30 27  GLUCOSE 145* 175* 49*  BUN 36* 43* 65*  CREATININE 5.33* 6.05* 7.53*  CALCIUM 6.9* 6.6* 6.9*  PHOS  --   --  5.4*   Liver Function Tests:  Recent Labs Lab 12/25/16 1030  ALBUMIN 3.3*   CBC:  Recent Labs Lab 12/22/16 1443 12/25/16 1030  WBC 4.7 7.7  HGB 12.2* 10.3*  HCT 39.0* 31.2*  MCV 66.4* 65.9*  PLT 244 228   Cardiac Enzymes:  Recent Labs Lab 12/22/16 1443 12/22/16 2036 12/23/16 0303 12/23/16 0926  TROPONINI 0.11* 0.10* 0.09* 0.08*   BNP (last 3 results)  Recent Labs  03/09/16 1716  BNP 2,986.0*     CBG:  Recent Labs Lab 12/26/16 2359 12/27/16 0317 12/27/16 0516 12/27/16 0735 12/27/16 1058  GLUCAP 100* 195* 212* 180* 168*    Recent Results (from the past 240 hour(s))  Blood culture (routine x 2)  Status: None   Collection Time: 12/22/16  4:17 PM  Result Value Ref Range Status   Specimen Description BLOOD BLOOD LEFT FOREARM  Final   Special Requests   Final    BOTTLES DRAWN AEROBIC AND ANAEROBIC Blood Culture results may not be optimal due to an excessive volume of blood received in culture bottles   Culture NO GROWTH 5 DAYS  Final   Report Status 12/27/2016 FINAL  Final  Blood culture (routine x 2)     Status: None   Collection Time: 12/22/16  4:23 PM  Result Value Ref Range Status   Specimen Description BLOOD BLOOD LEFT HAND  Final   Special Requests   Final    BOTTLES DRAWN AEROBIC AND ANAEROBIC Blood Culture adequate volume   Culture NO GROWTH 5 DAYS  Final   Report Status  12/27/2016 FINAL  Final     Studies: Ct Angio Chest Pe W Or Wo Contrast  Result Date: 12/26/2016 CLINICAL DATA:  Worsening cough. Oxygen requirement since in the hospital. Dialysis for 3 years. History of prostate cancer. EXAM: CT ANGIOGRAPHY CHEST WITH CONTRAST TECHNIQUE: Multidetector CT imaging of the chest was performed using the standard protocol during bolus administration of intravenous contrast. Multiplanar CT image reconstructions and MIPs were obtained to evaluate the vascular anatomy. CONTRAST:  75 cc Isovue 370 COMPARISON:  Chest x-ray 12/25/2016 FINDINGS: Cardiovascular: Pulmonary arteries are well opacified. There is no acute pulmonary embolus. There is dense atherosclerosis of the aortic arch and is branches. Bovine arch anatomy. Significant coronary artery calcification. No significant pericardial effusion. Mediastinum/Nodes: The visualized portion of the thyroid gland has a normal appearance. There are mildly prominent lymph nodes in the superior mediastinum. Pretracheal lymph node is 1.4 cm. Confluent small AP window lymph nodes are identified, largest measuring approximately 1.4 cm. Lungs/Pleura: There are bilateral pleural effusions, left greater than right. Bibasilar atelectasis involves the left lower lobe greater than the right. There is thickening of septal lines primarily involving the dependent aspects of the upper and lower lobes bilaterally. No suspicious pulmonary nodules. Upper Abdomen: Gallbladder is present. There is atherosclerotic calcification of the upper abdominal vessels. Musculoskeletal: Numerous remote right rib fractures. No suspicious osseous lesions. Review of the MIP images confirms the above findings. IMPRESSION: 1. Technically adequate exam showing no acute pulmonary embolus. 2. Cardiomegaly and bilateral pleural effusions associated with thickening of the septal lines bilaterally, compatible with pulmonary edema. 3.  Aortic atherosclerosis.  (ICD10-I70.0) 4. Bovine  arch anatomy. 5. Coronary artery disease. 6. Mildly prominent mediastinal lymph nodes may be reactive. 7. Remote right rib fractures. 8. No evidence for osseous metastatic disease. Electronically Signed   By: Nolon Nations M.D.   On: 12/26/2016 11:10    Scheduled Meds: . amLODipine  10 mg Oral Daily  . aspirin EC  81 mg Oral Daily  . budesonide (PULMICORT) nebulizer solution  0.5 mg Nebulization BID  . carvedilol  25 mg Oral BID WC  . chlorpheniramine-HYDROcodone  5 mL Oral Q12H  . dorzolamide-timolol  1 drop Right Eye BID  . epoetin (EPOGEN/PROCRIT) injection  4,000 Units Intravenous Q M,W,F-HD  . furosemide  80 mg Oral Daily  . gabapentin  400 mg Oral Daily  . heparin  5,000 Units Subcutaneous Q8H  . ipratropium-albuterol  3 mL Nebulization Q6H  . lisinopril  40 mg Oral Daily  . multivitamin  1 tablet Oral Daily  . polycarbophil  1,250 mg Oral Daily  . predniSONE  40 mg Oral Q breakfast  .  sertraline  25 mg Oral Daily  . sevelamer carbonate  800 mg Oral TID WC   Continuous Infusions:   Assessment/Plan:  1. Acute hypoxic respiratory failure. On room air the patient's pulse ox is in the 76%.  CT scan of the chest negative for pulmonary embolism. Likely combination of COPD and congestive heart failure. In speaking with the patient's daughter, she states that he has been on oxygen in the past. This will likely have to be set up for him at home also. 2. Acute diastolic congestive heart failure.  Dialysis to manage fluid. I started Lasix. 3. History of COPD. Start nebulizers and low-dose steroids to see if this helps. Patient was empirically put on antibiotics for pneumonia but this was not commented on on the CT scan of the chest so this was ruled out.  Antibiotics stopped. 4. Essential hypertension on lisinopril, Norvasc and Coreg 5. Blindness 6. Wheelchair-bound 7. End-stage renal disease on dialysis. Patient had a neck she dialysis on Sunday 8. Type 2 diabetes mellitus with  relative hypoglycemia. Hold Glucotrol. Steroid should elevate sugars. 9. Elevated troponin demand ischemia from CHF.  Code Status:     Code Status Orders        Start     Ordered   12/25/16 1852  Limited resuscitation (code)  Continuous    Question Answer Comment  In the event of cardiac or respiratory ARREST: Initiate Code Blue, Call Rapid Response Yes   In the event of cardiac or respiratory ARREST: Perform CPR Yes   In the event of cardiac or respiratory ARREST: Perform Intubation/Mechanical Ventilation No   In the event of cardiac or respiratory ARREST: Use NIPPV/BiPAp only if indicated Yes   In the event of cardiac or respiratory ARREST: Administer ACLS medications if indicated Yes   In the event of cardiac or respiratory ARREST: Perform Defibrillation or Cardioversion if indicated Yes      09 /28/18 1852    Code Status History    Date Active Date Inactive Code Status Order ID Comments User Context   12/25/2016  6:48 PM 12/25/2016  6:52 PM Partial Code [id number]  Latanya Maudlin, RN Inpatient   12/22/2016  8:18 PM 12/25/2016  6:48 PM Full Code [id number]  Baxter Hire, MD Inpatient   03/09/2016 10:35 PM 03/11/2016 11:21 PM Full Code [id number]  Harvie Bridge, DO Inpatient   11/10/2014  7:54 PM 11/15/2014  1:12 AM Full Code [id number]  Demetrios Loll, MD ED   10/28/2014  9:19 PM 10/31/2014  3:04 PM Full Code [id number]  Nicholes Mango, MD Inpatient    Advance Directive Documentation     Most Recent Value  Type of Advance Directive  Healthcare Power of Attorney, Living will  Pre-existing out of facility DNR order (yellow form or pink MOST form)  -  "MOST" Form in Place?  -     Family Communication: Spoke with daughter on the phone Disposition Plan: Spoke with Dr. Ovid Curd from the pace program and likely he'll go home with pace program following  Consultants:  nephrology  Antibiotics: levaquin  Time spent: 28 minutes  Loletha Grayer  [employer]

## 2016-12-27 NOTE — Progress Notes (Signed)
MD notified of lack of IV access. Orders for PICC line in am. MD wants patient to eat a snack to maintain sugars in 150s-200s since he has no access. Will give snack and continue to monitor.

## 2016-12-27 NOTE — Progress Notes (Signed)
[facility]  ROUNDING NOTE   Subjective:   Hemodialysis treatment last three days. Total UF of 6.5 liters. No longer requiring oxygen.   Continues to have shortness of breath and cough.   Hemodialysis yesterday. Tolerated treatment well.   Objective:  Vital signs in last 24 hours:  Temp:  [97.4 F (36.3 C)-98.3 F (36.8 C)] 98.3 F (36.8 C) (09/30 0757) Pulse Rate:  [67-72] 71 (09/30 0757) Resp:  [16-20] 18 (09/30 0757) BP: (109-151)/(40-86) 127/58 (09/30 0757) SpO2:  [90 %-97 %] 91 % (09/30 0757) FiO2 (%):  [32 %] 32 % (09/30 0155)  Weight change:  Filed Weights   12/22/16 2021 12/25/16 1030 12/25/16 1400  Weight: 57.7 kg (127 lb 1.6 oz) 55.2 kg (121 lb 11.1 oz) 51.4 kg (113 lb 5.1 oz)    Intake/Output: I/O last 3 completed shifts: In: 444 [P.O.:240; I.V.:104; IV Piggyback:100] Out: 1500 [Other:1500]   Intake/Output this shift:  No intake/output data recorded.  Physical Exam: General: NAD, laying in bed  Head: Normocephalic, atraumatic. Moist oral mucosal membranes  Eyes: +blind  Neck: Supple, trachea midline  Lungs:  Clear bilaterally  Heart: Regular rate and rhythm  Abdomen:  Soft, nontender,   Extremities: No peripheral edema.  Neurologic: Nonfocal, moving all four extremities  Skin: No lesions  Access: Right AVF    Basic Metabolic Panel:  Recent Labs Lab 12/22/16 1443 12/23/16 0303 12/25/16 1030  NA 141 142 138  K 3.6 4.0 4.4  CL 93* 96* 95*  CO2 32 30 27  GLUCOSE 145* 175* 49*  BUN 36* 43* 65*  CREATININE 5.33* 6.05* 7.53*  CALCIUM 6.9* 6.6* 6.9*  PHOS  --   --  5.4*    Liver Function Tests:  Recent Labs Lab 12/25/16 1030  ALBUMIN 3.3*   No results for input(s): LIPASE, AMYLASE in the last 168 hours. No results for input(s): AMMONIA in the last 168 hours.  CBC:  Recent Labs Lab 12/22/16 1443 12/25/16 1030  WBC 4.7 7.7  HGB 12.2* 10.3*  HCT 39.0* 31.2*  MCV 66.4* 65.9*  PLT 244 228    Cardiac  Enzymes:  Recent Labs Lab 12/22/16 1443 12/22/16 2036 12/23/16 0303 12/23/16 0926  TROPONINI 0.11* 0.10* 0.09* 0.08*    BNP: Invalid input(s): POCBNP  CBG:  Recent Labs Lab 12/26/16 2359 12/27/16 0317 12/27/16 0516 12/27/16 0735 12/27/16 1058  GLUCAP 100* 195* 212* 180* 168*    Microbiology: Results for orders placed or performed during the hospital encounter of 12/22/16  Blood culture (routine x 2)     Status: None   Collection Time: 12/22/16  4:17 PM  Result Value Ref Range Status   Specimen Description BLOOD BLOOD LEFT FOREARM  Final   Special Requests   Final    BOTTLES DRAWN AEROBIC AND ANAEROBIC Blood Culture results may not be optimal due to an excessive volume of blood received in culture bottles   Culture NO GROWTH 5 DAYS  Final   Report Status 12/27/2016 FINAL  Final  Blood culture (routine x 2)     Status: None   Collection Time: 12/22/16  4:23 PM  Result Value Ref Range Status   Specimen Description BLOOD BLOOD LEFT HAND  Final   Special Requests   Final    BOTTLES DRAWN AEROBIC AND ANAEROBIC Blood Culture adequate volume   Culture NO GROWTH 5 DAYS  Final   Report Status 12/27/2016 FINAL  Final    Coagulation Studies: No results for input(s): LABPROT, INR in  the last 72 hours.  Urinalysis: No results for input(s): COLORURINE, LABSPEC, PHURINE, GLUCOSEU, HGBUR, BILIRUBINUR, KETONESUR, PROTEINUR, UROBILINOGEN, NITRITE, LEUKOCYTESUR in the last 72 hours.  Invalid input(s): APPERANCEUR    Imaging: Ct Angio Chest Pe W Or Wo Contrast  Result Date: 12/26/2016 CLINICAL DATA:  Worsening cough. Oxygen requirement since in the hospital. Dialysis for 3 years. History of prostate cancer. EXAM: CT ANGIOGRAPHY CHEST WITH CONTRAST TECHNIQUE: Multidetector CT imaging of the chest was performed using the standard protocol during bolus administration of intravenous contrast. Multiplanar CT image reconstructions and MIPs were obtained to evaluate the vascular  anatomy. CONTRAST:  75 cc Isovue 370 COMPARISON:  Chest x-ray 12/25/2016 FINDINGS: Cardiovascular: Pulmonary arteries are well opacified. There is no acute pulmonary embolus. There is dense atherosclerosis of the aortic arch and is branches. Bovine arch anatomy. Significant coronary artery calcification. No significant pericardial effusion. Mediastinum/Nodes: The visualized portion of the thyroid gland has a normal appearance. There are mildly prominent lymph nodes in the superior mediastinum. Pretracheal lymph node is 1.4 cm. Confluent small AP window lymph nodes are identified, largest measuring approximately 1.4 cm. Lungs/Pleura: There are bilateral pleural effusions, left greater than right. Bibasilar atelectasis involves the left lower lobe greater than the right. There is thickening of septal lines primarily involving the dependent aspects of the upper and lower lobes bilaterally. No suspicious pulmonary nodules. Upper Abdomen: Gallbladder is present. There is atherosclerotic calcification of the upper abdominal vessels. Musculoskeletal: Numerous remote right rib fractures. No suspicious osseous lesions. Review of the MIP images confirms the above findings. IMPRESSION: 1. Technically adequate exam showing no acute pulmonary embolus. 2. Cardiomegaly and bilateral pleural effusions associated with thickening of the septal lines bilaterally, compatible with pulmonary edema. 3.  Aortic atherosclerosis.  (ICD10-I70.0) 4. Bovine arch anatomy. 5. Coronary artery disease. 6. Mildly prominent mediastinal lymph nodes may be reactive. 7. Remote right rib fractures. 8. No evidence for osseous metastatic disease. Electronically Signed   By: Nolon Nations M.D.   On: 12/26/2016 11:10     Medications:    . amLODipine  10 mg Oral Daily  . aspirin EC  81 mg Oral Daily  . budesonide (PULMICORT) nebulizer solution  0.5 mg Nebulization BID  . carvedilol  25 mg Oral BID WC  . chlorpheniramine-HYDROcodone  5 mL Oral  Q12H  . dorzolamide-timolol  1 drop Right Eye BID  . epoetin (EPOGEN/PROCRIT) injection  4,000 Units Intravenous Q M,W,F-HD  . furosemide  80 mg Oral Daily  . gabapentin  400 mg Oral Daily  . heparin  5,000 Units Subcutaneous Q8H  . ipratropium-albuterol  3 mL Nebulization Q6H  . lisinopril  40 mg Oral Daily  . multivitamin  1 tablet Oral Daily  . polycarbophil  1,250 mg Oral Daily  . predniSONE  40 mg Oral Q breakfast  . sertraline  25 mg Oral Daily  . sevelamer carbonate  800 mg Oral TID WC   acetaminophen **OR** acetaminophen, ondansetron  Assessment/ Plan:  [name] is a 82 y.o. black male end stage renal disease on hemodialysis, hypertension, peripheral vascular disease, left AKA, MGUS, glaucoma, diabetes mellitus type II, congestive heart failure, blindness  MWF [facility] [place].   1. End Stage Renal Disease: on hemodialysis. MWF.  Three consecutive days of hemodialysis for pulmonary edema. Improved respiratory function. Euvolemic on examination Monitor daily for dialysis need.  - Resume MWF schedule.   2. Hypertension: blood pressure at goal.  - amlodipine, carvedilol, lisinopril.   3. Anemia  of chronic kidney disease: hemoglobin 10.3 - EPO 4000 MWF  4. Secondary Hyperparathyroidism: with hypocalcemia - sevelamer with meals.   5. Diabetes mellitus type II with chronic kidney disease:  - continue glucose control.    LOS: 1 Roshonda Sperl 9/30/201811:54 AM

## 2016-12-27 NOTE — Progress Notes (Signed)
2100 Pt bg -56. No iv access but pt tolerated POs. 240cc apple juice given with some peanut butter. Recheck bg - 32. Glucose gel given x 1, recheck BG - 55, Glucose tabs (3) given x 1, recheck BG - 50. MD notified. Orders for d5NS @40  x 6 hrs. IV access obtained but lost because of infiltration. MD notified again, Orders for IM glucagon given. Gave dose, recheck bg - 100. IV access obtained again fluids infused x 3 hours but access observed to be infiltrated again. Paged MD awaiting call back. BG currently 195.

## 2016-12-28 LAB — RENAL FUNCTION PANEL
Albumin: 3.3 g/dL — ABNORMAL LOW (ref 3.5–5.0)
Anion gap: 17 — ABNORMAL HIGH (ref 5–15)
BUN: 82 mg/dL — AB (ref 6–20)
CALCIUM: 7 mg/dL — AB (ref 8.9–10.3)
CO2: 21 mmol/L — AB (ref 22–32)
CREATININE: 8.45 mg/dL — AB (ref 0.61–1.24)
Chloride: 92 mmol/L — ABNORMAL LOW (ref 101–111)
GFR calc Af Amer: 6 mL/min — ABNORMAL LOW (ref >60)
GFR calc non Af Amer: 5 mL/min — ABNORMAL LOW (ref >60)
GLUCOSE: 306 mg/dL — AB (ref 65–99)
Phosphorus: 6.8 mg/dL — ABNORMAL HIGH (ref 2.5–4.6)
Potassium: 5.1 mmol/L (ref 3.5–5.1)
Sodium: 130 mmol/L — ABNORMAL LOW (ref 135–145)

## 2016-12-28 LAB — GLUCOSE, CAPILLARY
GLUCOSE-CAPILLARY: 271 mg/dL — AB (ref 65–99)
Glucose-Capillary: 269 mg/dL — ABNORMAL HIGH (ref 65–99)
Glucose-Capillary: 295 mg/dL — ABNORMAL HIGH (ref 65–99)

## 2016-12-28 LAB — CBC
HCT: 31.1 % — ABNORMAL LOW (ref 40.0–52.0)
Hemoglobin: 9.8 g/dL — ABNORMAL LOW (ref 13.0–18.0)
MCH: 21 pg — AB (ref 26.0–34.0)
MCHC: 31.6 g/dL — AB (ref 32.0–36.0)
MCV: 66.5 fL — ABNORMAL LOW (ref 80.0–100.0)
PLATELETS: 262 10*3/uL (ref 150–440)
RBC: 4.68 MIL/uL (ref 4.40–5.90)
RDW: 20.7 % — AB (ref 11.5–14.5)
WBC: 4 10*3/uL (ref 3.8–10.6)

## 2016-12-28 MED ORDER — FUROSEMIDE 80 MG PO TABS: 80.0000 mg | ORAL_TABLET | Freq: Every day | ORAL | 0 refills | Status: AC

## 2016-12-28 MED ORDER — CARVEDILOL 25 MG PO TABS: 25.0000 mg | ORAL_TABLET | Freq: Two times a day (BID) | ORAL | 0 refills | Status: AC

## 2016-12-28 MED ORDER — INSULIN ASPART 100 UNIT/ML ~~LOC~~ SOLN
0.0000 [IU] | Freq: Three times a day (TID) | SUBCUTANEOUS | Status: DC
  Administered 2016-12-28: 5 [IU] via SUBCUTANEOUS
  Filled 2016-12-28: qty 1

## 2016-12-28 MED ORDER — NEPRO/CARBSTEADY PO LIQD: 237.0000 mL | Freq: Three times a day (TID) | ORAL | 0 refills | Status: AC

## 2016-12-28 MED ORDER — AMLODIPINE BESYLATE 10 MG PO TABS: 10.0000 mg | ORAL_TABLET | Freq: Every day | ORAL | 0 refills | Status: AC

## 2016-12-28 MED ORDER — INSULIN ASPART 100 UNIT/ML ~~LOC~~ SOLN: 0.0000 [IU] | Freq: Every day | SUBCUTANEOUS | Status: DC

## 2016-12-28 MED ORDER — PREDNISONE 20 MG PO TABS
30.0000 mg | ORAL_TABLET | Freq: Every day | ORAL | Status: DC
  Administered 2016-12-28: 30 mg via ORAL
  Filled 2016-12-28: qty 2

## 2016-12-28 MED ORDER — INSULIN ASPART 100 UNIT/ML ~~LOC~~ SOLN: SUBCUTANEOUS | 0 refills | Status: AC

## 2016-12-28 MED ORDER — ALBUTEROL SULFATE HFA 108 (90 BASE) MCG/ACT IN AERS: 2.0000 | INHALATION_SPRAY | Freq: Four times a day (QID) | RESPIRATORY_TRACT | 0 refills | Status: AC | PRN

## 2016-12-28 MED ORDER — NEPRO/CARBSTEADY PO LIQD: 237.0000 mL | Freq: Three times a day (TID) | ORAL | Status: DC

## 2016-12-28 MED ORDER — TIOTROPIUM BROMIDE MONOHYDRATE 18 MCG IN CAPS: 18.0000 ug | ORAL_CAPSULE | Freq: Every day | RESPIRATORY_TRACT | 0 refills | Status: AC

## 2016-12-28 MED ORDER — BUDESONIDE-FORMOTEROL FUMARATE 80-4.5 MCG/ACT IN AERO: 2.0000 | INHALATION_SPRAY | Freq: Two times a day (BID) | RESPIRATORY_TRACT | 0 refills | Status: AC

## 2016-12-28 NOTE — Clinical Social Work Placement (Signed)
   CLINICAL SOCIAL WORK PLACEMENT  NOTE  Date:  12/28/2016  Patient Details  Name: [name] MRN: [id number] Date of Birth: [birth year]  Clinical Social Work is seeking post-discharge placement for this patient at the [place] level of care (*CSW will initial, date and re-position this form in  chart as items are completed):  Yes   Patient/family provided with [place] Work Department's list of facilities offering this level of care within the geographic area requested by the patient (or if unable, by the patient's family).  Yes   Patient/family informed of their freedom to choose among providers that offer the needed level of care, that participate in Medicare, Medicaid or managed care program needed by the patient, have an available bed and are willing to accept the patient.   (n/a)   Patient/family informed of [place]'s ownership interest in [facility] and [facility], as well as of the fact that they are under no obligation to receive care at these facilities.  PASRR submitted to EDS on 12/25/16     PASRR number received on 12/25/16     Existing PASRR number confirmed on       FL2 transmitted to all facilities in geographic area requested by pt/family on 12/25/16     FL2 transmitted to all facilities within larger geographic area on       Patient informed that his/her managed care company has contracts with or will negotiate with certain facilities, including the following:         (n/a)   Patient/family informed of bed offers received.  Patient chooses bed at  ([facility])     Physician recommends and patient chooses bed at  [facility])    Patient to be transferred to  ([facility] transport) on 12/28/16.  Patient to be transferred to facility by  ([facility])     Patient family notified on 12/28/16 of transfer.  Name of family member notified:   (MD left message for daughter; [facility] will also contact)     PHYSICIAN     Additional Comment:    _______________________________________________ Shela Leff, LCSW 12/28/2016, 11:09 AM

## 2016-12-28 NOTE — Procedures (Signed)
Pt arrived to HDU via hospital transport staff at or about 1200 and was assessed prior to tx and found to have a RUA AVF, which was prepared per p&p and cannulated w 15 ga needles w/o issue; order includes RTD 3.5 hours on 3251 bath, BFR 400, DFR 600 and attempt to achieve 2 kg fluid removal as tolerated by pt; pt initiated at 1215 w/o issue; pt completed his entire tx and achieved target of 1.5 kg fluid removal w/o complication, all blood was rinsed back and needles aseptically de-cannulated and pressure dressings applied; hemostasis achieved w/o prolonged bleeding and pt was stable at the end of his tx and when leaving HDU under the care of transport staff; report called to primary RN

## 2016-12-28 NOTE — Care Management (Signed)
RNCM consult placed for home O2.  Patient to discharge to SNF. CSW facilitating.  RNCM signing off.  Please re consult if indicated.

## 2016-12-28 NOTE — Discharge Instructions (Signed)
[facility] appointment on January 05 2017 at 11:20am with Darylene Price, [place]. Please call [phone] to reschedule.

## 2016-12-28 NOTE — Progress Notes (Signed)
[facility]  ROUNDING NOTE   Subjective:   Patient is doing fair at present Conversation ongoing with PMD and daughter about CODE STATUS and disposition Patient wants to continue dialysis.  He has agreed to go to nursing home Shortness of breath Hemodialysis scheduled for today  Objective:  Vital signs in last 24 hours:  Temp:  [97.4 F (36.3 C)-98 F (36.7 C)] 97.4 F (36.3 C) (10/01 0451) Pulse Rate:  [65-78] 76 (10/01 1400) Resp:  [12-20] 12 (10/01 1400) BP: (119-142)/(50-67) 136/65 (10/01 1400) SpO2:  [84 %-99 %] 95 % (10/01 1400) FiO2 (%):  [32 %] 32 % (10/01 0204)  Weight change:  Filed Weights   12/22/16 2021 12/25/16 1030 12/25/16 1400  Weight: 57.7 kg (127 lb 1.6 oz) 55.2 kg (121 lb 11.1 oz) 51.4 kg (113 lb 5.1 oz)    Intake/Output: I/O last 3 completed shifts: In: 224 [P.O.:120; I.V.:104] Out: 0    Intake/Output this shift:  Total I/O In: 360 [P.O.:360] Out: -   Physical Exam: General: NAD, laying in bed  Head: Normocephalic, atraumatic. Moist oral mucosal membranes  Eyes: +blind  Neck: Supple, trachea midline  Lungs:  Clear bilaterally  Heart: Regular rate and rhythm  Abdomen:  Soft, nontender,   Extremities: No peripheral edema.  Neurologic: Nonfocal, moving all four extremities  Skin: No lesions  Access: Right AVF    Basic Metabolic Panel:  Recent Labs Lab 12/22/16 1443 12/23/16 0303 12/25/16 1030 12/28/16 1200  NA 141 142 138 130*  K 3.6 4.0 4.4 5.1  CL 93* 96* 95* 92*  CO2 32 30 27 21*  GLUCOSE 145* 175* 49* 306*  BUN 36* 43* 65* 82*  CREATININE 5.33* 6.05* 7.53* 8.45*  CALCIUM 6.9* 6.6* 6.9* 7.0*  PHOS  --   --  5.4* 6.8*    Liver Function Tests:  Recent Labs Lab 12/25/16 1030 12/28/16 1200  ALBUMIN 3.3* 3.3*   No results for input(s): LIPASE, AMYLASE in the last 168 hours. No results for input(s): AMMONIA in the last 168 hours.  CBC:  Recent Labs Lab 12/22/16 1443 12/25/16 1030 12/28/16 1200  WBC  4.7 7.7 4.0  HGB 12.2* 10.3* 9.8*  HCT 39.0* 31.2* 31.1*  MCV 66.4* 65.9* 66.5*  PLT 244 228 262    Cardiac Enzymes:  Recent Labs Lab 12/22/16 1443 12/22/16 2036 12/23/16 0303 12/23/16 0926  TROPONINI 0.11* 0.10* 0.09* 0.08*    BNP: Invalid input(s): POCBNP  CBG:  Recent Labs Lab 12/27/16 1945 12/27/16 2326 12/28/16 0407 12/28/16 0740 12/28/16 1140  GLUCAP 343* 308* 271* 269* 295*    Microbiology: Results for orders placed or performed during the hospital encounter of 12/22/16  Blood culture (routine x 2)     Status: None   Collection Time: 12/22/16  4:17 PM  Result Value Ref Range Status   Specimen Description BLOOD BLOOD LEFT FOREARM  Final   Special Requests   Final    BOTTLES DRAWN AEROBIC AND ANAEROBIC Blood Culture results may not be optimal due to an excessive volume of blood received in culture bottles   Culture NO GROWTH 5 DAYS  Final   Report Status 12/27/2016 FINAL  Final  Blood culture (routine x 2)     Status: None   Collection Time: 12/22/16  4:23 PM  Result Value Ref Range Status   Specimen Description BLOOD BLOOD LEFT HAND  Final   Special Requests   Final    BOTTLES DRAWN AEROBIC AND ANAEROBIC Blood Culture adequate volume  Culture NO GROWTH 5 DAYS  Final   Report Status 12/27/2016 FINAL  Final    Coagulation Studies: No results for input(s): LABPROT, INR in the last 72 hours.  Urinalysis: No results for input(s): COLORURINE, LABSPEC, PHURINE, GLUCOSEU, HGBUR, BILIRUBINUR, KETONESUR, PROTEINUR, UROBILINOGEN, NITRITE, LEUKOCYTESUR in the last 72 hours.  Invalid input(s): APPERANCEUR    Imaging: No results found.   Medications:    . amLODipine  10 mg Oral Daily  . aspirin EC  81 mg Oral Daily  . budesonide (PULMICORT) nebulizer solution  0.5 mg Nebulization BID  . carvedilol  25 mg Oral BID WC  . chlorpheniramine-HYDROcodone  5 mL Oral Q12H  . dorzolamide-timolol  1 drop Right Eye BID  . epoetin (EPOGEN/PROCRIT) injection   4,000 Units Intravenous Q M,W,F-HD  . furosemide  80 mg Oral Daily  . gabapentin  400 mg Oral Daily  . heparin  5,000 Units Subcutaneous Q8H  . insulin aspart  0-5 Units Subcutaneous QHS  . insulin aspart  0-9 Units Subcutaneous TID WC  . ipratropium-albuterol  3 mL Nebulization Q6H  . lisinopril  40 mg Oral Daily  . multivitamin  1 tablet Oral Daily  . polycarbophil  1,250 mg Oral Daily  . predniSONE  30 mg Oral Q breakfast  . sertraline  25 mg Oral Daily  . sevelamer carbonate  800 mg Oral TID WC   acetaminophen **OR** acetaminophen, ondansetron  Assessment/ Plan:  [name] is a 82 y.o. black male end stage renal disease on hemodialysis, hypertension, peripheral vascular disease, left AKA, MGUS, glaucoma, diabetes mellitus type II, congestive heart failure, blindness  MWF [facility] [place].   1. End Stage Renal Disease: on hemodialysis. MWF.  Three consecutive days of hemodialysis for pulmonary edema. Improved respiratory function. . Euvolemic on examination Monitor daily for dialysis need.  - Resume MWF schedule.   2. Shortness of breath from pulmonary edema Improved with volume removal  3. Anemia of chronic kidney disease: hemoglobin 9.8 - EPO 4000 MWF  4. Secondary Hyperparathyroidism: with hypocalcemia - sevelamer with meals.   5. Diabetes mellitus type II with chronic kidney disease:  - continue glucose control.   6.  Disposition Patient has agreed to go to a nursing home   LOS: 2 [name] 10/1/20182:03 PM

## 2016-12-28 NOTE — Progress Notes (Signed)
Initial Nutrition Assessment  DOCUMENTATION CODES:   Severe malnutrition in context of chronic illness  INTERVENTION:   Recommend vitamin D supplementation to increase calcium absorption.    Nepro Shake po TID, each supplement provides 425 kcal and 19 grams protein  Renal MVI  NUTRITION DIAGNOSIS:   Malnutrition (severe) related to chronic illness (COPD, ESRD on HD, CHF, DM) as evidenced by severe depletion of body fat, severe depletion of muscle mass.  GOAL:   Patient will meet greater than or equal to 90% of their needs  MONITOR:   PO intake, Supplement acceptance, Labs, Weight trends, I & O's  REASON FOR ASSESSMENT:   Other (Comment) (low BMI)    ASSESSMENT:   82 y.o. black male end stage renal disease on hemodialysis, secondary hyperthyroidism, hypertension, peripheral vascular disease, left AKA, MGUS, glaucoma, diabetes mellitus type II, congestive heart failure, blindness  Met with pt in room today. Pt reports good appetite and oral intake pta. Pt is currently eating 50-100% of meals in hospital. Pt does not like Ensure but is willing to try vanilla Nepro. Pt is blind and wheelchair bound at baseline; pt with L AKA. Per chart, pt appears to have lost some weight but it is difficult to determine exact wt changes r/t edema and HD. Pt reports that he does feel like he has lost some weight. Phosphorus elevated today; pt on Renvela. Pt with low calcium and may benefit from vitamin D supplementation.     Medications reviewed and include: aspirin, epoetin, lasix, heparin, insulin, Renal MVI, prednisone, renvela, fibercon   Labs reviewed: Na 130(L), K 5.1 wnl, Cl 92(L), BUN 82(H), creat 8.45(H), Ca 7.0(L) adj. 7.56(L), P 6.8(H), alb 3.3(L) Hgb 9.8(L), Hct 31.1(L) Glucose- 306(H) AIC- 7.8(H)- 02/2016 PTH- 452(H)- 10/2014  Nutrition-Focused physical exam completed. Findings are severe fat and muscle depletions over entire body, and mild generalized edema. Pt with L AKA    Diet Order:  Diet renal/carb modified with fluid restriction Diet-HS Snack? Nothing; Room service appropriate? Yes; Fluid consistency: Thin  Skin:  Reviewed, no issues  Last BM:  9/28  Height:   Ht Readings from Last 1 Encounters:  12/22/16 _0  (1.676 m)    Weight:   Wt Readings from Last 1 Encounters:  12/25/16 113 lb 5.1 oz (51.4 kg)    Ideal Body Weight:  56.8 kg (adjusted for AKA)  BMI:  Body mass index is 18.29 kg/m.  Estimated Nutritional Needs:   Kcal:  1600-1900kcal/day   Protein:  87-98g/day   Fluid:  >1.6L/day or per MD  EDUCATION NEEDS:   Education needs addressed  Koleen Distance MS, RD, LDN Pager #[phone] After Hours Pager: [phone]

## 2016-12-28 NOTE — Progress Notes (Signed)
Discharge  Pt was received for HD and [facility] transport was here at that time. Pt had no PIV to remove and no complaints of pain. Paperwork sent with transporter and report called to [facility].

## 2016-12-28 NOTE — Clinical Social Work Note (Signed)
Dr. Ovid Curd with [facility] has made the determination that patient will need to go to [facility] at discharge today. Discharge information sent to [facility] and [facility] will transport patient once patient is finished with dialysis. [facility] informed that patient might be more comfortable going by EMS and that he will also require oxygen however, Dr. Ovid Curd is making decision for [facility] to transport. Shela Leff MSW,LCSW [phone]

## 2016-12-28 NOTE — Discharge Summary (Addendum)
[facility] [place] NAME: [name]    MR#:  [id number]  DATE OF BIRTH:  1934/09/10  DATE OF ADMISSION:  12/22/2016 ADMITTING PHYSICIAN: Baxter Hire, MD  DATE OF DISCHARGE: 12/28/2016  PRIMARY CARE PHYSICIAN: [facility] Dr Ovid Curd   ADMISSION DIAGNOSIS:  sent by dr  Isla Pence DIAGNOSIS:  Active Problems:   Congestive heart failure (CHF) ([place])   Acute respiratory failure with hypoxia ([place])   SECONDARY DIAGNOSIS:   Past Medical History:  Diagnosis Date  . Anemia of chronic disease   . 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 Blindness of both eyes   . Blood in stool   . Cancer [facility])    prostate  . CHF (congestive heart failure) ([facility])    chronic diastolic heart failure  . Chronic kidney disease   . Coronary artery disease    aortic atherosclerosis  . Depression   . Diabetes mellitus without complication ([place])   . Edentulism   . ESRD (end stage renal disease) ([place])    dialysis-[facility] (M<W<F)  . Fecal incontinence   . Glaucoma   . Heart murmur    occasional irreg beat  . History of fall   . History of leg amputation ([place]) 1991   AK left  . Hypertension   . Iritis   . MGUS (monoclonal gammopathy of unknown significance)   . Neuromuscular disorder ([facility])    neuropathy  . Nocturnal cough   . Peripheral vascular disease ([place])   . Pneumonia    hx of  . Protein calorie malnutrition ([place])   . Renal cyst   . Wheelchair bound     HOSPITAL COURSE:   1.  Acute now chronic respiratory failure. The patient's pulse ox has dropped down into the 70s despite diuresis and nebulizer treatments. As per the daughter the patient was on oxygen at home previously. Patient will be set up for home oxygen 2 L nasal cannula continuously. 2. Acute diastolic congestive heart failure and fluid overload. Dialysis to manage fluid. I also started Lasix.  CHF clinic prescribed as outpatient. Patient on lisinopril and Coreg  also. 3. History of COPD.  I started nebulizer treatments and steroids. Lungs do sound better's I will stop steroids at this time. Prescribe Symbicort, albuterol and Spiriva as outpatient. 4. Essential hypertension on lisinopril Norvasc and Coreg 5. End-stage renal disease on dialysis. Patient will have dialysis today prior to disposition 6. Type 2 diabetes mellitus with relative hypoglycemia. Since starting steroids sugars have elevated.  I held glipizide and his Lantus insulin at this point I think it's worthwhile watching off medications at this point.  Sugars will likely be high today while given steroids. Can use sliding scale if sugars high. 7. Elevated troponin demand ischemia from CHF 8. Patient is listed as a partial code do everything except for intubation.  DISCHARGE CONDITIONS:   fair  CONSULTS OBTAINED:  Treatment Team:  Lavonia Dana, MD  DRUG ALLERGIES:  No Known Allergies  DISCHARGE MEDICATIONS:   Current Discharge Medication List    START taking these medications   Details  albuterol (PROVENTIL HFA;VENTOLIN HFA) 108 (90 Base) MCG/ACT inhaler Inhale 2 puffs into the lungs every 6 (six) hours as needed for wheezing or shortness of breath. Qty: 1 Inhaler, Refills: 0    budesonide-formoterol (SYMBICORT) 80-4.5 MCG/ACT inhaler Inhale 2 puffs into the lungs 2 (two) times daily. Qty: 1 Inhaler, Refills: 0    furosemide (LASIX)  80 MG tablet Take 1 tablet (80 mg total) by mouth daily. Qty: 30 tablet, Refills: 0    insulin aspart (NOVOLOG) 100 UNIT/ML injection If sugars over 200- 300 can give 4 units with meal; if sugar over 301 can give 6 units with meal Qty: 10 mL, Refills: 0    Nutritional Supplements (FEEDING SUPPLEMENT, NEPRO CARB STEADY,) LIQD Take 237 mLs by mouth 3 (three) times daily between meals. Qty: 90 Can, Refills: 0    tiotropium (SPIRIVA HANDIHALER) 18 MCG inhalation capsule Place 1 capsule (18 mcg total) into inhaler and inhale daily. Qty: 30  capsule, Refills: 0      CONTINUE these medications which have CHANGED   Details  amLODipine (NORVASC) 10 MG tablet Take 1 tablet (10 mg total) by mouth daily. Qty: 30 tablet, Refills: 0    carvedilol (COREG) 25 MG tablet Take 1 tablet (25 mg total) by mouth 2 (two) times daily with a meal. Qty: 60 tablet, Refills: 0      CONTINUE these medications which have NOT CHANGED   Details  acetaminophen (TYLENOL) 500 MG tablet Take 500-1,000 mg by mouth every 12 (twelve) hours as needed for mild pain or moderate pain.     aspirin EC 81 MG tablet Take 81 mg by mouth daily.    dextromethorphan-guaiFENesin (ROBITUSSIN-DM) 10-100 MG/5ML liquid Take 10 mLs by mouth every 6 (six) hours as needed for cough.    dorzolamide-timolol (COSOPT) 22.3-6.8 MG/ML ophthalmic solution Place 1 drop into the right eye 2 (two) times daily.    gabapentin (NEURONTIN) 400 MG capsule Take 400 mg by mouth daily.     lisinopril (PRINIVIL,ZESTRIL) 40 MG tablet Take 40 mg by mouth daily.     multivitamin (RENA-VIT) TABS tablet Take 1 tablet by mouth daily.    ondansetron (ZOFRAN-ODT) 4 MG disintegrating tablet Take 4 mg by mouth every 8 (eight) hours as needed for nausea or vomiting.    polycarbophil (FIBERCON) 625 MG tablet Take 1,250 mg by mouth daily.    sertraline (ZOLOFT) 25 MG tablet Take 25 mg by mouth daily.    sevelamer (RENAGEL) 800 MG tablet Take 1,600 mg by mouth 3 (three) times daily with meals.      STOP taking these medications     glipiZIDE (GLUCOTROL) 5 MG tablet      hydrALAZINE (APRESOLINE) 25 MG tablet      insulin glargine (LANTUS) 100 UNIT/ML injection      insulin NPH-regular Human (NOVOLIN 70/30) (70-30) 100 UNIT/ML injection          DISCHARGE INSTRUCTIONS:   Follow-up with Dr[facility]urd [facility]  If you experience worsening of your admission symptoms, develop shortness of breath, life threatening emergency, suicidal or homicidal thoughts you must seek medical attention  immediately by calling 911 or calling your MD immediately  if symptoms less severe.  You Must read complete instructions/literature along with all the possible adverse reactions/side effects for all the Medicines you take and that have been prescribed to you. Take any new Medicines after you have completely understood and accept all the possible adverse reactions/side effects.   Please note  You were cared for by a hospitalist during your hospital stay. If you have any questions about your discharge medications or the care you received while you were in the hospital after you are discharged, you can call the unit and asked to speak with the hospitalist on call if the hospitalist that took care of you is not available. Once you  are discharged, your primary care physician will handle any further medical issues. Please note that NO REFILLS for any discharge medications will be authorized once you are discharged, as it is imperative that you return to your primary care physician (or establish a relationship with a primary care physician if you do not have one) for your aftercare needs so that they can reassess your need for medications and monitor your lab values.    Today   CHIEF COMPLAINT:   Chief Complaint  Patient presents with  . Abnormal ECG    HISTORY OF PRESENT ILLNESS:  [name]  is a 82 y.o. male sent in for not feeling well and pulmonary edema   VITAL SIGNS:  Blood pressure (!) 124/58, pulse 80, temperature 98.2 F (36.8 C), temperature source Oral, resp. rate 14, height 5\' 6"  (1.676 m), weight 51.4 kg (113 lb 5.1 oz), SpO2 94 %.    PHYSICAL EXAMINATION:  GENERAL:  82 y.o.-year-old patient lying in the bed with no acute distress.  EYES: Pupils equal, round, reactive to light and accommodation. No scleral icterus. Extraocular muscles intact.  HEENT: Head atraumatic, normocephalic. Oropharynx and nasopharynx clear.  NECK:  Supple, no jugular venous distention. No thyroid  enlargement, no tenderness.  LUNGS: Normal breath sounds bilaterally, no wheezing, rales,rhonchi or crepitation. No use of accessory muscles of respiration.  CARDIOVASCULAR: S1, S2 normal. No murmurs, rubs, or gallops.  ABDOMEN: Soft, non-tender, non-distended. Bowel sounds present. No organomegaly or mass.  EXTREMITIES: No pedal edema, cyanosis, or clubbing.  NEUROLOGIC: Cranial nerves II through XII are intact. Muscle strength 5/5 in all extremities. Sensation intact. Gait not checked.  PSYCHIATRIC: The patient is alert and oriented x 3.  SKIN: No obvious rash, lesion, or ulcer.   DATA REVIEW:   CBC  Recent Labs Lab 12/28/16 1200  WBC 4.0  HGB 9.8*  HCT 31.1*  PLT 262    Chemistries   Recent Labs Lab 12/28/16 1200  NA 130*  K 5.1  CL 92*  CO2 21*  GLUCOSE 306*  BUN 82*  CREATININE 8.45*  CALCIUM 7.0*    Cardiac Enzymes  Recent Labs Lab 12/23/16 0926  TROPONINI 0.08*    Microbiology Results  Results for orders placed or performed during the hospital encounter of 12/22/16  Blood culture (routine x 2)     Status: None   Collection Time: 12/22/16  4:17 PM  Result Value Ref Range Status   Specimen Description BLOOD BLOOD LEFT FOREARM  Final   Special Requests   Final    BOTTLES DRAWN AEROBIC AND ANAEROBIC Blood Culture results may not be optimal due to an excessive volume of blood received in culture bottles   Culture NO GROWTH 5 DAYS  Final   Report Status 12/27/2016 FINAL  Final  Blood culture (routine x 2)     Status: None   Collection Time: 12/22/16  4:23 PM  Result Value Ref Range Status   Specimen Description BLOOD BLOOD LEFT HAND  Final   Special Requests   Final    BOTTLES DRAWN AEROBIC AND ANAEROBIC Blood Culture adequate volume   Culture NO GROWTH 5 DAYS  Final   Report Status 12/27/2016 FINAL  Final    RADIOLOGY:  No results found.  Management plans discussed with the patient, and he is in agreement.  I spoke with the daughter yesterday  evening. Awaiting Dr. Catalina Gravel opinion on home versus placement.  CODE STATUS:     Code Status Orders  Start     Ordered   12/25/16 1852  Limited resuscitation (code)  Continuous    Question Answer Comment  In the event of cardiac or respiratory ARREST: Initiate Code Blue, Call Rapid Response Yes   In the event of cardiac or respiratory ARREST: Perform CPR Yes   In the event of cardiac or respiratory ARREST: Perform Intubation/Mechanical Ventilation No   In the event of cardiac or respiratory ARREST: Use NIPPV/BiPAp only if indicated Yes   In the event of cardiac or respiratory ARREST: Administer ACLS medications if indicated Yes   In the event of cardiac or respiratory ARREST: Perform Defibrillation or Cardioversion if indicated Yes      12/25/16 1852    Code Status History    Date Active Date Inactive Code Status Order ID Comments User Context   12/25/2016  6:48 PM 12/25/2016  6:52 PM Partial Code [id number]  Latanya Maudlin, RN Inpatient   12/22/2016  8:18 PM 12/25/2016  6:48 PM Full Code [id number]  Baxter Hire, MD Inpatient   03/09/2016 10:35 PM 03/11/2016 11:21 PM Full Code [id number]  Harvie Bridge, DO Inpatient   11/10/2014  7:54 PM 11/15/2014  1:12 AM Full Code [id number]  Demetrios Loll, MD ED   10/28/2014  9:19 PM 10/31/2014  3:04 PM Full Code [id number]  Nicholes Mango, MD Inpatient    Advance Directive Documentation     Most Recent Value  Type of Advance Directive  Healthcare Power of [place], Living will  Pre-existing out of facility DNR order (yellow form or pink MOST form)  -  "MOST" Form in Place?  -      TOTAL TIME TAKING CARE OF THIS PATIENT: 35 minutes.    Loletha Grayer M.D on 12/28/2016 at 4:32 PM  Between 7am to 6pm - Pager - [phone]  After 6pm go to [email or web address] - password [email or web address]  [facility] Office  [phone]  CC: Primary care physician; Dr Jerrye Bushy [facility]

## 2017-01-05 ENCOUNTER — Ambulatory Visit: Payer: Medicare (Managed Care) | Admitting: Family

## 2017-01-05 ENCOUNTER — Telehealth: Payer: Self-pay | Admitting: Family

## 2017-01-05 NOTE — Telephone Encounter (Signed)
Patient did not show for his [facility] Failure Clinic appointment on 01/05/17. Will attempt to reschedule.

## 2017-01-18 ENCOUNTER — Emergency Department: Payer: Medicare (Managed Care)

## 2017-01-18 ENCOUNTER — Encounter: Payer: Self-pay | Admitting: Emergency Medicine

## 2017-01-18 ENCOUNTER — Inpatient Hospital Stay
Admission: EM | Admit: 2017-01-18 | Discharge: 2017-01-21 | DRG: 291 | Disposition: A | Discharge status: Skilled Nursing Facility | Payer: Medicare (Managed Care) | Attending: Internal Medicine | Admitting: Internal Medicine

## 2017-01-18 DIAGNOSIS — J90 Pleural effusion, not elsewhere classified: Secondary | ICD-10-CM | POA: Diagnosis not present

## 2017-01-18 DIAGNOSIS — J449 Chronic obstructive pulmonary disease, unspecified: Secondary | ICD-10-CM | POA: Diagnosis present

## 2017-01-18 DIAGNOSIS — H548 Legal blindness, as defined in USA: Secondary | ICD-10-CM | POA: Diagnosis present

## 2017-01-18 DIAGNOSIS — N2581 Secondary hyperparathyroidism of renal origin: Secondary | ICD-10-CM | POA: Diagnosis present

## 2017-01-18 DIAGNOSIS — Z9889 Other specified postprocedural states: Secondary | ICD-10-CM

## 2017-01-18 DIAGNOSIS — Z8701 Personal history of pneumonia (recurrent): Secondary | ICD-10-CM

## 2017-01-18 DIAGNOSIS — E1140 Type 2 diabetes mellitus with diabetic neuropathy, unspecified: Secondary | ICD-10-CM | POA: Diagnosis present

## 2017-01-18 DIAGNOSIS — R7989 Other specified abnormal findings of blood chemistry: Secondary | ICD-10-CM

## 2017-01-18 DIAGNOSIS — Z9981 Dependence on supplemental oxygen: Secondary | ICD-10-CM | POA: Diagnosis not present

## 2017-01-18 DIAGNOSIS — Z7951 Long term (current) use of inhaled steroids: Secondary | ICD-10-CM | POA: Diagnosis not present

## 2017-01-18 DIAGNOSIS — J9621 Acute and chronic respiratory failure with hypoxia: Secondary | ICD-10-CM | POA: Diagnosis present

## 2017-01-18 DIAGNOSIS — J9601 Acute respiratory failure with hypoxia: Secondary | ICD-10-CM | POA: Diagnosis present

## 2017-01-18 DIAGNOSIS — I132 Hypertensive heart and chronic kidney disease with heart failure and with stage 5 chronic kidney disease, or end stage renal disease: Secondary | ICD-10-CM | POA: Diagnosis present

## 2017-01-18 DIAGNOSIS — E1165 Type 2 diabetes mellitus with hyperglycemia: Secondary | ICD-10-CM | POA: Diagnosis present

## 2017-01-18 DIAGNOSIS — Z993 Dependence on wheelchair: Secondary | ICD-10-CM

## 2017-01-18 DIAGNOSIS — Z79899 Other long term (current) drug therapy: Secondary | ICD-10-CM | POA: Diagnosis not present

## 2017-01-18 DIAGNOSIS — R0602 Shortness of breath: Secondary | ICD-10-CM | POA: Diagnosis present

## 2017-01-18 DIAGNOSIS — Z87891 Personal history of nicotine dependence: Secondary | ICD-10-CM

## 2017-01-18 DIAGNOSIS — D631 Anemia in chronic kidney disease: Secondary | ICD-10-CM | POA: Diagnosis present

## 2017-01-18 DIAGNOSIS — J918 Pleural effusion in other conditions classified elsewhere: Secondary | ICD-10-CM | POA: Diagnosis present

## 2017-01-18 DIAGNOSIS — J9690 Respiratory failure, unspecified, unspecified whether with hypoxia or hypercapnia: Secondary | ICD-10-CM

## 2017-01-18 DIAGNOSIS — E1151 Type 2 diabetes mellitus with diabetic peripheral angiopathy without gangrene: Secondary | ICD-10-CM | POA: Diagnosis present

## 2017-01-18 DIAGNOSIS — Z992 Dependence on renal dialysis: Secondary | ICD-10-CM

## 2017-01-18 DIAGNOSIS — Z833 Family history of diabetes mellitus: Secondary | ICD-10-CM

## 2017-01-18 DIAGNOSIS — Z89612 Acquired absence of left leg above knee: Secondary | ICD-10-CM

## 2017-01-18 DIAGNOSIS — E1122 Type 2 diabetes mellitus with diabetic chronic kidney disease: Secondary | ICD-10-CM | POA: Diagnosis present

## 2017-01-18 DIAGNOSIS — Z7982 Long term (current) use of aspirin: Secondary | ICD-10-CM

## 2017-01-18 DIAGNOSIS — R778 Other specified abnormalities of plasma proteins: Secondary | ICD-10-CM

## 2017-01-18 DIAGNOSIS — J9600 Acute respiratory failure, unspecified whether with hypoxia or hypercapnia: Secondary | ICD-10-CM

## 2017-01-18 DIAGNOSIS — I2510 Atherosclerotic heart disease of native coronary artery without angina pectoris: Secondary | ICD-10-CM | POA: Diagnosis present

## 2017-01-18 DIAGNOSIS — N186 End stage renal disease: Secondary | ICD-10-CM | POA: Diagnosis present

## 2017-01-18 DIAGNOSIS — H409 Unspecified glaucoma: Secondary | ICD-10-CM | POA: Diagnosis present

## 2017-01-18 DIAGNOSIS — Z8546 Personal history of malignant neoplasm of prostate: Secondary | ICD-10-CM | POA: Diagnosis not present

## 2017-01-18 DIAGNOSIS — Z9079 Acquired absence of other genital organ(s): Secondary | ICD-10-CM

## 2017-01-18 DIAGNOSIS — I5033 Acute on chronic diastolic (congestive) heart failure: Secondary | ICD-10-CM | POA: Diagnosis present

## 2017-01-18 DIAGNOSIS — Z794 Long term (current) use of insulin: Secondary | ICD-10-CM | POA: Diagnosis not present

## 2017-01-18 LAB — BASIC METABOLIC PANEL
ANION GAP: 15 (ref 5–15)
BUN: 27 mg/dL — AB (ref 6–20)
CO2: 29 mmol/L (ref 22–32)
Calcium: 7.6 mg/dL — ABNORMAL LOW (ref 8.9–10.3)
Chloride: 95 mmol/L — ABNORMAL LOW (ref 101–111)
Creatinine, Ser: 3.63 mg/dL — ABNORMAL HIGH (ref 0.61–1.24)
GFR calc Af Amer: 17 mL/min — ABNORMAL LOW (ref >60)
GFR calc non Af Amer: 15 mL/min — ABNORMAL LOW (ref >60)
GLUCOSE: 186 mg/dL — AB (ref 65–99)
POTASSIUM: 3.4 mmol/L — AB (ref 3.5–5.1)
SODIUM: 139 mmol/L (ref 135–145)

## 2017-01-18 LAB — CBC WITH DIFFERENTIAL/PLATELET
BASOS ABS: 0 10*3/uL (ref 0–0.1)
Basophils Relative: 1 %
EOS PCT: 2 %
Eosinophils Absolute: 0.1 10*3/uL (ref 0–0.7)
HEMATOCRIT: 32.1 % — AB (ref 40.0–52.0)
Hemoglobin: 10.1 g/dL — ABNORMAL LOW (ref 13.0–18.0)
LYMPHS PCT: 4 %
Lymphs Abs: 0.3 10*3/uL — ABNORMAL LOW (ref 1.0–3.6)
MCH: 20.6 pg — ABNORMAL LOW (ref 26.0–34.0)
MCHC: 31.5 g/dL — ABNORMAL LOW (ref 32.0–36.0)
MCV: 65.3 fL — AB (ref 80.0–100.0)
Monocytes Absolute: 0.7 10*3/uL (ref 0.2–1.0)
Monocytes Relative: 11 %
Neutro Abs: 5.2 10*3/uL (ref 1.4–6.5)
Neutrophils Relative %: 82 %
PLATELETS: 384 10*3/uL (ref 150–440)
RBC: 4.92 MIL/uL (ref 4.40–5.90)
RDW: 21.5 % — ABNORMAL HIGH (ref 11.5–14.5)
WBC: 6.2 10*3/uL (ref 3.8–10.6)

## 2017-01-18 LAB — BLOOD GAS, ARTERIAL
Acid-Base Excess: 7.6 mmol/L — ABNORMAL HIGH (ref 0.0–2.0)
Bicarbonate: 33.1 mmol/L — ABNORMAL HIGH (ref 20.0–28.0)
FIO2: 0.95
O2 Saturation: 92.8 %
PCO2 ART: 51 mmHg — AB (ref 32.0–48.0)
PH ART: 7.42 (ref 7.350–7.450)
Patient temperature: 37
pO2, Arterial: 65 mmHg — ABNORMAL LOW (ref 83.0–108.0)

## 2017-01-18 LAB — GLUCOSE, CAPILLARY: Glucose-Capillary: 295 mg/dL — ABNORMAL HIGH (ref 65–99)

## 2017-01-18 LAB — HEPATIC FUNCTION PANEL
ALK PHOS: 121 U/L (ref 38–126)
ALT: 22 U/L (ref 17–63)
AST: 43 U/L — AB (ref 15–41)
Albumin: 3.4 g/dL — ABNORMAL LOW (ref 3.5–5.0)
BILIRUBIN INDIRECT: 0.8 mg/dL (ref 0.3–0.9)
Bilirubin, Direct: 0.3 mg/dL (ref 0.1–0.5)
TOTAL PROTEIN: 7.4 g/dL (ref 6.5–8.1)
Total Bilirubin: 1.1 mg/dL (ref 0.3–1.2)

## 2017-01-18 LAB — INFLUENZA PANEL BY PCR (TYPE A & B)
INFLAPCR: NEGATIVE
Influenza B By PCR: NEGATIVE

## 2017-01-18 LAB — BRAIN NATRIURETIC PEPTIDE: B Natriuretic Peptide: 2771 pg/mL — ABNORMAL HIGH (ref 0.0–100.0)

## 2017-01-18 LAB — TROPONIN I: Troponin I: 0.1 ng/mL (ref <0.03)

## 2017-01-18 LAB — MRSA PCR SCREENING: MRSA BY PCR: NEGATIVE

## 2017-01-18 MED ORDER — MORPHINE SULFATE (PF) 2 MG/ML IV SOLN
2.0000 mg | INTRAVENOUS | Status: DC | PRN
  Administered 2017-01-18 (×2): 2 mg via INTRAVENOUS
  Filled 2017-01-18 (×3): qty 1

## 2017-01-18 MED ORDER — HEPARIN SODIUM (PORCINE) 5000 UNIT/ML IJ SOLN
5000.0000 [IU] | Freq: Three times a day (TID) | INTRAMUSCULAR | Status: DC
  Administered 2017-01-18 – 2017-01-20 (×6): 5000 [IU] via SUBCUTANEOUS
  Filled 2017-01-18 (×6): qty 1

## 2017-01-18 MED ORDER — SERTRALINE HCL 25 MG PO TABS
25.0000 mg | ORAL_TABLET | Freq: Every day | ORAL | Status: DC
  Administered 2017-01-18 – 2017-01-21 (×4): 25 mg via ORAL
  Filled 2017-01-18 (×4): qty 1

## 2017-01-18 MED ORDER — RENA-VITE PO TABS
1.0000 | ORAL_TABLET | Freq: Every day | ORAL | Status: DC
  Administered 2017-01-18 – 2017-01-21 (×4): 1 via ORAL
  Filled 2017-01-18 (×4): qty 1

## 2017-01-18 MED ORDER — METHYLPREDNISOLONE SODIUM SUCC 125 MG IJ SOLR
125.0000 mg | INTRAMUSCULAR | Status: AC
  Administered 2017-01-18: 125 mg via INTRAVENOUS

## 2017-01-18 MED ORDER — GABAPENTIN 400 MG PO CAPS
400.0000 mg | ORAL_CAPSULE | Freq: Every day | ORAL | Status: DC
  Administered 2017-01-18 – 2017-01-21 (×4): 400 mg via ORAL
  Filled 2017-01-18 (×4): qty 1

## 2017-01-18 MED ORDER — HYDROCODONE-ACETAMINOPHEN 5-325 MG PO TABS: 1.0000 | ORAL_TABLET | ORAL | Status: DC | PRN

## 2017-01-18 MED ORDER — TIOTROPIUM BROMIDE MONOHYDRATE 18 MCG IN CAPS
18.0000 ug | ORAL_CAPSULE | Freq: Every day | RESPIRATORY_TRACT | Status: DC
  Administered 2017-01-18 – 2017-01-21 (×4): 18 ug via RESPIRATORY_TRACT
  Filled 2017-01-18 (×2): qty 5

## 2017-01-18 MED ORDER — GUAIFENESIN-CODEINE 100-10 MG/5ML PO SOLN
10.0000 mL | ORAL | Status: DC | PRN
  Administered 2017-01-18: 10 mL via ORAL
  Filled 2017-01-18: qty 10

## 2017-01-18 MED ORDER — LISINOPRIL 20 MG PO TABS
40.0000 mg | ORAL_TABLET | Freq: Every day | ORAL | Status: DC
  Administered 2017-01-18 – 2017-01-21 (×4): 40 mg via ORAL
  Filled 2017-01-18 (×4): qty 2

## 2017-01-18 MED ORDER — DOCUSATE SODIUM 100 MG PO CAPS: 100.0000 mg | ORAL_CAPSULE | Freq: Two times a day (BID) | ORAL | Status: DC | PRN

## 2017-01-18 MED ORDER — HYDROXYZINE HCL 50 MG PO TABS
50.0000 mg | ORAL_TABLET | Freq: Three times a day (TID) | ORAL | Status: DC | PRN
  Administered 2017-01-18: 50 mg via ORAL
  Filled 2017-01-18: qty 2
  Filled 2017-01-18: qty 1

## 2017-01-18 MED ORDER — CARVEDILOL 25 MG PO TABS
25.0000 mg | ORAL_TABLET | Freq: Two times a day (BID) | ORAL | Status: DC
  Administered 2017-01-19 – 2017-01-21 (×4): 25 mg via ORAL
  Filled 2017-01-18 (×4): qty 1

## 2017-01-18 MED ORDER — HYDRALAZINE HCL 20 MG/ML IJ SOLN
10.0000 mg | Freq: Four times a day (QID) | INTRAMUSCULAR | Status: DC | PRN
  Administered 2017-01-18: 10 mg via INTRAVENOUS
  Filled 2017-01-18: qty 1

## 2017-01-18 MED ORDER — MOMETASONE FURO-FORMOTEROL FUM 100-5 MCG/ACT IN AERO
2.0000 | INHALATION_SPRAY | Freq: Two times a day (BID) | RESPIRATORY_TRACT | Status: DC
  Administered 2017-01-18 – 2017-01-21 (×6): 2 via RESPIRATORY_TRACT
  Filled 2017-01-18 (×2): qty 8.8

## 2017-01-18 MED ORDER — FUROSEMIDE 40 MG PO TABS
80.0000 mg | ORAL_TABLET | Freq: Every day | ORAL | Status: DC
  Administered 2017-01-18 – 2017-01-21 (×3): 80 mg via ORAL
  Filled 2017-01-18 (×2): qty 2
  Filled 2017-01-18: qty 4

## 2017-01-18 MED ORDER — AMLODIPINE BESYLATE 10 MG PO TABS
10.0000 mg | ORAL_TABLET | Freq: Every day | ORAL | Status: DC
  Administered 2017-01-18 – 2017-01-21 (×4): 10 mg via ORAL
  Filled 2017-01-18 (×4): qty 1

## 2017-01-18 MED ORDER — NEPRO/CARBSTEADY PO LIQD
237.0000 mL | Freq: Three times a day (TID) | ORAL | Status: DC
  Administered 2017-01-19 – 2017-01-21 (×4): 237 mL via ORAL

## 2017-01-18 MED ORDER — HYDRALAZINE HCL 20 MG/ML IJ SOLN
10.0000 mg | INTRAMUSCULAR | Status: DC | PRN
  Administered 2017-01-19: 10 mg via INTRAVENOUS
  Filled 2017-01-18: qty 1

## 2017-01-18 MED ORDER — METHYLPREDNISOLONE SODIUM SUCC 125 MG IJ SOLR
INTRAMUSCULAR | Status: AC
  Administered 2017-01-18: 125 mg via INTRAVENOUS
  Filled 2017-01-18: qty 2

## 2017-01-18 MED ORDER — SEVELAMER CARBONATE 800 MG PO TABS
800.0000 mg | ORAL_TABLET | Freq: Three times a day (TID) | ORAL | Status: DC
  Administered 2017-01-19 – 2017-01-21 (×5): 800 mg via ORAL
  Filled 2017-01-18 (×7): qty 1

## 2017-01-18 MED ORDER — ASPIRIN EC 81 MG PO TBEC
81.0000 mg | DELAYED_RELEASE_TABLET | Freq: Every day | ORAL | Status: DC
  Administered 2017-01-18 – 2017-01-21 (×4): 81 mg via ORAL
  Filled 2017-01-18 (×4): qty 1

## 2017-01-18 NOTE — ED Triage Notes (Addendum)
Pt to ED via EMS from dialysis center, pt was dialyzed and was sent here for weakness and low o2 sats, 93 % on 4L per EMS. Pt A&Ox4, MD at bedside . Pt blind in both eyes.

## 2017-01-18 NOTE — ED Provider Notes (Signed)
[facility] Emergency Department Provider Note  ____________________________________________   First MD Initiated Contact with Patient 01/18/17 1557     (approximate)  I have reviewed the triage vital signs and the nursing notes.   HISTORY  Chief Complaint Weakness    HPI [name] is a 82 y.o. male is brought to the emergency department via EMS for cough shortness of breath and hypoxia following dialysis today. The patient has a complex past medical history including congestive heart failure, diabetes mellitus, end-stage renal disease and his dialyzes file a fistula in his right upper extremity Monday Wednesday Friday. He has apparently had an increasingly productive cough for the past several days. His symptoms have been insidious onset and slowly progressive. Nothing in particular seems to make it better or worse.   12/27/16 Echo: Study Conclusions  - Left ventricle: The cavity size was normal. Systolic function was   normal. The estimated ejection fraction was in the range of 55%   to 60%. Wall motion was normal; there were no regional wall   motion abnormalities. Features are consistent with a pseudonormal   left ventricular filling pattern, with concomitant abnormal   relaxation and increased filling pressure (grade 2 diastolic   dysfunction). - Aortic valve: There was trivial regurgitation. - Aortic root: The aortic root was mildly dilated. - Left atrium: The atrium was normal in size. - Right ventricle: Systolic function was normal. - Pulmonary arteries: Systolic pressure was within the normal   range.   Past Medical History:  Diagnosis Date  . Anemia of chronic disease   . 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 Blindness of both eyes   . Blood in stool   . Cancer [facility])    prostate  . CHF (congestive heart failure) (HCC)    chronic diastolic heart failure  . Chronic kidney disease   . Coronary artery disease     aortic atherosclerosis  . Depression   . Diabetes mellitus without complication ([place])   . Edentulism   . ESRD (end stage renal disease) ([place])    dialysis-[place] (M<W<F)  . Fecal incontinence   . Glaucoma   . Heart murmur    occasional irreg beat  . History of fall   . History of leg amputation ([place]) 1991   AK left  . Hypertension   . Iritis   . MGUS (monoclonal gammopathy of unknown significance)   . Neuromuscular disorder (HCC)    neuropathy  . Nocturnal cough   . Peripheral vascular disease ([place])   . Pneumonia    hx of  . Protein calorie malnutrition ([place])   . Renal cyst   . Wheelchair bound     Patient Active Problem List   Diagnosis Date Noted  . Acute respiratory failure with hypoxia ([place]) 12/26/2016  . Congestive heart failure (CHF) ([place]) 12/22/2016  . Acute exacerbation of congestive heart failure ([place]) 03/09/2016  . Pneumonia 11/10/2014  . Hypoxia 11/10/2014  . Fluid overload 11/10/2014  . Elevated troponin 11/10/2014  . Healthcare-associated pneumonia 10/28/2014    Past Surgical History:  Procedure Laterality Date  . dialysis port Right    upper arm  . ESOPHAGOGASTRODUODENOSCOPY (EGD) WITH PROPOFOL N/A 04/18/2015   Procedure: ESOPHAGOGASTRODUODENOSCOPY (EGD) WITH PROPOFOL;  Surgeon: Hulen Luster, MD;  Location: [facility] ENDOSCOPY;  Service: Gastroenterology;  Laterality: N/A;  . EYE SURGERY    . LEG AMPUTATION ABOVE KNEE  left  . PHOTOCOAGULATION WITH LASER Right 07/07/2016  Procedure: PHOTOCOAGULATION WITH LASER  Right;  Surgeon: Eulogio Bear, MD;  Location: [facility];  Service: Ophthalmology;  Laterality: Right;  IVA BLOCk  Diabetic Dialysis Pt - needs Potassium draw   . PROSTATECTOMY      Prior to Admission medications   Medication Sig Start Date End Date Taking? Authorizing Provider  acetaminophen (TYLENOL) 500 MG tablet Take 500-1,000 mg by mouth every 12 (twelve) hours as needed for mild pain or moderate pain.     [provider]  albuterol (PROVENTIL HFA;VENTOLIN HFA) 108 (90 Base) MCG/ACT inhaler Inhale 2 puffs into the lungs every 6 (six) hours as needed for wheezing or shortness of breath. 12/28/16   Loletha Grayer, MD  amLODipine (NORVASC) 10 MG tablet Take 1 tablet (10 mg total) by mouth daily. 12/28/16   Loletha Grayer, MD  aspirin EC 81 MG tablet Take 81 mg by mouth daily.    [provider]  budesonide-formoterol (SYMBICORT) 80-4.5 MCG/ACT inhaler Inhale 2 puffs into the lungs 2 (two) times daily. 12/28/16   Loletha Grayer, MD  carvedilol (COREG) 25 MG tablet Take 1 tablet (25 mg total) by mouth 2 (two) times daily with a meal. 12/28/16   Leslye Peer, Richard, MD  dextromethorphan-guaiFENesin (ROBITUSSIN-DM) 10-100 MG/5ML liquid Take 10 mLs by mouth every 6 (six) hours as needed for cough.    [provider]  dorzolamide-timolol (COSOPT) 22.3-6.8 MG/ML ophthalmic solution Place 1 drop into the right eye 2 (two) times daily.    [provider]  furosemide (LASIX) 80 MG tablet Take 1 tablet (80 mg total) by mouth daily. 12/28/16   Loletha Grayer, MD  gabapentin (NEURONTIN) 400 MG capsule Take 400 mg by mouth daily.     [provider]  insulin aspart (NOVOLOG) 100 UNIT/ML injection If sugars over 200- 300 can give 4 units with meal; if sugar over 301 can give 6 units with meal 12/28/16   Wieting, Richard, MD  lisinopril (PRINIVIL,ZESTRIL) 40 MG tablet Take 40 mg by mouth daily.     [provider]  multivitamin (RENA-VIT) TABS tablet Take 1 tablet by mouth daily.    [provider]  Nutritional Supplements (FEEDING SUPPLEMENT, NEPRO CARB STEADY,) LIQD Take 237 mLs by mouth 3 (three) times daily between meals. 12/28/16   Loletha Grayer, MD  ondansetron (ZOFRAN-ODT) 4 MG disintegrating tablet Take 4 mg by mouth every 8 (eight) hours as needed for nausea or vomiting.    [provider]  polycarbophil (FIBERCON) 625 MG tablet Take 1,250 mg by mouth  daily.    [provider]  sertraline (ZOLOFT) 25 MG tablet Take 25 mg by mouth daily.    [provider]  sevelamer (RENAGEL) 800 MG tablet Take 1,600 mg by mouth 3 (three) times daily with meals.    [provider]  tiotropium (SPIRIVA HANDIHALER) 18 MCG inhalation capsule Place 1 capsule (18 mcg total) into inhaler and inhale daily. 12/28/16 12/28/17  Loletha Grayer, MD    Allergies Patient has no known allergies.  Family History  Problem Relation Age of Onset  . Diabetes Mother   . Diabetes Father   . Diabetes Sister   . Diabetes Brother     Social History Social History  Substance Use Topics  . Smoking status: Former Smoker    Quit date: 03/09/1990  . Smokeless tobacco: Never Used     Comment: Doesn't recall details  . Alcohol use No    Review of Systems Constitutional: No fever/chills Eyes: No  visual changes. ENT: No sore throat. Cardiovascular: Denies chest pain. Respiratory: positive for shortness of breath. Gastrointestinal: No abdominal pain.  No nausea, no vomiting.  No diarrhea.  No constipation. Genitourinary: Negative for dysuria. Musculoskeletal: Negative for back pain. Skin: Negative for rash. Neurological: Negative for headaches, focal weakness or numbness.   ____________________________________________   PHYSICAL EXAM:  VITAL SIGNS: ED Triage Vitals [01/18/17 1553]  Enc Vitals Group     BP      Pulse      Resp      Temp      Temp src      SpO2      Weight      Height      Head Circumference      Peak Flow      Pain Score 0     Pain Loc      Pain Edu?      Excl. in [place]?     Constitutional: alert and oriented 4 appears quite short of breath speaking in 2 word gasps Eyes: PERRL EOMI. Head: Atraumatic. Nose: No congestion/rhinnorhea. Mouth/Throat: No trismus Neck: No stridor.   Cardiovascular: Normal rate, regular rhythm. Grossly normal heart sounds.  Good peripheral circulation. Respiratory: increased  respiratory effort using accessory muscles with decreased breath sounds bilaterally and crackles throughout Gastrointestinal: soft nontender Musculoskeletal: fistula to right upper extremity with good thrill  Neurologic:  Normal speech and language. No gross focal neurologic deficits are appreciated. Skin:  Skin is warm, dry and intact. No rash noted. Psychiatric: Mood and affect are normal.     ____________________________________________   DIFFERENTIAL includes but not limited to  pulmonary edema, pneumonia, pleural effusion, pulmonary embolism ____________________________________________   LABS (all labs ordered are listed, but only abnormal results are displayed)  Labs Reviewed  BASIC METABOLIC PANEL - Abnormal; Notable for the following:       Result Value   Potassium 3.4 (*)    Chloride 95 (*)    Glucose, Bld 186 (*)    BUN 27 (*)    Creatinine, Ser 3.63 (*)    Calcium 7.6 (*)    GFR calc non Af Amer 15 (*)    GFR calc Af Amer 17 (*)    All other components within normal limits  HEPATIC FUNCTION PANEL - Abnormal; Notable for the following:    Albumin 3.4 (*)    AST 43 (*)    All other components within normal limits  CBC WITH DIFFERENTIAL/PLATELET - Abnormal; Notable for the following:    Hemoglobin 10.1 (*)    HCT 32.1 (*)    MCV 65.3 (*)    MCH 20.6 (*)    MCHC 31.5 (*)    RDW 21.5 (*)    Lymphs Abs 0.3 (*)    All other components within normal limits  TROPONIN I - Abnormal; Notable for the following:    Troponin I 0.10 (*)    All other components within normal limits  INFLUENZA PANEL BY PCR (TYPE A & B)  BRAIN NATRIURETIC PEPTIDE    blood work reviewed and interpreted by me shows elevated troponin although in the setting of end-stage renal disease is nonspecific __________________________________________  EKG  ED ECG REPORT I, Darel Hong, the attending physician, personally viewed and interpreted this ECG.  Date: 01/18/2017 EKG Time:  Rate:  89 Rhythm: normal sinus rhythm QRS Axis: normal Intervals: normal ST/T Wave abnormalities: T-wave inversion V4 V5 V6 which are consistent with previous EKG done 12/22/2016 Narrative Interpretation:  no evidence of acute ischemia  ____________________________________________  RADIOLOGY  chest x-ray reviewed by me concerning for worsening pleural effusion on the left greater than right ____________________________________________   PROCEDURES  Procedure(s) performed: no  Procedures  Critical Care performed: yes  CRITICAL CARE Performed by: Darel Hong   Total critical care time: 30 minutes  Critical care time was exclusive of separately billable procedures and treating other patients.  Critical care was necessary to treat or prevent imminent or life-threatening deterioration.  Critical care was time spent personally by me on the following activities: development of treatment plan with patient and/or surrogate as well as nursing, discussions with consultants, evaluation of patient's response to treatment, examination of patient, obtaining history from patient or surrogate, ordering and performing treatments and interventions, ordering and review of laboratory studies, ordering and review of radiographic studies, pulse oximetry and re-evaluation of patient's condition.   Observation: no ____________________________________________   INITIAL IMPRESSION / ASSESSMENT AND PLAN / ED COURSE  Pertinent labs & imaging results that were available during my care of the patient were reviewed by me and considered in my medical decision making (see chart for details).  The patient arrives hemodynamically stable although with a white sounding cough. His core temperature his normal so pneumonia is less likely. X-ray and labs are pending.      __----------------------------------------- 4:57 PM on 01/18/2017 -----------------------------------------  The patient desaturates to 82%  immediately after oxygen is removed although he comes up with a facemask. He does have worsening of his chronic pleural effusions which may be contributing to his symptoms. Regardless given his oxygen requirements she will require inpatient admission for likely interventional radiology guided thoracentesis as well as oxygen supplementation.__________________________________________   FINAL CLINICAL IMPRESSION(S) / ED DIAGNOSES  Final diagnoses:  Shortness of breath  Elevated troponin  Pleural effusion      NEW MEDICATIONS STARTED DURING THIS VISIT:  New Prescriptions   No medications on file     Note:  This document was prepared using Dragon voice recognition software and may include unintentional dictation errors.     Darel Hong, MD 01/18/17 1700

## 2017-01-18 NOTE — ED Notes (Signed)
Date and time results received: 01/18/17 1635 (use smartphrase ".now" to insert current time)  Test: toponin Critical Value: 0.10  Name of Provider Notified: [place]

## 2017-01-18 NOTE — Consult Note (Signed)
PULMONARY / CRITICAL CARE MEDICINE   Name: [name] MRN: [id number] DOB: 1935/02/25    ADMISSION DATE:  01/18/2017   CONSULTATION DATE:  01/18/17  REFERRING MD:  Dr Jannifer [name]  REASON: Acute respiratory failure  CHIEF COMPLAINT:  dyspnea  HISTORY OF PRESENT ILLNESS:   82 y/o AA male with a PMH as indicated below, had HD today, presented to the ED with acute dyspnea. CXR showed pulmonary edema and bilateral pleural effusions. Initially admitted to the floor but then transferred to the ICU for worsening dyspnea and hypoxia. Patient is screaming and asking for O2 NRM to be taken off. Now placed on HFNC. He is coughing profusely . 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 ([place]); Blindness of both eyes; Blood in stool; Cancer [facility]); CHF (congestive heart failure) ([place]); Chronic kidney disease; Coronary artery disease; Depression; Diabetes mellitus without complication ([place]); Edentulism; ESRD (end stage renal disease) ([place]); Fecal incontinence; Glaucoma; Heart murmur; History of fall; History of leg amputation ([place]) (1991); Hypertension; Iritis; MGUS (monoclonal gammopathy of unknown significance); Neuromuscular disorder ([place]); Nocturnal cough; Peripheral vascular disease ([place]); Pneumonia; Protein calorie malnutrition ([place]); Renal cyst; and Wheelchair bound.  PAST SURGICAL HISTORY: He  has a past surgical history that includes Leg amputation above knee (left); Prostatectomy; Esophagogastroduodenoscopy (egd) with propofol (N/A, 04/18/2015); Eye surgery; dialysis port (Right); and Photocoagulation with laser (Right, 07/07/2016).  No Known Allergies  No current facility-administered medications on file prior to encounter.    Current Outpatient Prescriptions on File Prior to Encounter  Medication Sig  . acetaminophen (TYLENOL) 500 MG tablet Take 500-1,000 mg by mouth every 12 (twelve) hours as needed for  mild pain or moderate pain.   Marland Kitchen albuterol (PROVENTIL HFA;VENTOLIN HFA) 108 (90 Base) MCG/ACT inhaler Inhale 2 puffs into the lungs every 6 (six) hours as needed for wheezing or shortness of breath.  Marland Kitchen amLODipine (NORVASC) 10 MG tablet Take 1 tablet (10 mg total) by mouth daily.  Marland Kitchen aspirin EC 81 MG tablet Take 81 mg by mouth daily.  . budesonide-formoterol (SYMBICORT) 80-4.5 MCG/ACT inhaler Inhale 2 puffs into the lungs 2 (two) times daily.  . carvedilol (COREG) 25 MG tablet Take 1 tablet (25 mg total) by mouth 2 (two) times daily with a meal.  . dextromethorphan-guaiFENesin (ROBITUSSIN-DM) 10-100 MG/5ML liquid Take 10 mLs by mouth every 6 (six) hours as needed for cough.  . dorzolamide-timolol (COSOPT) 22.3-6.8 MG/ML ophthalmic solution Place 1 drop into the right eye 2 (two) times daily.  . furosemide (LASIX) 80 MG tablet Take 1 tablet (80 mg total) by mouth daily.  Marland Kitchen gabapentin (NEURONTIN) 400 MG capsule Take 400 mg by mouth daily.   . insulin aspart (NOVOLOG) 100 UNIT/ML injection If sugars over 200- 300 can give 4 units with meal; if sugar over 301 can give 6 units with meal  . lisinopril (PRINIVIL,ZESTRIL) 40 MG tablet Take 40 mg by mouth daily.   . multivitamin (RENA-VIT) TABS tablet Take 1 tablet by mouth daily.  . Nutritional Supplements (FEEDING SUPPLEMENT, NEPRO CARB STEADY,) LIQD Take 237 mLs by mouth 3 (three) times daily between meals.  . ondansetron (ZOFRAN-ODT) 4 MG disintegrating tablet Take 4 mg by mouth every 8 (eight) hours as needed for nausea or vomiting.  . polycarbophil (FIBERCON) 625 MG tablet Take 1,250 mg by mouth daily.  . sertraline (ZOLOFT) 25 MG tablet Take 25 mg by mouth daily.  . sevelamer (RENAGEL) 800  MG tablet Take 1,600 mg by mouth 3 (three) times daily with meals.  . tiotropium (SPIRIVA HANDIHALER) 18 MCG inhalation capsule Place 1 capsule (18 mcg total) into inhaler and inhale daily.    FAMILY HISTORY:  His indicated that the status of his mother is  unknown. He indicated that the status of his father is unknown. He indicated that the status of his sister is unknown. He indicated that the status of his brother is unknown.    SOCIAL HISTORY: He  reports that he quit smoking about 26 years ago. He has never used smokeless tobacco. He reports that he does not drink alcohol or use drugs.  REVIEW OF SYSTEMS:   Unable to obtain as patient is screaming  SUBJECTIVE:   VITAL SIGNS: BP (!) 165/73   Pulse 91   Temp 98.1 F (36.7 C) (Axillary)   Resp (!) 22   SpO2 100%   HEMODYNAMICS:    VENTILATOR SETTINGS: FiO2 (%):  [50 %-100 %] 100 %  INTAKE / OUTPUT: No intake/output data recorded.  PHYSICAL EXAMINATION: General: Respiratory distress Neuro: AAO X1, speech is normal, moves all extremities HEENT: PERRLA, +JVD Cardiovascular:  Tachycardic, regular, S1/S2, no MRG Lungs: CTAB Abdomen:  Soft, NT/ND, +BS X4 Musculoskeletal:  Lfet AKA Skin:  Warm and dry  LABS:  BMET  Recent Labs Lab 01/18/17 1604  NA 139  K 3.4*  CL 95*  CO2 29  BUN 27*  CREATININE 3.63*  GLUCOSE 186*    Electrolytes  Recent Labs Lab 01/18/17 1604  CALCIUM 7.6*    CBC  Recent Labs Lab 01/18/17 1604  WBC 6.2  HGB 10.1*  HCT 32.1*  PLT 384    Coag's No results for input(s): APTT, INR in the last 168 hours.  Sepsis Markers No results for input(s): LATICACIDVEN, PROCALCITON, O2SATVEN in the last 168 hours.  ABG  Recent Labs Lab 01/18/17 2205  PHART 7.42  PCO2ART 51*  PO2ART 65*    Liver Enzymes  Recent Labs Lab 01/18/17 1604  AST 43*  ALT 22  ALKPHOS 121  BILITOT 1.1  ALBUMIN 3.4*    Cardiac Enzymes  Recent Labs Lab 01/18/17 1604  TROPONINI 0.10*    Glucose  Recent Labs Lab 01/18/17 2222  GLUCAP 295*    Imaging Dg Chest Port 1 View  Result Date: 01/18/2017 CLINICAL DATA:  Weakness and decreased oxygen saturation at dialysis today. EXAM: PORTABLE CHEST 1 VIEW COMPARISON:  Single-view of the  chest 12/25/2016. CT chest 12/26/2016. FINDINGS: Moderately large left and small right pleural effusion are again seen. Pulmonary edema is also again seen and not notably changed. Cardiomegaly noted. Aortic atherosclerosis is seen. IMPRESSION: No change in pulmonary edema and left much greater than right pleural effusions. Electronically Signed   By: Inge Rise M.D.   On: 01/18/2017 16:12    STUDIES:  none  CULTURES: none  ANTIBIOTICS: none SIGNIFICANT EVENTS: 10/22.admitted  LINES/TUBES: PIVs  DISCUSSION: 22 y/oe male presenting with worsening respiratory failure secondary to BL pleural effusions and pulmonary edema  ASSESSMENT / PLAN: Acute hypoxic respiratoy failure Bilateral pleural effusions Pulmonary edema Coughing spells-?bronchospasm?  ESRD on HD H/O Hypertension  Supplemental O2 via HFNC IR and nephrology consulted for thora and HD respectively Morphine prn for dysnea Solu-medrol 125mg  IV x1 Dulera and prn duoneb Continue all home medications  FAMILY  - Updates: no family at bedside . Will update when available.   - Inter-disciplinary family meet or Palliative Care meeting due by:  day 7  [facility] ANP-BC Pulmonary and [place] Pager [phone] or [phone]  01/18/2017, 11:12 PM   PCCM ATTENDING ATTESTATION:  I have evaluated patient with the APP [place], reviewed database in its entirety and discussed care plan in detail. In addition, this patient was discussed on multidisciplinary rounds.   Important exam findings: Just underwent L thoracentesis with 2 liters removed NAD on [place] O2 Chest clear anteriorly Regular, no M NABS, soft No ankle or pedal edema  Major problems addressed by PCCM team: Acute hypoxemic respiratory failure Recurrent large left pleural effusion - previously consistent with transudate ESRD Hyperglycemia DO NOT INTUBATE  PLAN/REC: As above SSI protocol  initiated Transfer to MedSurg floor  After transfer, PCCM service will follow up on pleural fluid results and offer further recommendations regarding management of recurrent left pleural effusion    Merton Border, MD PCCM service Mobile [phone] Pager [phone] 01/19/2017 2:28 PM

## 2017-01-18 NOTE — ED Notes (Signed)
Pt changed of urine and and stool at this time , pt clean and dry

## 2017-01-18 NOTE — H&P (Signed)
[place] [facility] [place] NAME: [name]    MR#:  [id number]  DATE OF BIRTH:  1935-03-18  DATE OF ADMISSION:  01/18/2017  PRIMARY CARE PHYSICIAN: Rinaldo Cloud, MD   REQUESTING/REFERRING PHYSICIAN: Rifenbark  CHIEF COMPLAINT:   Chief Complaint  Patient presents with  . Weakness    HISTORY OF PRESENT ILLNESS: [name]  is a 82 y.o. male with a known history of CHF, ESRD on HD, Htn, Prostate cancer- was on Home oxygen, felt increasingly SOB. Today had Full HD, still feels SOB- so came here. Noted O2 sats in 80's on 2 ltr, b/l pleural effusion, more on left. Given for admission. Denies any cough or fever.  PAST MEDICAL HISTORY:   Past Medical History:  Diagnosis Date  . Anemia of chronic disease   . 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 Blindness of both eyes   . Blood in stool   . Cancer [facility])    prostate  . CHF (congestive heart failure) (HCC)    chronic diastolic heart failure  . Chronic kidney disease   . Coronary artery disease    aortic atherosclerosis  . Depression   . Diabetes mellitus without complication ([place])   . Edentulism   . ESRD (end stage renal disease) ([place])    dialysis-[place] (M<W<F)  . Fecal incontinence   . Glaucoma   . Heart murmur    occasional irreg beat  . History of fall   . History of leg amputation ([place]) 1991   AK left  . Hypertension   . Iritis   . MGUS (monoclonal gammopathy of unknown significance)   . Neuromuscular disorder (HCC)    neuropathy  . Nocturnal cough   . Peripheral vascular disease ([place])   . Pneumonia    hx of  . Protein calorie malnutrition ([place])   . Renal cyst   . Wheelchair bound     PAST SURGICAL HISTORY: Past Surgical History:  Procedure Laterality Date  . dialysis port Right    upper arm  . ESOPHAGOGASTRODUODENOSCOPY (EGD) WITH PROPOFOL N/A 04/18/2015   Procedure: ESOPHAGOGASTRODUODENOSCOPY (EGD) WITH PROPOFOL;  Surgeon: Hulen Luster, MD;   Location: [facility] ENDOSCOPY;  Service: Gastroenterology;  Laterality: N/A;  . EYE SURGERY    . LEG AMPUTATION ABOVE KNEE  left  . PHOTOCOAGULATION WITH LASER Right 07/07/2016   Procedure: PHOTOCOAGULATION WITH LASER  Right;  Surgeon: Eulogio Bear, MD;  Location: [place];  Service: Ophthalmology;  Laterality: Right;  IVA BLOCk  Diabetic Dialysis Pt - needs Potassium draw   . PROSTATECTOMY      SOCIAL HISTORY:  Social History  Substance Use Topics  . Smoking status: Former Smoker    Quit date: 03/09/1990  . Smokeless tobacco: Never Used     Comment: Doesn't recall details  . Alcohol use No    FAMILY HISTORY:  Family History  Problem Relation Age of Onset  . Diabetes Mother   . Diabetes Father   . Diabetes Sister   . Diabetes Brother     DRUG ALLERGIES: No Known Allergies  REVIEW OF SYSTEMS:   CONSTITUTIONAL: No fever, fatigue or weakness.  EYES: No blurred or double vision.  EARS, NOSE, AND THROAT: No tinnitus or ear pain.  RESPIRATORY: No cough, have shortness of breath, no wheezing or hemoptysis.  CARDIOVASCULAR: No chest pain, orthopnea, edema.  GASTROINTESTINAL: No nausea, vomiting, diarrhea or abdominal pain.  GENITOURINARY: No dysuria, hematuria.  ENDOCRINE: No polyuria, nocturia,  HEMATOLOGY: No anemia, easy bruising or bleeding SKIN: No rash or lesion. MUSCULOSKELETAL: No joint pain or arthritis.   NEUROLOGIC: No tingling, numbness, weakness.  PSYCHIATRY: No anxiety or depression.   MEDICATIONS AT HOME:  Prior to Admission medications   Medication Sig Start Date End Date Taking? Authorizing Provider  acetaminophen (TYLENOL) 500 MG tablet Take 500-1,000 mg by mouth every 12 (twelve) hours as needed for mild pain or moderate pain.    Yes [provider]  albuterol (PROVENTIL HFA;VENTOLIN HFA) 108 (90 Base) MCG/ACT inhaler Inhale 2 puffs into the lungs every 6 (six) hours as needed for wheezing or shortness of breath. 12/28/16  Yes Wieting,  Richard, MD  amLODipine (NORVASC) 10 MG tablet Take 1 tablet (10 mg total) by mouth daily. 12/28/16  Yes Wieting, Delfino Lovett, MD  aspirin EC 81 MG tablet Take 81 mg by mouth daily.   Yes [provider]  bismuth subsalicylate (PEPTO BISMOL) 262 MG/15ML suspension Take 30 mLs by mouth every 6 (six) hours as needed.   Yes [provider]  budesonide-formoterol (SYMBICORT) 80-4.5 MCG/ACT inhaler Inhale 2 puffs into the lungs 2 (two) times daily. 12/28/16  Yes Loletha Grayer, MD  carvedilol (COREG) 25 MG tablet Take 1 tablet (25 mg total) by mouth 2 (two) times daily with a meal. 12/28/16  Yes Wieting, Richard, MD  dextromethorphan-guaiFENesin (ROBITUSSIN-DM) 10-100 MG/5ML liquid Take 10 mLs by mouth every 6 (six) hours as needed for cough.   Yes [provider]  dorzolamide-timolol (COSOPT) 22.3-6.8 MG/ML ophthalmic solution Place 1 drop into the right eye 2 (two) times daily.   Yes [provider]  ergocalciferol (VITAMIN D2) 50000 units capsule Take 50,000 Units by mouth every 30 (thirty) days.   Yes [provider]  furosemide (LASIX) 80 MG tablet Take 1 tablet (80 mg total) by mouth daily. 12/28/16  Yes Wieting, Richard, MD  gabapentin (NEURONTIN) 400 MG capsule Take 400 mg by mouth daily.    Yes [provider]  insulin aspart (NOVOLOG) 100 UNIT/ML injection If sugars over 200- 300 can give 4 units with meal; if sugar over 301 can give 6 units with meal 12/28/16  Yes Wieting, Richard, MD  lisinopril (PRINIVIL,ZESTRIL) 40 MG tablet Take 40 mg by mouth daily.    Yes [provider]  multivitamin (RENA-VIT) TABS tablet Take 1 tablet by mouth daily.   Yes [provider]  nitroGLYCERIN (NITROSTAT) 0.3 MG SL tablet Place 0.3 mg under the tongue every 5 (five) minutes as needed for chest pain.   Yes [provider]  Nutritional Supplements (FEEDING SUPPLEMENT, NEPRO CARB STEADY,) LIQD Take 237 mLs by mouth 3 (three) times daily  between meals. 12/28/16  Yes Wieting, Richard, MD  ondansetron (ZOFRAN-ODT) 4 MG disintegrating tablet Take 4 mg by mouth every 8 (eight) hours as needed for nausea or vomiting.   Yes [provider]  polycarbophil (FIBERCON) 625 MG tablet Take 1,250 mg by mouth daily.   Yes [provider]  promethazine 25 mg in sodium chloride 0.9 % 1,000 mL Inject 12.5 mg into the vein once.   Yes [provider]  sertraline (ZOLOFT) 25 MG tablet Take 25 mg by mouth daily.   Yes [provider]  sevelamer (RENAGEL) 800 MG tablet Take 1,600 mg by mouth 3 (three) times daily with meals.   Yes [provider]  tiotropium (SPIRIVA HANDIHALER) 18 MCG inhalation capsule Place 1 capsule (18 mcg total) into inhaler and inhale  daily. 12/28/16 12/28/17 Yes Wieting, Richard, MD  Influenza vac split quadrivalent PF (FLUZONE HIGH-DOSE) 0.5 ML injection Inject 0.5 mLs into the muscle once.    [provider]      PHYSICAL EXAMINATION:   VITAL SIGNS: Blood pressure (!) 181/75, pulse 89, temperature 98.4 F (36.9 C), temperature source Rectal, resp. rate (!) 26, SpO2 93 %.  GENERAL:  82 y.o.-year-old patient lying in the bed with no acute distress.  EYES:  No scleral icterus. Extraocular muscles intact. Legally blind, white out cornea. HEENT: Head atraumatic, normocephalic. Oropharynx and nasopharynx clear.  NECK:  Supple, no jugular venous distention. No thyroid enlargement, no tenderness.  LUNGS: Decreased breath sounds bilaterally, no wheezing, rales,rhonchi or crepitation. No use of accessory muscles of respiration. On Supplemental oxygen. CARDIOVASCULAR: S1, S2 normal. No murmurs, rubs, or gallops.  ABDOMEN: Soft, nontender, nondistended. Bowel sounds present. No organomegaly or mass.  EXTREMITIES: No pedal edema, cyanosis, or clubbing. Right arm AV fistula in place. Left LL AKA present. NEUROLOGIC: Cranial nerves II through XII are intact. Muscle strength 5/5 in all  extremities ( Left LL AKA). Sensation intact. Gait not checked.  PSYCHIATRIC: The patient is alert and oriented x 3.  SKIN: No obvious rash, lesion, or ulcer.   LABORATORY PANEL:   CBC  Recent Labs Lab 01/18/17 1604  WBC 6.2  HGB 10.1*  HCT 32.1*  PLT 384  MCV 65.3*  MCH 20.6*  MCHC 31.5*  RDW 21.5*  LYMPHSABS 0.3*  MONOABS 0.7  EOSABS 0.1  BASOSABS 0.0   ------------------------------------------------------------------------------------------------------------------  Chemistries   Recent Labs Lab 01/18/17 1604  NA 139  K 3.4*  CL 95*  CO2 29  GLUCOSE 186*  BUN 27*  CREATININE 3.63*  CALCIUM 7.6*  AST 43*  ALT 22  ALKPHOS 121  BILITOT 1.1   ------------------------------------------------------------------------------------------------------------------ CrCl cannot be calculated (Unknown ideal weight.). ------------------------------------------------------------------------------------------------------------------ No results for input(s): TSH, T4TOTAL, T3FREE, THYROIDAB in the last 72 hours.  Invalid input(s): FREET3   Coagulation profile No results for input(s): INR, PROTIME in the last 168 hours. ------------------------------------------------------------------------------------------------------------------- No results for input(s): DDIMER in the last 72 hours. -------------------------------------------------------------------------------------------------------------------  Cardiac Enzymes  Recent Labs Lab 01/18/17 1604  TROPONINI 0.10*   ------------------------------------------------------------------------------------------------------------------ Invalid input(s): POCBNP  ---------------------------------------------------------------------------------------------------------------  Urinalysis    Component Value Date/Time   COLORURINE Yellow 11/18/2013 1643   APPEARANCEUR Clear 11/18/2013 1643   LABSPEC 1.010 11/18/2013 1643    PHURINE 7.0 11/18/2013 1643   GLUCOSEU 150 mg/dL 11/18/2013 1643   HGBUR 1+ 11/18/2013 1643   BILIRUBINUR Negative 11/18/2013 1643   KETONESUR Negative 11/18/2013 1643   PROTEINUR 100 mg/dL 11/18/2013 1643   NITRITE Negative 11/18/2013 1643   LEUKOCYTESUR Negative 11/18/2013 1643     RADIOLOGY: Dg Chest Port 1 View  Result Date: 01/18/2017 CLINICAL DATA:  Weakness and decreased oxygen saturation at dialysis today. EXAM: PORTABLE CHEST 1 VIEW COMPARISON:  Single-view of the chest 12/25/2016. CT chest 12/26/2016. FINDINGS: Moderately large left and small right pleural effusion are again seen. Pulmonary edema is also again seen and not notably changed. Cardiomegaly noted. Aortic atherosclerosis is seen. IMPRESSION: No change in pulmonary edema and left much greater than right pleural effusions. Electronically Signed   By: Inge Rise M.D.   On: 01/18/2017 16:12    EKG: Orders placed or performed during the hospital encounter of 01/18/17  . ED EKG  . ED EKG  . EKG 12-Lead  . EKG 12-Lead    IMPRESSION AND PLAN:  * Ac on Ch  respi failure   Due to pleural effusion     Need pleural tap   IR consult.   Did not respond to HD>  * ESRD on HD   COnt HD, nephro to help.  * Htn   Cont home meds  * COPD   No exacerbation  * Ch diastolic CHF   No exacerbation.   Monitor.  All the records are reviewed and case discussed with ED provider. Management plans discussed with the patient, family and they are in agreement.  CODE STATUS: partial. Code Status History    Date Active Date Inactive Code Status Order ID Comments User Context   12/25/2016  6:52 PM 12/28/2016  7:56 PM Partial Code [id number]  Latanya Maudlin, RN Inpatient   12/25/2016  6:48 PM 12/25/2016  6:52 PM Partial Code [id number]  Latanya Maudlin, RN Inpatient   12/22/2016  8:18 PM 12/25/2016  6:48 PM Full Code [id number]  Baxter Hire, MD Inpatient   03/09/2016 10:35 PM 03/11/2016 11:21 PM Full Code [id number]   Hugelmeyer, Ubaldo Glassing, DO Inpatient   11/10/2014  7:54 PM 11/15/2014  1:12 AM Full Code [id number]  Demetrios Loll, MD ED   10/28/2014  9:19 PM 10/31/2014  3:04 PM Full Code [id number]  Nicholes Mango, MD Inpatient    Questions for Most Recent Historical Code Status (Order [id number])    Question Answer Comment   In the event of cardiac or respiratory ARREST: Initiate Code Blue, Call Rapid Response Yes    In the event of cardiac or respiratory ARREST: Perform CPR Yes    In the event of cardiac or respiratory ARREST: Perform Intubation/Mechanical Ventilation No    In the event of cardiac or respiratory ARREST: Use NIPPV/BiPAp only if indicated Yes    In the event of cardiac or respiratory ARREST: Administer ACLS medications if indicated Yes    In the event of cardiac or respiratory ARREST: Perform Defibrillation or Cardioversion if indicated Yes        TOTAL TIME TAKING CARE OF THIS PATIENT: 45 minutes.    Vaughan Basta M.D on 01/18/2017   Between 7am to 6pm - Pager - [phone]  After 6pm go to [email or web address] - password [email or web address] [facility]  Office  [phone]  CC: Primary care physician; Rinaldo Cloud, MD   Note: This dictation was prepared with Dragon dictation along with smaller phrase technology. Any transcriptional errors that result from this process are unintentional.

## 2017-01-18 NOTE — ED Notes (Signed)
Family at bedside. 

## 2017-01-18 NOTE — ED Notes (Addendum)
Pt 90% on 6L [name], MD Vachhani aware . Pt in NAD at this time

## 2017-01-18 NOTE — Progress Notes (Signed)
Rapid response called due to patient hypoxia and increased work of breathing.  He has large left and moderate right pleural effusions.  He is a dialysis patient.  Sats improved on HFNC, but he still desats frequently.  Patient is DNI, BiPAP ordered and will transfer to stepdown unit.  Jacqulyn Bath [facility] 01/18/2017, 10:02 PM

## 2017-01-18 NOTE — Progress Notes (Signed)
Patient came in with shortness of breath due to pleural effusions.   Oxygen levels kept decreasing while on 6L nasal cannula.  Patient put on non rebreather 15L.  Patient was in pain so medication was given as well as anxiety medicine.  Patient was able to relax until coughing spell occurred.  Patient began to desat so respiratory was called and patient was placed on HFNC.  Patient still struggled to breathe, so rapid response was called, Dr. Jannifer [name] evaluated patient, and he was then transferred to stepdown unit to be placed on bipap.  [name] 01/18/2017  10:30 PM

## 2017-01-18 NOTE — ED Notes (Signed)
Pt noted to be sleeping, tolerating nasal cannula at this time.

## 2017-01-18 NOTE — Progress Notes (Signed)
eLink Physician-Brief Progress Note Patient Name: [name] DOB: 1935/03/24 MRN: [id number]   Date of Service  01/18/2017  HPI/Events of Note  ESRD , resp distress CXR >> pulm edema + large left effusion  eICU Interventions  D/w renal re: early HD [place] in am HFNC for now - WOB ok     Intervention Category Evaluation Type: New Patient Evaluation  [name] V. 01/18/2017, 11:07 PM

## 2017-01-18 NOTE — ED Notes (Signed)
Pt placed on 5L [place] per MD Vachhani, pt 93%

## 2017-01-18 NOTE — Progress Notes (Signed)
   01/18/17 2100  Clinical Encounter Type  Visited With Family  Visit Type (Rapid response)  Referral From Nurse  [facility] responded to rapid response page; [place] spoke briefly with family; No additional support required at this time.

## 2017-01-19 ENCOUNTER — Inpatient Hospital Stay: Payer: Medicare (Managed Care)

## 2017-01-19 LAB — BASIC METABOLIC PANEL
Anion gap: 17 — ABNORMAL HIGH (ref 5–15)
BUN: 43 mg/dL — AB (ref 6–20)
CHLORIDE: 95 mmol/L — AB (ref 101–111)
CO2: 27 mmol/L (ref 22–32)
CREATININE: 5.02 mg/dL — AB (ref 0.61–1.24)
Calcium: 6.6 mg/dL — ABNORMAL LOW (ref 8.9–10.3)
GFR calc Af Amer: 11 mL/min — ABNORMAL LOW (ref >60)
GFR calc non Af Amer: 10 mL/min — ABNORMAL LOW (ref >60)
GLUCOSE: 364 mg/dL — AB (ref 65–99)
POTASSIUM: 3.4 mmol/L — AB (ref 3.5–5.1)
Sodium: 139 mmol/L (ref 135–145)

## 2017-01-19 LAB — GLUCOSE, CAPILLARY
GLUCOSE-CAPILLARY: 331 mg/dL — AB (ref 65–99)
GLUCOSE-CAPILLARY: 113 mg/dL — AB (ref 65–99)
GLUCOSE-CAPILLARY: 226 mg/dL — AB (ref 65–99)

## 2017-01-19 LAB — BODY FLUID CELL COUNT WITH DIFFERENTIAL
Eos, Fluid: 0 %
Lymphs, Fluid: 76 %
MONOCYTE-MACROPHAGE-SEROUS FLUID: 8 %
Neutrophil Count, Fluid: 16 %
Total Nucleated Cell Count, Fluid: 68 cu mm

## 2017-01-19 LAB — CBC
HEMATOCRIT: 31.6 % — AB (ref 40.0–52.0)
Hemoglobin: 9.7 g/dL — ABNORMAL LOW (ref 13.0–18.0)
MCH: 20 pg — ABNORMAL LOW (ref 26.0–34.0)
MCHC: 30.6 g/dL — AB (ref 32.0–36.0)
MCV: 65.4 fL — AB (ref 80.0–100.0)
PLATELETS: 298 10*3/uL (ref 150–440)
RBC: 4.84 MIL/uL (ref 4.40–5.90)
RDW: 20.9 % — AB (ref 11.5–14.5)
WBC: 5 10*3/uL (ref 3.8–10.6)

## 2017-01-19 LAB — PROTEIN, PLEURAL OR PERITONEAL FLUID

## 2017-01-19 LAB — LACTATE DEHYDROGENASE: LDH: 272 U/L — AB (ref 98–192)

## 2017-01-19 LAB — GLUCOSE, PLEURAL OR PERITONEAL FLUID: Glucose, Fluid: 375 mg/dL

## 2017-01-19 LAB — PATHOLOGIST SMEAR REVIEW

## 2017-01-19 MED ORDER — MORPHINE SULFATE (PF) 2 MG/ML IV SOLN
2.0000 mg | Freq: Once | INTRAVENOUS | Status: AC
  Administered 2017-01-19: 2 mg via INTRAVENOUS

## 2017-01-19 MED ORDER — INSULIN ASPART 100 UNIT/ML ~~LOC~~ SOLN
0.0000 [IU] | Freq: Every day | SUBCUTANEOUS | Status: DC
  Administered 2017-01-19: 2 [IU] via SUBCUTANEOUS
  Filled 2017-01-19: qty 1

## 2017-01-19 MED ORDER — INSULIN ASPART 100 UNIT/ML ~~LOC~~ SOLN
0.0000 [IU] | Freq: Three times a day (TID) | SUBCUTANEOUS | Status: DC
  Administered 2017-01-19: 11 [IU] via SUBCUTANEOUS
  Administered 2017-01-20: 8 [IU] via SUBCUTANEOUS
  Administered 2017-01-21: 11 [IU] via SUBCUTANEOUS
  Filled 2017-01-19 (×3): qty 1

## 2017-01-19 MED ORDER — IPRATROPIUM-ALBUTEROL 0.5-2.5 (3) MG/3ML IN SOLN
3.0000 mL | Freq: Four times a day (QID) | RESPIRATORY_TRACT | Status: DC | PRN
  Administered 2017-01-21: 3 mL via RESPIRATORY_TRACT
  Filled 2017-01-19: qty 3

## 2017-01-19 MED ORDER — MORPHINE SULFATE (PF) 2 MG/ML IV SOLN
2.0000 mg | INTRAVENOUS | Status: DC | PRN
  Filled 2017-01-19: qty 1

## 2017-01-19 MED ORDER — POTASSIUM CHLORIDE 20 MEQ PO PACK
60.0000 meq | PACK | Freq: Once | ORAL | Status: AC
  Administered 2017-01-19: 60 meq via ORAL
  Filled 2017-01-19: qty 3

## 2017-01-19 NOTE — Progress Notes (Signed)
[place] at [place] NAME: [name]    MR#:  [id number]  DATE OF BIRTH:  03-12-1935  SUBJECTIVE:  CHIEF COMPLAINT:  Patient is legally blind feeling better after thoracentesis. Daughter at bedside  REVIEW OF SYSTEMS:  CONSTITUTIONAL: No fever, fatigue or weakness.  EYES: No blurred or double vision.  EARS, NOSE, AND THROAT: No tinnitus or ear pain.  RESPIRATORY: improved cough, shortness of breath, denies wheezing or hemoptysis.  CARDIOVASCULAR: No chest pain, orthopnea, edema.  GASTROINTESTINAL: No nausea, vomiting, diarrhea or abdominal pain.  GENITOURINARY: No dysuria, hematuria.  ENDOCRINE: No polyuria, nocturia,  HEMATOLOGY: No anemia, easy bruising or bleeding SKIN: No rash or lesion. MUSCULOSKELETAL: No joint pain or arthritis.   NEUROLOGIC: No tingling, numbness, weakness.  PSYCHIATRY: No anxiety or depression.   DRUG ALLERGIES:  No Known Allergies  VITALS:  Blood pressure (!) 96/53, pulse 78, temperature 98.5 F (36.9 C), temperature source Oral, resp. rate (!) 23, SpO2 91 %.  PHYSICAL EXAMINATION:  GENERAL:  82 y.o.-year-old patient lying in the bed with no acute distress.  EYES: Pupils equal, round, reactive to light and accommodation. No scleral icterus. Extraocular muscles intact.  HEENT: Head atraumatic, normocephalic. Oropharynx and nasopharynx clear.  NECK:  Supple, no jugular venous distention. No thyroid enlargement, no tenderness.  LUNGS: Normal breath sounds bilaterally, no wheezing, rales,rhonchi or crepitation. No use of accessory muscles of respiration.  CARDIOVASCULAR: S1, S2 normal. No murmurs, rubs, or gallops.  ABDOMEN: Soft, nontender, nondistended. Bowel sounds present. No organomegaly or mass.  EXTREMITIES: left lower extremity AKA No pedal edema, cyanosis, or clubbing.  NEUROLOGIC: Cranial nerves II through XII are intact.  Sensation intact. Gait not checked.  PSYCHIATRIC: The patient is alert  and oriented x 3.  SKIN: No obvious rash, lesion, or ulcer.    LABORATORY PANEL:   CBC  Recent Labs Lab 01/19/17 0320  WBC 5.0  HGB 9.7*  HCT 31.6*  PLT 298   ------------------------------------------------------------------------------------------------------------------  Chemistries   Recent Labs Lab 01/18/17 1604 01/19/17 0320  NA 139 139  K 3.4* 3.4*  CL 95* 95*  CO2 29 27  GLUCOSE 186* 364*  BUN 27* 43*  CREATININE 3.63* 5.02*  CALCIUM 7.6* 6.6*  AST 43*  --   ALT 22  --   ALKPHOS 121  --   BILITOT 1.1  --    ------------------------------------------------------------------------------------------------------------------  Cardiac Enzymes  Recent Labs Lab 01/18/17 1604  TROPONINI 0.10*   ------------------------------------------------------------------------------------------------------------------  RADIOLOGY:  Dg Chest Port 1 View  Result Date: 01/19/2017 CLINICAL DATA:  Status post left thoracentesis. EXAM: PORTABLE CHEST 1 VIEW COMPARISON:  Radiograph of January 18, 2017. FINDINGS: Stable cardiomediastinal silhouette. Atherosclerosis of thoracic aorta is noted. No pneumothorax is noted. Left pleural effusion is nearly resolved status post thoracentesis. Mild bibasilar edema is noted. Minimal right pleural effusion is noted. Bony thorax is unremarkable. IMPRESSION: Aortic atherosclerosis. Left pleural effusion is nearly resolved status post thoracentesis. Mild bibasilar edema is noted. Electronically Signed   By: Marijo Conception, M.D.   On: 01/19/2017 12:10   Dg Chest Port 1 View  Result Date: 01/18/2017 CLINICAL DATA:  Weakness and decreased oxygen saturation at dialysis today. EXAM: PORTABLE CHEST 1 VIEW COMPARISON:  Single-view of the chest 12/25/2016. CT chest 12/26/2016. FINDINGS: Moderately large left and small right pleural effusion are again seen. Pulmonary edema is also again seen and not notably changed. Cardiomegaly noted. Aortic  atherosclerosis is seen. IMPRESSION: No change in pulmonary  edema and left much greater than right pleural effusions. Electronically Signed   By: Inge Rise M.D.   On: 01/18/2017 16:12   US Thoracentesis Asp Pleural Space W/img Guide  Result Date: 01/19/2017 INDICATION: End-stage renal disease, CHF, bilateral pleural effusions left greater than right, dyspnea; request received for diagnostic and therapeutic left thoracentesis. EXAM: ULTRASOUND GUIDED DIAGNOSTIC AND THERAPEUTIC LEFT THORACENTESIS MEDICATIONS: None. COMPLICATIONS: None immediate. PROCEDURE: An ultrasound guided thoracentesis was thoroughly discussed with the patient and questions answered. The benefits, risks, alternatives and complications were also discussed. The patient understands and wishes to proceed with the procedure. Written consent was obtained. Ultrasound was performed to localize and mark an adequate pocket of fluid in the left chest. The area was then prepped and draped in the normal sterile fashion. 1% Lidocaine was used for local anesthesia. Under ultrasound guidance a Yueh catheter was introduced. Thoracentesis was performed. The catheter was removed and a dressing applied. FINDINGS: A total of approximately 2.2 liters of hazy, amber/blood-tinged fluid was removed. Samples were sent to the laboratory as requested by the clinical team. IMPRESSION: Successful ultrasound guided diagnostic and therapeutic left thoracentesis yielding 2.2 of pleural fluid. Read by: Rowe Robert, PA-C Electronically Signed   By: Inez Catalina M.D.   On: 01/19/2017 11:15    EKG:   Orders placed or performed during the hospital encounter of 01/18/17  . ED EKG  . ED EKG  . EKG 12-Lead  . EKG 12-Lead    ASSESSMENT AND PLAN:     * Acute  on Ch respi failure   Due to large left and moderate right pleural effusion Transferred to intensive care unit overnight and patient was placed on BiPAP Had a thoracentesis done  today patient had 2.2  L of fluid extraction On high flow oxygen during my examination Pulmonology is following if patient is clinically improving and transferred to the floor     * ESRD on HD   COnt HD, nephro to follow-up  * Htn   Cont home meds Coreg, Norvasc and hydralazine IV as needed  * COPD   No exacerbation,nebulizer treatments as needed  * Ch diastolic CHF   No exacerbation.   Monitor daily weights Continue Lasix 80 mg, Coreg and aspirin.     All the records are reviewed and case discussed with Care Management/Social Workerr. Management plans discussed with the patient, daughter at bedside and they are in agreement.  CODE STATUS: partial   TOTAL TIME TAKING CARE OF THIS PATIENT: 32 minutes.   POSSIBLE D/C IN 2  DAYS, DEPENDING ON CLINICAL CONDITION.  Note: This dictation was prepared with Dragon dictation along with smaller phrase technology. Any transcriptional errors that result from this process are unintentional.   Nicholes Mango M.D on 01/19/2017 at 3:05 PM  Between 7am to 6pm - Pager - [phone] After 6pm go to [email or web address] - password [email or web address] [facility]  [facility]  Office  [phone]  CC: Primary care physician; Rinaldo Cloud, MD

## 2017-01-19 NOTE — Procedures (Signed)
Ultrasound-guided diagnostic and  therapeutic left thoracentesis performed yielding 2.2 liters of hazy, amber/blood-tinged fluid. No immediate complications. Follow-up chest x-ray pending. A portion of the fluid was sent to the lab for preordered studies.

## 2017-01-19 NOTE — Progress Notes (Signed)
Inpatient Diabetes Program Recommendations  AACE/ADA: New Consensus Statement on Inpatient Glycemic Control (2015)  Target Ranges:  Prepandial:   less than 140 mg/dL      Peak postprandial:   less than 180 mg/dL (1-2 hours)      Critically ill patients:  140 - 180 mg/dL  Results for [facility] (MRN [id number]) as of 01/19/2017 08:53  Ref. Range 01/18/2017 16:04 01/19/2017 03:20  Glucose Latest Ref Range: 65 - 99 mg/dL 186 (H) 364 (H)   Results for KEVIN, SPACE (MRN [id number]) as of 01/19/2017 08:53  Ref. Range 01/18/2017 22:22  Glucose-Capillary Latest Ref Range: 65 - 99 mg/dL 295 (H)   Review of Glycemic Control  Diabetes history: DM2 Outpatient Diabetes medications:Novolog 4 units with meals if CBG 200-300 mg/dl or 6 units if > 301 mg/dl Current orders for Inpatient glycemic control: None  Inpatient Diabetes Program Recommendations: Correction (SSI): Please consider ordering Novolog 0-9 units Q4H.  NOTE: Patient has a history of DM2 and initial lab glucose was 186 mg/dl on 01/18/17. Patient received Solumedrol 125 mg x 1 on 01/18/17 @ 22:25 which is contributing to hyperglycemia.   Thanks, Barnie Alderman, RN, MSN, CDE Diabetes Coordinator Inpatient Diabetes Program [phone] (Team Pager from 8am to 5pm)

## 2017-01-19 NOTE — Progress Notes (Signed)
Pt is A&OX4. VSS. Oriented to unit and skin assessment complete. Course crackles auscultated in LLL. Pt denies pain and is in no acute distress at this time. States he feels "much better" since his thoracentesis. Will continue to monitor.

## 2017-01-19 NOTE — Progress Notes (Signed)
Called report to [facility] on 1A. Patient stable and will be transferred to Room 152. Notified [place] (HCPOA/Daughter) of transfer.

## 2017-01-20 DIAGNOSIS — J9601 Acute respiratory failure with hypoxia: Secondary | ICD-10-CM

## 2017-01-20 LAB — CBC
HCT: 32.4 % — ABNORMAL LOW (ref 40.0–52.0)
HEMOGLOBIN: 9.9 g/dL — AB (ref 13.0–18.0)
MCH: 20.3 pg — AB (ref 26.0–34.0)
MCHC: 30.4 g/dL — AB (ref 32.0–36.0)
MCV: 67 fL — AB (ref 80.0–100.0)
Platelets: 327 10*3/uL (ref 150–440)
RBC: 4.84 MIL/uL (ref 4.40–5.90)
RDW: 21.7 % — ABNORMAL HIGH (ref 11.5–14.5)
WBC: 7.3 10*3/uL (ref 3.8–10.6)

## 2017-01-20 LAB — BASIC METABOLIC PANEL
Anion gap: 19 — ABNORMAL HIGH (ref 5–15)
BUN: 74 mg/dL — ABNORMAL HIGH (ref 6–20)
CHLORIDE: 96 mmol/L — AB (ref 101–111)
CO2: 25 mmol/L (ref 22–32)
CREATININE: 6.83 mg/dL — AB (ref 0.61–1.24)
Calcium: 6.6 mg/dL — ABNORMAL LOW (ref 8.9–10.3)
GFR calc Af Amer: 8 mL/min — ABNORMAL LOW (ref >60)
GFR calc non Af Amer: 7 mL/min — ABNORMAL LOW (ref >60)
GLUCOSE: 304 mg/dL — AB (ref 65–99)
POTASSIUM: 5.1 mmol/L (ref 3.5–5.1)
SODIUM: 140 mmol/L (ref 135–145)

## 2017-01-20 LAB — GLUCOSE, CAPILLARY
GLUCOSE-CAPILLARY: 279 mg/dL — AB (ref 65–99)
GLUCOSE-CAPILLARY: 171 mg/dL — AB (ref 65–99)
Glucose-Capillary: 117 mg/dL — ABNORMAL HIGH (ref 65–99)
Glucose-Capillary: 86 mg/dL (ref 65–99)

## 2017-01-20 LAB — MISC LABCORP TEST (SEND OUT): LABCORP TEST CODE: 19588

## 2017-01-20 NOTE — Progress Notes (Signed)
[facility], [place] 01/20/17  Subjective:   Patient is known to our practice from previous admissions.  He presented to the emergency room via EMS from the dialysis center for weakness and low oxygen saturation. He underwent Ultrasound-guided therapeutic left thoracentesis.  2.2 L of fluid was removed.  Patient felt better after the procedure.   This morning he feels well.  No acute complaints.  No shortness of breath. Nephrology consult has been requested for continuation of dialysis  Objective:  Vital signs in last 24 hours:  Temp:  [98.3 F (36.8 C)-98.4 F (36.9 C)] 98.4 F (36.9 C) (10/24 0431) Pulse Rate:  [34-84] 77 (10/24 0431) Resp:  [12-23] 20 (10/24 0431) BP: (92-153)/(46-72) 129/62 (10/24 0431) SpO2:  [81 %-100 %] 90 % (10/24 0431)  Weight change:  There were no vitals filed for this visit.  Intake/Output:   No intake or output data in the 24 hours ending 01/20/17 0948   Physical Exam: General:  Laying in the bed no acute distress  HEENT  bilateral corneal opacities  Neck  no distended neck veins  Pulm/lungs  mild rhonchi, decreased breath sounds at bases  CVS/Heart  no rub  Abdomen:   Soft, nontender  Extremities:  Left AKA  Neurologic:  Alert, able to answer questions  Skin:  No acute rashes, normal turgor  Access:  Right arm AV fistula       Basic Metabolic Panel:   Recent Labs Lab 01/18/17 1604 01/19/17 0320 01/20/17 0558  NA 139 139 140  K 3.4* 3.4* 5.1  CL 95* 95* 96*  CO2 29 27 25   GLUCOSE 186* 364* 304*  BUN 27* 43* 74*  CREATININE 3.63* 5.02* 6.83*  CALCIUM 7.6* 6.6* 6.6*     CBC:  Recent Labs Lab 01/18/17 1604 01/19/17 0320 01/20/17 0558  WBC 6.2 5.0 7.3  NEUTROABS 5.2  --   --   HGB 10.1* 9.7* 9.9*  HCT 32.1* 31.6* 32.4*  MCV 65.3* 65.4* 67.0*  PLT 384 298 327     Lab Results  Component Value Date   HEPBSAG Negative 12/25/2016      Microbiology:  Recent Results (from the past 240  hour(s))  MRSA PCR Screening     Status: None   Collection Time: 01/18/17  8:05 PM  Result Value Ref Range Status   MRSA by PCR NEGATIVE NEGATIVE Final    Comment:        The GeneXpert MRSA Assay (FDA approved for NASAL specimens only), is one component of a comprehensive MRSA colonization surveillance program. It is not intended to diagnose MRSA infection nor to guide or monitor treatment for MRSA infections.   Body fluid culture     Status: None (Preliminary result)   Collection Time: 01/19/17 10:45 AM  Result Value Ref Range Status   Specimen Description PLEURAL  Final   Special Requests NONE  Final   Gram Stain   Final    RARE WBC PRESENT, PREDOMINANTLY MONONUCLEAR NO ORGANISMS SEEN Performed at [facility], [address], [place], [place] [address]    Culture PENDING  Incomplete   Report Status PENDING  Incomplete    Coagulation Studies: No results for input(s): LABPROT, INR in the last 72 hours.  Urinalysis: No results for input(s): COLORURINE, LABSPEC, PHURINE, GLUCOSEU, HGBUR, BILIRUBINUR, KETONESUR, PROTEINUR, UROBILINOGEN, NITRITE, LEUKOCYTESUR in the last 72 hours.  Invalid input(s): APPERANCEUR    Imaging: Dg Chest Port 1 View  Result Date: 01/19/2017 CLINICAL DATA:  Status post left thoracentesis. EXAM: PORTABLE CHEST 1 VIEW COMPARISON:  Radiograph of January 18, 2017. FINDINGS: Stable cardiomediastinal silhouette. Atherosclerosis of thoracic aorta is noted. No pneumothorax is noted. Left pleural effusion is nearly resolved status post thoracentesis. Mild bibasilar edema is noted. Minimal right pleural effusion is noted. Bony thorax is unremarkable. IMPRESSION: Aortic atherosclerosis. Left pleural effusion is nearly resolved status post thoracentesis. Mild bibasilar edema is noted. Electronically Signed   By: Marijo Conception, M.D.   On: 01/19/2017 12:10   Dg Chest Port 1 View  Result Date: 01/18/2017 CLINICAL DATA:  Weakness and decreased oxygen  saturation at dialysis today. EXAM: PORTABLE CHEST 1 VIEW COMPARISON:  Single-view of the chest 12/25/2016. CT chest 12/26/2016. FINDINGS: Moderately large left and small right pleural effusion are again seen. Pulmonary edema is also again seen and not notably changed. Cardiomegaly noted. Aortic atherosclerosis is seen. IMPRESSION: No change in pulmonary edema and left much greater than right pleural effusions. Electronically Signed   By: Inge Rise M.D.   On: 01/18/2017 16:12   US Thoracentesis Asp Pleural Space W/img Guide  Result Date: 01/19/2017 INDICATION: End-stage renal disease, CHF, bilateral pleural effusions left greater than right, dyspnea; request received for diagnostic and therapeutic left thoracentesis. EXAM: ULTRASOUND GUIDED DIAGNOSTIC AND THERAPEUTIC LEFT THORACENTESIS MEDICATIONS: None. COMPLICATIONS: None immediate. PROCEDURE: An ultrasound guided thoracentesis was thoroughly discussed with the patient and questions answered. The benefits, risks, alternatives and complications were also discussed. The patient understands and wishes to proceed with the procedure. Written consent was obtained. Ultrasound was performed to localize and mark an adequate pocket of fluid in the left chest. The area was then prepped and draped in the normal sterile fashion. 1% Lidocaine was used for local anesthesia. Under ultrasound guidance a Yueh catheter was introduced. Thoracentesis was performed. The catheter was removed and a dressing applied. FINDINGS: A total of approximately 2.2 liters of hazy, amber/blood-tinged fluid was removed. Samples were sent to the laboratory as requested by the clinical team. IMPRESSION: Successful ultrasound guided diagnostic and therapeutic left thoracentesis yielding 2.2 of pleural fluid. Read by: Rowe Robert, PA-C Electronically Signed   By: Inez Catalina M.D.   On: 01/19/2017 11:15     Medications:    . amLODipine  10 mg Oral Daily  . aspirin EC  81 mg Oral  Daily  . carvedilol  25 mg Oral BID WC  . feeding supplement (NEPRO CARB STEADY)  237 mL Oral TID BM  . furosemide  80 mg Oral Daily  . gabapentin  400 mg Oral Daily  . heparin  5,000 Units Subcutaneous Q8H  . insulin aspart  0-15 Units Subcutaneous TID WC  . insulin aspart  0-5 Units Subcutaneous QHS  . lisinopril  40 mg Oral Daily  . mometasone-formoterol  2 puff Inhalation BID  . multivitamin  1 tablet Oral Daily  . sertraline  25 mg Oral Daily  . sevelamer carbonate  800 mg Oral TID WC  . tiotropium  18 mcg Inhalation Daily   docusate sodium, guaiFENesin-codeine, hydrALAZINE, HYDROcodone-acetaminophen, hydrOXYzine, ipratropium-albuterol, morphine injection  Assessment/ Plan:  82 y.o. male with end stage renal disease on hemodialysis, hypertension, peripheral vascular disease, left AKA, MGUS, glaucoma, diabetes mellitus type II, congestive heart failure, blindness  MWF [facility] [place].   1. End Stage Renal Disease: on hemodialysis. MWF.  We will continue his routine dialysis Orders prepared  2.  Anemia of chronic kidney disease Hemoglobin 9.9 Able continue low-dose Procrit  3.  Shortness of breath Feels better after thoracentesis.  2.2 L removed  4.  Secondary hyperparathyroidism Monitor phosphorus Continue sevelamer   LOS: 2 [facility] 10/24/20189:48 AM  [facility] [place], [place]

## 2017-01-20 NOTE — Progress Notes (Signed)
Post HD  

## 2017-01-20 NOTE — Progress Notes (Signed)
Pre HD  

## 2017-01-20 NOTE — Progress Notes (Addendum)
* [facility]    ASSESSMENT / PLAN: Acute hypoxic respiratoy failure secondary to pulmonary edema with large left pleural effusion status post thoracentesis. Coughing spells-?bronchospasm?-Now improved. ESRD on HD H/O Hypertension CODE STATUS is limited code; no intubation but other interventions including CPR and ACLS meds are okay.  -Supplemental O2 wean down as tolerated -Continue outpatient Symbicort and Spiriva, presumably these were started for a diagnosis of chronic bronchitis and/or COPD. -Patient has been admitted twice this month for acute volume overload/CHF, and remains at high risk for recurrence, decompensation.  Palliative care consultation may be helpful. -Thus far pleural fluid results show bloody tap with low protein, this appears to be consistent with a transudate, however further tests are pending.   Date: 01/20/2017  MRN# [id number] [name] 1935/03/19   [name] is a 82 y.o. old male seen in follow up for chief complaint of  Chief Complaint  Patient presents with  . Weakness     HPI:  Patient is awake today, he has no particular complaints.  He feels that his breathing is doing better than it was before.  Echocardiogram 12/27/16; EF equals 55%; normal RV systolic function  Images personally reviewed; chest x-ray 10/23, 01/18/17 initial chest x-ray shows a moderate left-sided pleural effusion, postthoracentesis chest x-ray was reviewed that shows resolution of the effusion, with continued bilateral pulmonary edema.  Pleural fluid Culture negative thus far Protein less than 3, suggestive of transudate   Medication:    Current Facility-Administered Medications:  .  amLODipine (NORVASC) tablet 10 mg, 10 mg, Oral, Daily, Vaughan Basta, MD, Stopped at 01/20/17 0820 .  aspirin EC tablet 81 mg, 81 mg, Oral, Daily, Vaughan Basta, MD, 81 mg at 01/20/17 0811 .  carvedilol (COREG) tablet 25 mg, 25 mg, Oral, BID WC,  Vaughan Basta, MD, 25 mg at 01/20/17 0811 .  docusate sodium (COLACE) capsule 100 mg, 100 mg, Oral, BID PRN, Vaughan Basta, MD .  feeding supplement (NEPRO CARB STEADY) liquid 237 mL, 237 mL, Oral, TID BM, Vaughan Basta, MD, 237 mL at 01/19/17 2048 .  furosemide (LASIX) tablet 80 mg, 80 mg, Oral, Daily, Vaughan Basta, MD, 80 mg at 01/19/17 1143 .  gabapentin (NEURONTIN) capsule 400 mg, 400 mg, Oral, Daily, Vaughan Basta, MD, 400 mg at 01/20/17 0811 .  guaiFENesin-codeine 100-10 MG/5ML solution 10 mL, 10 mL, Oral, Q4H PRN, Tukov, Magadalene S, NP, 10 mL at 01/18/17 2306 .  heparin injection 5,000 Units, 5,000 Units, Subcutaneous, Q8H, Vaughan Basta, MD, 5,000 Units at 01/20/17 3614 .  hydrALAZINE (APRESOLINE) injection 10-20 mg, 10-20 mg, Intravenous, Q4H PRN, Tukov, Magadalene S, NP, 10 mg at 01/19/17 0000 .  HYDROcodone-acetaminophen (NORCO/VICODIN) 5-325 MG per tablet 1 tablet, 1 tablet, Oral, Q4H PRN, Lance Coon, MD .  hydrOXYzine (ATARAX/VISTARIL) tablet 50 mg, 50 mg, Oral, TID PRN, Lance Coon, MD, 50 mg at 01/18/17 2123 .  insulin aspart (novoLOG) injection 0-15 Units, 0-15 Units, Subcutaneous, TID WC, Wilhelmina Mcardle, MD, 8 Units at 01/20/17 0810 .  insulin aspart (novoLOG) injection 0-5 Units, 0-5 Units, Subcutaneous, QHS, Wilhelmina Mcardle, MD, 2 Units at 01/19/17 2143 .  ipratropium-albuterol (DUONEB) 0.5-2.5 (3) MG/3ML nebulizer solution 3 mL, 3 mL, Nebulization, Q6H PRN, Tukov, Magadalene S, NP .  lisinopril (PRINIVIL,ZESTRIL) tablet 40 mg, 40 mg, Oral, Daily, Vaughan Basta, MD, Stopped at 01/20/17 0820 .  mometasone-formoterol (DULERA) 100-5 MCG/ACT inhaler 2 puff, 2 puff, Inhalation, BID, Vaughan Basta, MD, 2 puff at 01/19/17 2049 .  morphine 2 MG/ML  injection 2 mg, 2 mg, Intravenous, Q3H PRN, Patria Mane, Magadalene S, NP .  multivitamin (RENA-VIT) tablet 1 tablet, 1 tablet, Oral, Daily, Vaughan Basta, MD,  1 tablet at 01/20/17 0811 .  sertraline (ZOLOFT) tablet 25 mg, 25 mg, Oral, Daily, Vaughan Basta, MD, 25 mg at 01/20/17 0811 .  sevelamer carbonate (RENVELA) tablet 800 mg, 800 mg, Oral, TID WC, Vaughan Basta, MD, 800 mg at 01/20/17 0811 .  tiotropium (SPIRIVA) inhalation capsule 18 mcg, 18 mcg, Inhalation, Daily, Vaughan Basta, MD, 18 mcg at 01/19/17 1145   Allergies:  Patient has no known allergies.  Review of Systems: Gen:  Denies  fever, sweats. HEENT: Denies blurred vision. Cvc:  No dizziness, chest pain or heaviness Resp:   Denies cough or sputum porduction. Gi: Denies swallowing difficulty, stomach pain. constipation, bowel incontinence Gu:  Denies bladder incontinence, burning urine Ext:   No Joint pain, stiffness. Skin: No skin rash, easy bruising. Endoc:  No polyuria, polydipsia. Psych: No depression, insomnia. Other:  All other systems were reviewed and found to be negative other than what is mentioned in the HPI.   Physical Examination:   VS: BP 129/62 (BP Location: Left Arm)   Pulse 77   Temp 98.4 F (36.9 C) (Oral)   Resp 20   SpO2 90%   General Appearance: No distress  Neuro:without focal findings,  speech normal,  HEENT: PERRLA, EOM intact. Pulmonary: normal breath sounds, No wheezing.   CardiovascularNormal S1,S2.  No m/r/g.   Abdomen: Benign, Soft, non-tender. Renal:  No costovertebral tenderness  GU:  Not performed at this time. Endoc: No evident thyromegaly, no signs of acromegaly. Skin:   warm, no rash. Extremities: normal, no cyanosis, clubbing.   LABORATORY PANEL:   CBC  Recent Labs Lab 01/20/17 0558  WBC 7.3  HGB 9.9*  HCT 32.4*  PLT 327   ------------------------------------------------------------------------------------------------------------------  Chemistries   Recent Labs Lab 01/18/17 1604  01/20/17 0558  NA 139  < > 140  K 3.4*  < > 5.1  CL 95*  < > 96*  CO2 29  < > 25  GLUCOSE 186*  < > 304*   BUN 27*  < > 74*  CREATININE 3.63*  < > 6.83*  CALCIUM 7.6*  < > 6.6*  AST 43*  --   --   ALT 22  --   --   ALKPHOS 121  --   --   BILITOT 1.1  --   --   < > = values in this interval not displayed. ------------------------------------------------------------------------------------------------------------------  Cardiac Enzymes  Recent Labs Lab 01/18/17 1604  TROPONINI 0.10*   ------------------------------------------------------------  RADIOLOGY:   No results found for this or any previous visit. Results for orders placed during the hospital encounter of 12/22/16  DG Chest 2 View   Narrative CLINICAL DATA:  Chest pain since last night  EXAM: CHEST  2 VIEW  COMPARISON:  March 09, 2016  FINDINGS: The mediastinal contour is normal. Heart size is enlarged. There is increased pulmonary interstitium bilaterally. Patchy consolidation of left lung base is noted. Bilateral pleural effusions are identified. The bony structures are stable.  IMPRESSION: Congestive heart failure.  Patchy consolidation of left lung base with left pleural effusion, underlying pneumonia is not excluded.   Electronically Signed   By: Abelardo Diesel M.D.   On: 12/22/2016 15:19    ------------------------------------------------------------------------------------------------------------------  Thank  you for allowing [facility] to assist in the care of your patient. Our recommendations are noted  above.  Please contact us if we can be of further service.   Marda Stalker, MD.  [facility] Office Number: [phone]  Patricia Pesa, M.D.  Merton Border, M.D  01/20/2017

## 2017-01-20 NOTE — Progress Notes (Signed)
Clot forming in venous chamber

## 2017-01-20 NOTE — Progress Notes (Signed)
tx ended     

## 2017-01-20 NOTE — Progress Notes (Signed)
F/u bp  

## 2017-01-20 NOTE — NC FL2 (Signed)
[place] LEVEL OF CARE SCREENING TOOL     IDENTIFICATION  Patient Name: [name] Birthdate: 08-29-1934 Sex: male Admission Date (Current Location): 01/18/2017  [place] and [place] Number:  Engineering geologist and Address:  [facility], [address] [place], [place] [address]      Provider Number: [id number]  Attending Physician Name and Address:  Nicholes Mango, MD  Relative Name and Phone Number:       Current Level of Care: Hospital Recommended Level of Care: [place] Prior Approval Number:    Date Approved/Denied:   PASRR Number:  [id number] )  Discharge Plan: SNF    Current Diagnoses: Patient Active Problem List   Diagnosis Date Noted  . Pleural effusion 01/18/2017  . Acute respiratory failure with hypoxia ([place]) 12/26/2016  . Congestive heart failure (CHF) ([place]) 12/22/2016  . Acute exacerbation of congestive heart failure ([place]) 03/09/2016  . Pneumonia 11/10/2014  . Hypoxia 11/10/2014  . Fluid overload 11/10/2014  . Elevated troponin 11/10/2014  . Healthcare-associated pneumonia 10/28/2014    Orientation RESPIRATION BLADDER Height & Weight     Self, Time, Situation, Place  O2 (3 liters oxygen) Incontinent Weight: 51 kg (112 lb 7 oz) Height:     BEHAVIORAL SYMPTOMS/MOOD NEUROLOGICAL BOWEL NUTRITION STATUS      Incontinent Diet (Diet: Renal/ carb modified. )  AMBULATORY STATUS COMMUNICATION OF NEEDS Skin   Extensive Assist Verbally Normal                       Personal Care Assistance Level of Assistance  Bathing, Feeding, Dressing Bathing Assistance: Limited assistance Feeding assistance: Independent Dressing Assistance: Limited assistance     Functional Limitations Info  Sight, Hearing, Speech Sight Info: Impaired Hearing Info: Adequate Speech Info: Adequate    SPECIAL CARE FACTORS FREQUENCY  PT (By licensed PT)  Dialysis patient      PT Frequency:  (5)               Contractures      Additional Factors Info  Code Status, Allergies Code Status Info:  (partial code. ) Allergies Info:  (no known allergies. )           Current Medications (01/20/2017):  This is the current hospital active medication list Current Facility-Administered Medications  Medication Dose Route Frequency Provider Last Rate Last Dose  . amLODipine (NORVASC) tablet 10 mg  10 mg Oral Daily Vaughan Basta, MD   Stopped at 01/20/17 0820  . aspirin EC tablet 81 mg  81 mg Oral Daily Vaughan Basta, MD   81 mg at 01/20/17 0811  . carvedilol (COREG) tablet 25 mg  25 mg Oral BID WC Vaughan Basta, MD   25 mg at 01/20/17 0811  . docusate sodium (COLACE) capsule 100 mg  100 mg Oral BID PRN Vaughan Basta, MD      . feeding supplement (NEPRO CARB STEADY) liquid 237 mL  237 mL Oral TID BM Vaughan Basta, MD   237 mL at 01/19/17 2048  . furosemide (LASIX) tablet 80 mg  80 mg Oral Daily Vaughan Basta, MD   80 mg at 01/19/17 1143  . gabapentin (NEURONTIN) capsule 400 mg  400 mg Oral Daily Vaughan Basta, MD   400 mg at 01/20/17 0811  . guaiFENesin-codeine 100-10 MG/5ML solution 10 mL  10 mL Oral Q4H PRN Dorene Sorrow S, NP   10 mL at 01/18/17 2306  . heparin injection 5,000 Units  5,000 Units Subcutaneous Q8H Vaughan Basta, MD   5,000 Units at 01/20/17 1950  . hydrALAZINE (APRESOLINE) injection 10-20 mg  10-20 mg Intravenous Q4H PRN Dorene Sorrow S, NP   10 mg at 01/19/17 0000  . HYDROcodone-acetaminophen (NORCO/VICODIN) 5-325 MG per tablet 1 tablet  1 tablet Oral Q4H PRN Lance Coon, MD      . hydrOXYzine (ATARAX/VISTARIL) tablet 50 mg  50 mg Oral TID PRN Lance Coon, MD   50 mg at 01/18/17 2123  . insulin aspart (novoLOG) injection 0-15 Units  0-15 Units Subcutaneous TID WC Wilhelmina Mcardle, MD   8 Units at 01/20/17 0810  . insulin aspart (novoLOG) injection 0-5 Units  0-5 Units Subcutaneous QHS Wilhelmina Mcardle,  MD   2 Units at 01/19/17 2143  . ipratropium-albuterol (DUONEB) 0.5-2.5 (3) MG/3ML nebulizer solution 3 mL  3 mL Nebulization Q6H PRN Tukov, Magadalene S, NP      . lisinopril (PRINIVIL,ZESTRIL) tablet 40 mg  40 mg Oral Daily Vaughan Basta, MD   Stopped at 01/20/17 0820  . mometasone-formoterol (DULERA) 100-5 MCG/ACT inhaler 2 puff  2 puff Inhalation BID Vaughan Basta, MD   2 puff at 01/20/17 0900  . morphine 2 MG/ML injection 2 mg  2 mg Intravenous Q3H PRN Dorene Sorrow S, NP      . multivitamin (RENA-VIT) tablet 1 tablet  1 tablet Oral Daily Vaughan Basta, MD   1 tablet at 01/20/17 [phone]  . sertraline (ZOLOFT) tablet 25 mg  25 mg Oral Daily Vaughan Basta, MD   25 mg at 01/20/17 0811  . sevelamer carbonate (RENVELA) tablet 800 mg  800 mg Oral TID WC Vaughan Basta, MD   800 mg at 01/20/17 0811  . tiotropium (SPIRIVA) inhalation capsule 18 mcg  18 mcg Inhalation Daily Vaughan Basta, MD   18 mcg at 01/20/17 0900     Discharge Medications: Please see discharge summary for a list of discharge medications.  Relevant Imaging Results:  Relevant Lab Results:   Additional Information  (SSN: [id number])  Nicholes Mango, [place]

## 2017-01-20 NOTE — Progress Notes (Signed)
[facility] NAME: [name]    MR#:  [id number]  DATE OF BIRTH:  [birth year]  SUBJECTIVE:  CHIEF COMPLAINT:  Patient is legally blind feeling better after thoracentesis. Shortness of breath is better for dialysis today  REVIEW OF SYSTEMS:  CONSTITUTIONAL: No fever, fatigue or weakness.  EYES: No blurred or double vision.  EARS, NOSE, AND THROAT: No tinnitus or ear pain.  RESPIRATORY: improved cough, shortness of breath, denies wheezing or hemoptysis.  CARDIOVASCULAR: No chest pain, orthopnea, edema.  GASTROINTESTINAL: No nausea, vomiting, diarrhea or abdominal pain.  GENITOURINARY: No dysuria, hematuria.  ENDOCRINE: No polyuria, nocturia,  HEMATOLOGY: No anemia, easy bruising or bleeding SKIN: No rash or lesion. MUSCULOSKELETAL: No joint pain or arthritis.   NEUROLOGIC: No tingling, numbness, weakness.  PSYCHIATRY: No anxiety or depression.   DRUG ALLERGIES:  No Known Allergies  VITALS:  Blood pressure (!) 153/61, pulse 67, temperature 97.8 F (36.6 C), temperature source Oral, resp. rate 16, weight 51 kg (112 lb 7 oz), SpO2 93 %.  PHYSICAL EXAMINATION:  GENERAL:  82 y.o.-year-old patient lying in the bed with no acute distress.  EYES: Pupils equal, round, reactive to light and accommodation. No scleral icterus. Extraocular muscles intact.  HEENT: Head atraumatic, normocephalic. Oropharynx and nasopharynx clear.  NECK:  Supple, no jugular venous distention. No thyroid enlargement, no tenderness.  LUNGS: Normal breath sounds bilaterally, no wheezing, rales,rhonchi or crepitation. No use of accessory muscles of respiration.  CARDIOVASCULAR: S1, S2 normal. No murmurs, rubs, or gallops.  ABDOMEN: Soft, nontender, nondistended. Bowel sounds present. No organomegaly or mass.  EXTREMITIES: left lower extremity AKA No pedal edema, cyanosis, or clubbing.  NEUROLOGIC: Cranial nerves II through XII are intact.  Sensation intact.  Gait not checked.  PSYCHIATRIC: The patient is alert and oriented x 3.  SKIN: No obvious rash, lesion, or ulcer.    LABORATORY PANEL:   CBC  Recent Labs Lab 01/20/17 0558  WBC 7.3  HGB 9.9*  HCT 32.4*  PLT 327   ------------------------------------------------------------------------------------------------------------------  Chemistries   Recent Labs Lab 01/18/17 1604  01/20/17 0558  NA 139  < > 140  K 3.4*  < > 5.1  CL 95*  < > 96*  CO2 29  < > 25  GLUCOSE 186*  < > 304*  BUN 27*  < > 74*  CREATININE 3.63*  < > 6.83*  CALCIUM 7.6*  < > 6.6*  AST 43*  --   --   ALT 22  --   --   ALKPHOS 121  --   --   BILITOT 1.1  --   --   < > = values in this interval not displayed. ------------------------------------------------------------------------------------------------------------------  Cardiac Enzymes  Recent Labs Lab 01/18/17 1604  TROPONINI 0.10*   ------------------------------------------------------------------------------------------------------------------  RADIOLOGY:  Dg Chest Port 1 View  Result Date: 01/19/2017 CLINICAL DATA:  Status post left thoracentesis. EXAM: PORTABLE CHEST 1 VIEW COMPARISON:  Radiograph of January 18, 2017. FINDINGS: Stable cardiomediastinal silhouette. Atherosclerosis of thoracic aorta is noted. No pneumothorax is noted. Left pleural effusion is nearly resolved status post thoracentesis. Mild bibasilar edema is noted. Minimal right pleural effusion is noted. Bony thorax is unremarkable. IMPRESSION: Aortic atherosclerosis. Left pleural effusion is nearly resolved status post thoracentesis. Mild bibasilar edema is noted. Electronically Signed   By: Marijo Conception, M.D.   On: 01/19/2017 12:10   Dg Chest Port 1 View  Result Date: 01/18/2017 CLINICAL DATA:  Weakness  and decreased oxygen saturation at dialysis today. EXAM: PORTABLE CHEST 1 VIEW COMPARISON:  Single-view of the chest 12/25/2016. CT chest 12/26/2016. FINDINGS:  Moderately large left and small right pleural effusion are again seen. Pulmonary edema is also again seen and not notably changed. Cardiomegaly noted. Aortic atherosclerosis is seen. IMPRESSION: No change in pulmonary edema and left much greater than right pleural effusions. Electronically Signed   By: Inge Rise M.D.   On: 01/18/2017 16:12   US Thoracentesis Asp Pleural Space W/img Guide  Result Date: 01/19/2017 INDICATION: End-stage renal disease, CHF, bilateral pleural effusions left greater than right, dyspnea; request received for diagnostic and therapeutic left thoracentesis. EXAM: ULTRASOUND GUIDED DIAGNOSTIC AND THERAPEUTIC LEFT THORACENTESIS MEDICATIONS: None. COMPLICATIONS: None immediate. PROCEDURE: An ultrasound guided thoracentesis was thoroughly discussed with the patient and questions answered. The benefits, risks, alternatives and complications were also discussed. The patient understands and wishes to proceed with the procedure. Written consent was obtained. Ultrasound was performed to localize and mark an adequate pocket of fluid in the left chest. The area was then prepped and draped in the normal sterile fashion. 1% Lidocaine was used for local anesthesia. Under ultrasound guidance a Yueh catheter was introduced. Thoracentesis was performed. The catheter was removed and a dressing applied. FINDINGS: A total of approximately 2.2 liters of hazy, amber/blood-tinged fluid was removed. Samples were sent to the laboratory as requested by the clinical team. IMPRESSION: Successful ultrasound guided diagnostic and therapeutic left thoracentesis yielding 2.2 of pleural fluid. Read by: Rowe Robert, PA-C Electronically Signed   By: Inez Catalina M.D.   On: 01/19/2017 11:15    EKG:   Orders placed or performed during the hospital encounter of 01/18/17  . ED EKG  . ED EKG  . EKG 12-Lead  . EKG 12-Lead    ASSESSMENT AND PLAN:     * Acute  on Ch respi failure secondary to volume  overload/CHF   Due to large left and moderate right pleural effusion Transferred to intensive care unit overnight and patient was placed on BiPAP Had a thoracentesis done  today patient had 2.2 L of fluid extraction On high flow oxygen during my examination Pulmonology is following if patient is clinically improving and transferred to the floor Continue Lasix and Coreg     * ESRD on HD   COnt HD, nephro to follow-up For hemodialysis today  * Htn   Cont home meds Coreg, Norvasc and hydralazine IV as needed  * COPD   No exacerbation,nebulizer treatments as needed  * Ch diastolic CHF   No exacerbation.   Monitor daily weights Continue Lasix 80 mg, Coreg and aspirin.     All the records are reviewed and case discussed with Care Management/Social Workerr. Management plans discussed with the patient, daughter at bedside and they are in agreement.  CODE STATUS: partial   TOTAL TIME TAKING CARE OF THIS PATIENT: 32 minutes.   POSSIBLE D/C IN am  DAYS, DEPENDING ON CLINICAL CONDITION.  Note: This dictation was prepared with Dragon dictation along with smaller phrase technology. Any transcriptional errors that result from this process are unintentional.   Nicholes Mango M.D on 01/20/2017 at 3:14 PM  Between 7am to 6pm - Pager - [phone] After 6pm go to [email or web address] - password [email or web address] [facility]  [place] [facility]  Office  [phone]  CC: Primary care physician; Rinaldo Cloud, MD

## 2017-01-20 NOTE — Progress Notes (Signed)
HD treatment initiated. 

## 2017-01-20 NOTE — Progress Notes (Signed)
CBG 86 For ID # [id number] unable to recognize patient.

## 2017-01-20 NOTE — Clinical Social Work Note (Signed)
Clinical Social Work Assessment  Patient Details  Name: [name] MRN: [id number] Date of Birth: 09-23-1934  Date of referral:  01/20/17               Reason for consult:  Other (Comment Required) (From [facility] )                Permission sought to share information with:  Chartered certified accountant granted to share information::  Yes, Verbal Permission Granted  Name::      [place]::   [place]   Relationship::     Contact Information:     Housing/Transportation Living arrangements for the past 2 months:  [place] of Information:  Patient, Facility, Adult Children Patient Interpreter Needed:  None Criminal Activity/Legal Involvement Pertinent to Current Situation/Hospitalization:  No - Comment as needed Significant Relationships:  Adult Children Lives with:  Adult Children, Spouse Do you feel safe going back to the place where you live?  Yes Need for family participation in patient care:  Yes (Comment)  Care giving concerns:  Patient is from [facility] under [facility].    Social Worker assessment / plan:  Holiday representative (CSW) reviewed chart and noted that patient is from [facility]. Per [facility] liaison patient is from [facility] for short term rehab under [facility]. Per Broadus John patient can return to [facility] when stable. CSW spoke with Jerrell Belfast from [facility] who confirmed that patient is from [facility]. Per Jerrell Belfast [facility] is in the process of finding patient a long term care SNF bed. Per Jerrell Belfast [facility] is agreeable for patient to return to [facility]. Patient is on dialysis MWF at 10 am [address] CSW met with patient to discuss D/C plan. Patient was alert and oriented X4 and reported that he has been at [facility] for 2-3 weeks and is agreeable to return there. CSW contacted patient's daughter [name] and made her aware of above. Per daughter patient's wife Murray Hodgkins has dementia and lives at [facility]. Daughter is agreeable for patient to return to  [facility]. FL2 complete. CSW will continue to follow and assist as needed.   Employment status:  Retired Forensic scientist:  Other (Comment Required) ([facility] ) PT Recommendations:  Not assessed at this time Information / Referral to community resources:  [place]  Patient/Family's Response to care:  Patient and his daughter are agreeable for patient to return to [facility].   Patient/Family's Understanding of and Emotional Response to Diagnosis, Current Treatment, and Prognosis:  Patient and his daughter were very pleasant and thanked CSW for assistance.   Emotional Assessment Appearance:  Appears stated age Attitude/Demeanor/Rapport:    Affect (typically observed):  Accepting, Adaptable, Pleasant Orientation:  Oriented to Self, Oriented to Place, Oriented to  Time, Oriented to Situation Alcohol / Substance use:  Not Applicable Psych involvement (Current and /or in the community):  No (Comment)  Discharge Needs  Concerns to be addressed:  Discharge Planning Concerns Readmission within the last 30 days:  No Current discharge risk:  Dependent with Mobility, Chronically ill Barriers to Discharge:  Continued Medical Work up   [employer], Veronia Beets, LCSW 01/20/2017, 12:54 PM

## 2017-01-20 NOTE — Progress Notes (Addendum)
Inpatient Diabetes Program Recommendations  AACE/ADA: New Consensus Statement on Inpatient Glycemic Control (2015)  Target Ranges:  Prepandial:   less than 140 mg/dL      [facility] postprandial:   less than 180 mg/dL (1-2 hours)      Critically ill patients:  140 - 180 mg/dL   Results for [facility] (MRN [id number]) as of 01/20/2017 10:59  Ref. Range 01/18/2017 22:22 01/19/2017 11:37 01/19/2017 17:09 01/19/2017 21:36 01/20/2017 07:46  Glucose-Capillary Latest Ref Range: 65 - 99 mg/dL 295 (H) 331 (H) 113 (H) 226 (H) 279 (H)   Review of Glycemic Control  Diabetes history: DM2 Outpatient Diabetes medications: Novolog 4 units with meals if CBG 200-300 mg/dl or 6 units if > 301 mg/dl Current orders for Inpatient glycemic control: Novolog 0-15 units TID with meals, Novolog 0-5 units QHS  Inpatient Diabetes Program Recommendations: Insulin - Basal: Fasting glucose 279 mg/dl this morning. If glucose continues to be greater than 180 mg/dl, please consider ordering Levemir 5 units Q24H.  Thanks, Barnie Alderman, RN, MSN, CDE Diabetes Coordinator Inpatient Diabetes Program [phone] (Team Pager from 8am to 5pm)

## 2017-01-20 NOTE — NC FL2 (Signed)
[place] LEVEL OF CARE SCREENING TOOL     IDENTIFICATION  Patient Name: [name] Birthdate: 1935/01/29 Sex: male Admission Date (Current Location): 01/18/2017  [place] and [place] Number:  Engineering geologist and Address:  [facility], [address] [place], [place] [address]      Provider Number: [id number]  Attending Physician Name and Address:  Nicholes Mango, MD  Relative Name and Phone Number:       Current Level of Care: Hospital Recommended Level of Care: [place] Prior Approval Number:    Date Approved/Denied:   PASRR Number:  [id number] )  Discharge Plan: SNF    Current Diagnoses: Patient Active Problem List   Diagnosis Date Noted  . Pleural effusion 01/18/2017  . Acute respiratory failure with hypoxia ([place]) 12/26/2016  . Congestive heart failure (CHF) ([place]) 12/22/2016  . Acute exacerbation of congestive heart failure ([place]) 03/09/2016  . Pneumonia 11/10/2014  . Hypoxia 11/10/2014  . Fluid overload 11/10/2014  . Elevated troponin 11/10/2014  . Healthcare-associated pneumonia 10/28/2014    Orientation RESPIRATION BLADDER Height & Weight     Self, Time, Situation, Place  O2 (3 liters oxygen) Incontinent Weight:   Height:     BEHAVIORAL SYMPTOMS/MOOD NEUROLOGICAL BOWEL NUTRITION STATUS      Incontinent Diet (Diet: Renal/ carb modified. )  AMBULATORY STATUS COMMUNICATION OF NEEDS Skin   Extensive Assist Verbally Normal                       Personal Care Assistance Level of Assistance  Bathing, Feeding, Dressing Bathing Assistance: Limited assistance Feeding assistance: Independent Dressing Assistance: Limited assistance     Functional Limitations Info  Sight, Hearing, Speech Sight Info: Impaired Hearing Info: Adequate Speech Info: Adequate    SPECIAL CARE FACTORS FREQUENCY  PT (By licensed PT)  Dialysis patient      PT Frequency:  (5)              Contractures       Additional Factors Info  Code Status, Allergies Code Status Info:  (partial code. ) Allergies Info:  (no known allergies. )           Current Medications (01/20/2017):  This is the current hospital active medication list Current Facility-Administered Medications  Medication Dose Route Frequency Provider Last Rate Last Dose  . amLODipine (NORVASC) tablet 10 mg  10 mg Oral Daily Vaughan Basta, MD   Stopped at 01/20/17 0820  . aspirin EC tablet 81 mg  81 mg Oral Daily Vaughan Basta, MD   81 mg at 01/20/17 0811  . carvedilol (COREG) tablet 25 mg  25 mg Oral BID WC Vaughan Basta, MD   25 mg at 01/20/17 0811  . docusate sodium (COLACE) capsule 100 mg  100 mg Oral BID PRN Vaughan Basta, MD      . feeding supplement (NEPRO CARB STEADY) liquid 237 mL  237 mL Oral TID BM Vaughan Basta, MD   237 mL at 01/19/17 2048  . furosemide (LASIX) tablet 80 mg  80 mg Oral Daily Vaughan Basta, MD   80 mg at 01/19/17 1143  . gabapentin (NEURONTIN) capsule 400 mg  400 mg Oral Daily Vaughan Basta, MD   400 mg at 01/20/17 0811  . guaiFENesin-codeine 100-10 MG/5ML solution 10 mL  10 mL Oral Q4H PRN Dorene Sorrow S, NP   10 mL at 01/18/17 2306  . heparin injection 5,000 Units  5,000 Units Subcutaneous Q8H  Vaughan Basta, MD   5,000 Units at 01/20/17 [phone]  . hydrALAZINE (APRESOLINE) injection 10-20 mg  10-20 mg Intravenous Q4H PRN Dorene Sorrow S, NP   10 mg at 01/19/17 0000  . HYDROcodone-acetaminophen (NORCO/VICODIN) 5-325 MG per tablet 1 tablet  1 tablet Oral Q4H PRN Lance Coon, MD      . hydrOXYzine (ATARAX/VISTARIL) tablet 50 mg  50 mg Oral TID PRN Lance Coon, MD   50 mg at 01/18/17 2123  . insulin aspart (novoLOG) injection 0-15 Units  0-15 Units Subcutaneous TID WC Wilhelmina Mcardle, MD   8 Units at 01/20/17 0810  . insulin aspart (novoLOG) injection 0-5 Units  0-5 Units Subcutaneous QHS Wilhelmina Mcardle, MD   2 Units at  01/19/17 2143  . ipratropium-albuterol (DUONEB) 0.5-2.5 (3) MG/3ML nebulizer solution 3 mL  3 mL Nebulization Q6H PRN Tukov, Magadalene S, NP      . lisinopril (PRINIVIL,ZESTRIL) tablet 40 mg  40 mg Oral Daily Vaughan Basta, MD   Stopped at 01/20/17 0820  . mometasone-formoterol (DULERA) 100-5 MCG/ACT inhaler 2 puff  2 puff Inhalation BID Vaughan Basta, MD   2 puff at 01/19/17 2049  . morphine 2 MG/ML injection 2 mg  2 mg Intravenous Q3H PRN Dorene Sorrow S, NP      . multivitamin (RENA-VIT) tablet 1 tablet  1 tablet Oral Daily Vaughan Basta, MD   1 tablet at 01/20/17 [phone]  . sertraline (ZOLOFT) tablet 25 mg  25 mg Oral Daily Vaughan Basta, MD   25 mg at 01/20/17 0811  . sevelamer carbonate (RENVELA) tablet 800 mg  800 mg Oral TID WC Vaughan Basta, MD   800 mg at 01/20/17 0811  . tiotropium (SPIRIVA) inhalation capsule 18 mcg  18 mcg Inhalation Daily Vaughan Basta, MD   18 mcg at 01/19/17 1145     Discharge Medications: Please see discharge summary for a list of discharge medications.  Relevant Imaging Results:  Relevant Lab Results:   Additional Information  (SSN: [id number])  [employer], Veronia Beets, LCSW

## 2017-01-21 LAB — GLUCOSE, CAPILLARY
GLUCOSE-CAPILLARY: 63 mg/dL — AB (ref 65–99)
GLUCOSE-CAPILLARY: 90 mg/dL (ref 65–99)
GLUCOSE-CAPILLARY: 208 mg/dL — AB (ref 65–99)
Glucose-Capillary: 325 mg/dL — ABNORMAL HIGH (ref 65–99)
Glucose-Capillary: 68 mg/dL (ref 65–99)

## 2017-01-21 MED ORDER — DOCUSATE SODIUM 100 MG PO CAPS: 100.0000 mg | ORAL_CAPSULE | Freq: Two times a day (BID) | ORAL | 0 refills | Status: AC | PRN

## 2017-01-21 MED ORDER — IPRATROPIUM-ALBUTEROL 0.5-2.5 (3) MG/3ML IN SOLN: 3.0000 mL | Freq: Four times a day (QID) | RESPIRATORY_TRACT | Status: AC | PRN

## 2017-01-21 NOTE — Progress Notes (Signed)
Patient discharged via [facility] to [facility].

## 2017-01-21 NOTE — Progress Notes (Signed)
* [facility]    ASSESSMENT / PLAN: Acute hypoxic respiratoy failure secondary to pulmonary edema with large left pleural effusion status post thoracentesis. Coughing spells-?bronchospasm?-Now improved. ESRD on HD H/O Hypertension CODE STATUS is limited code; no intubation but other interventions including CPR and ACLS meds are okay.  -Supplemental O2 wean down as tolerated -Continue outpatient Symbicort and Spiriva, presumably these were started for a diagnosis of chronic bronchitis and/or COPD. -Patient has been admitted twice this month for acute volume overload/CHF, and remains at high risk for recurrence, decompensation.  Palliative care consultation may be helpful. -Thus far pleural fluid results shows bloody tap with low protein, this appears to be consistent with a transudate, cytology is negative.   Patient appears stable for discharge from respiratory standpoint. Pulmonary service will sign off for now, please call if there are further questions or concerns.   Marda Stalker, MD.   Board Certified in Internal Medicine, Pulmonary Medicine, [place], and Sleep Medicine.   Pulmonary and Critical Care Office Number: [phone] Pager: [phone]  Patricia Pesa, M.D.  Merton Border, M.D    Date: 01/21/2017  MRN# [id number] [name] 04-30-1934   [name] is a 82 y.o. old male seen in follow up for chief complaint of  Chief Complaint  Patient presents with  . Weakness     HPI:  Patient is awake today, he has no particular complaints.  He feels that his breathing is doing better than it was before.  Echocardiogram 12/27/16; EF equals 55%; normal RV systolic function  Images personally reviewed; chest x-ray 10/23, 01/18/17 initial chest x-ray shows a moderate left-sided pleural effusion, postthoracentesis chest x-ray was reviewed that shows resolution of the effusion, with continued bilateral pulmonary edema.  Pleural  fluid Culture negative thus far Protein less than 3, suggestive of transudate   Medication:    Current Facility-Administered Medications:  .  amLODipine (NORVASC) tablet 10 mg, 10 mg, Oral, Daily, Vaughan Basta, MD, 10 mg at 01/20/17 1719 .  aspirin EC tablet 81 mg, 81 mg, Oral, Daily, Vaughan Basta, MD, 81 mg at 01/20/17 0811 .  carvedilol (COREG) tablet 25 mg, 25 mg, Oral, BID WC, Vaughan Basta, MD, 25 mg at 01/21/17 0808 .  docusate sodium (COLACE) capsule 100 mg, 100 mg, Oral, BID PRN, Vaughan Basta, MD .  feeding supplement (NEPRO CARB STEADY) liquid 237 mL, 237 mL, Oral, TID BM, Vaughan Basta, MD, Stopped at 01/20/17 2042 .  furosemide (LASIX) tablet 80 mg, 80 mg, Oral, Daily, Vaughan Basta, MD, 80 mg at 01/19/17 1143 .  gabapentin (NEURONTIN) capsule 400 mg, 400 mg, Oral, Daily, Vaughan Basta, MD, 400 mg at 01/20/17 0811 .  guaiFENesin-codeine 100-10 MG/5ML solution 10 mL, 10 mL, Oral, Q4H PRN, Tukov, Magadalene S, NP, 10 mL at 01/18/17 2306 .  heparin injection 5,000 Units, 5,000 Units, Subcutaneous, Q8H, Vaughan Basta, MD, 5,000 Units at 01/20/17 2136 .  hydrALAZINE (APRESOLINE) injection 10-20 mg, 10-20 mg, Intravenous, Q4H PRN, Tukov, Magadalene S, NP, 10 mg at 01/19/17 0000 .  HYDROcodone-acetaminophen (NORCO/VICODIN) 5-325 MG per tablet 1 tablet, 1 tablet, Oral, Q4H PRN, Lance Coon, MD .  hydrOXYzine (ATARAX/VISTARIL) tablet 50 mg, 50 mg, Oral, TID PRN, Lance Coon, MD, 50 mg at 01/18/17 2123 .  insulin aspart (novoLOG) injection 0-15 Units, 0-15 Units, Subcutaneous, TID WC, Wilhelmina Mcardle, MD, 11 Units at 01/21/17 [phone] .  insulin aspart (novoLOG) injection 0-5 Units, 0-5 Units, Subcutaneous, QHS, Wilhelmina Mcardle, MD, 2 Units at  01/19/17 2143 .  ipratropium-albuterol (DUONEB) 0.5-2.5 (3) MG/3ML nebulizer solution 3 mL, 3 mL, Nebulization, Q6H PRN, Tukov, Magadalene S, NP .  lisinopril  (PRINIVIL,ZESTRIL) tablet 40 mg, 40 mg, Oral, Daily, Vaughan Basta, MD, 40 mg at 01/20/17 1718 .  mometasone-formoterol (DULERA) 100-5 MCG/ACT inhaler 2 puff, 2 puff, Inhalation, BID, Vaughan Basta, MD, 2 puff at 01/21/17 0808 .  morphine 2 MG/ML injection 2 mg, 2 mg, Intravenous, Q3H PRN, Patria Mane, Magadalene S, NP .  multivitamin (RENA-VIT) tablet 1 tablet, 1 tablet, Oral, Daily, Vaughan Basta, MD, 1 tablet at 01/20/17 0811 .  sertraline (ZOLOFT) tablet 25 mg, 25 mg, Oral, Daily, Vaughan Basta, MD, 25 mg at 01/20/17 0811 .  sevelamer carbonate (RENVELA) tablet 800 mg, 800 mg, Oral, TID WC, Vaughan Basta, MD, 800 mg at 01/21/17 0808 .  tiotropium (SPIRIVA) inhalation capsule 18 mcg, 18 mcg, Inhalation, Daily, Vaughan Basta, MD, 18 mcg at 01/20/17 0900   Allergies:  Patient has no known allergies.  Review of Systems: Gen:  Denies  fever, sweats. HEENT: Denies blurred vision. Cvc:  No dizziness, chest pain or heaviness Resp:   Denies cough or sputum porduction. Gi: Denies swallowing difficulty, stomach pain. constipation, bowel incontinence Gu:  Denies bladder incontinence, burning urine Ext:   No Joint pain, stiffness. Skin: No skin rash, easy bruising. Endoc:  No polyuria, polydipsia. Psych: No depression, insomnia. Other:  All other systems were reviewed and found to be negative other than what is mentioned in the HPI.   Physical Examination:   VS: BP (!) 146/72 (BP Location: Left Arm) Comment: Notify Nurse   Pulse 76   Temp 97.6 F (36.4 C) (Oral)   Resp 18   Wt 114 lb 3.2 oz (51.8 kg)   SpO2 95%   BMI 18.43 kg/m   General Appearance: No distress  Neuro:without focal findings,  speech normal,  HEENT: PERRLA, EOM intact. Pulmonary: normal breath sounds, No wheezing.   CardiovascularNormal S1,S2.  No m/r/g.   Abdomen: Benign, Soft, non-tender. Renal:  No costovertebral tenderness  GU:  Not performed at this time. Endoc:  No evident thyromegaly, no signs of acromegaly. Skin:   warm, no rash. Extremities: normal, no cyanosis, clubbing.   LABORATORY PANEL:   CBC  Recent Labs Lab 01/20/17 0558  WBC 7.3  HGB 9.9*  HCT 32.4*  PLT 327   ------------------------------------------------------------------------------------------------------------------  Chemistries   Recent Labs Lab 01/18/17 1604  01/20/17 0558  NA 139  < > 140  K 3.4*  < > 5.1  CL 95*  < > 96*  CO2 29  < > 25  GLUCOSE 186*  < > 304*  BUN 27*  < > 74*  CREATININE 3.63*  < > 6.83*  CALCIUM 7.6*  < > 6.6*  AST 43*  --   --   ALT 22  --   --   ALKPHOS 121  --   --   BILITOT 1.1  --   --   < > = values in this interval not displayed. ------------------------------------------------------------------------------------------------------------------  Cardiac Enzymes  Recent Labs Lab 01/18/17 1604  TROPONINI 0.10*   ------------------------------------------------------------  RADIOLOGY:   No results found for this or any previous visit. Results for orders placed during the hospital encounter of 12/22/16  DG Chest 2 View   Narrative CLINICAL DATA:  Chest pain since last night  EXAM: CHEST  2 VIEW  COMPARISON:  March 09, 2016  FINDINGS: The mediastinal contour is normal. Heart size is enlarged. There is increased  pulmonary interstitium bilaterally. Patchy consolidation of left lung base is noted. Bilateral pleural effusions are identified. The bony structures are stable.  IMPRESSION: Congestive heart failure.  Patchy consolidation of left lung base with left pleural effusion, underlying pneumonia is not excluded.   Electronically Signed   By: Abelardo Diesel M.D.   On: 12/22/2016 15:19    ------------------------------------------------------------------------------------------------------------------  Thank  you for allowing [facility] to assist in the care of your patient.  Our recommendations are noted above.  Please contact us if we can be of further service.   Marda Stalker, MD.  [facility] Office Number: [phone]  Patricia Pesa, M.D.  Merton Border, M.D  01/21/2017

## 2017-01-21 NOTE — Discharge Instructions (Signed)
Follow-up with primary care physician in 3-4 days Follow-up with [facility] nephrology in 1-2 weeks Continue hemodialysis Continue 2-3 L of motion via nasal cannula

## 2017-01-21 NOTE — Progress Notes (Signed)
Patient is medically stable for D/C back to [facility] today. Per [facility] liaison patient can return today to room 501. RN will call report at [phone] and [facility] will pick up patient around 4 pm with a wheel chair and a portable oxygen tank. Clinical Education officer, museum (CSW) sent D/C orders to [facility] via [facility] and [facility]. Patient is aware of above. CSW contacted patient's daughter [name] and made her aware of above. Sharita with [facility] is aware of above. Please reconsult if future social work needs arise. CSW signing off.   [employer], LCSW [phone]

## 2017-01-21 NOTE — Progress Notes (Signed)
Pt alert but confused at times. Impulsive at times. No complaints of pain this shift. Pt needs constant reminder to keep on nasal canula. No complaints of shortness of breath.

## 2017-01-21 NOTE — Progress Notes (Signed)
Report called to Louenda,RN at [employer]. [name], patient's daughter made aware of discharge and requested patient be discharged in gown due to clothes not being present in room. [facility] nurse to provide transportation.

## 2017-01-21 NOTE — Discharge Summary (Signed)
[facility] [place] NAME: [name]    MR#:  [id number]  DATE OF BIRTH:  19-Oct-1934  DATE OF ADMISSION:  01/18/2017 ADMITTING PHYSICIAN: Vaughan Basta, MD  DATE OF DISCHARGE:  01/21/17  PRIMARY CARE PHYSICIAN: Rinaldo Cloud, MD    ADMISSION DIAGNOSIS:  Shortness of breath [R06.02] Respiratory failure (HCC) [J96.90] Pleural effusion [J90] Elevated troponin [R74.8]  DISCHARGE DIAGNOSIS:  Principal Problem:   Acute respiratory failure with hypoxia (HCC) Active Problems:   Pleural effusion  Legally blind SECONDARY DIAGNOSIS:   Past Medical History:  Diagnosis Date  . Anemia of chronic disease   . 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 Blindness of both eyes   . Blood in stool   . Cancer [facility])    prostate  . CHF (congestive heart failure) (HCC)    chronic diastolic heart failure  . Chronic kidney disease   . Coronary artery disease    aortic atherosclerosis  . Depression   . Diabetes mellitus without complication ([place])   . Edentulism   . ESRD (end stage renal disease) ([place])    dialysis-[place] (M<W<F)  . Fecal incontinence   . Glaucoma   . Heart murmur    occasional irreg beat  . History of fall   . History of leg amputation ([place]) 1991   AK left  . Hypertension   . Iritis   . MGUS (monoclonal gammopathy of unknown significance)   . Neuromuscular disorder (HCC)    neuropathy  . Nocturnal cough   . Peripheral vascular disease ([place])   . Pneumonia    hx of  . Protein calorie malnutrition ([place])   . Renal cyst   . Wheelchair bound     HOSPITAL COURSE:   * Acute  on Ch respi failure secondary to volume overload/CHF from large left and moderate right pleural effusion, Transferred to intensive care unit overnight and patient was placed on BiPAP Had a thoracentesis done ; patient had 2.2 L of fluid extraction Clinical condition significantly improved and back to his  baseline Continue Lasix and Coreg  * ESRD on HD COnt HD, nephro to follow-up Continue hemodialysis on Monday, Wednesday and Friday  * Htn Cont home meds Coreg, Norvasc and hydralazine IV as needed  * COPD No exacerbation,nebulizer treatments as needed  * Ch diastolic CHF No exacerbation. Monitor daily weights Continue Lasix 80 mg, Coreg and aspirin.  DISCHARGE CONDITIONS:   Stable  CONSULTS OBTAINED:  Treatment Team:  Murlean Iba, MD   PROCEDURES thoracentesis and hemodialysis  DRUG ALLERGIES:  No Known Allergies  DISCHARGE MEDICATIONS:   Current Discharge Medication List    START taking these medications   Details  docusate sodium (COLACE) 100 MG capsule Take 1 capsule (100 mg total) by mouth 2 (two) times daily as needed for mild constipation. Qty: 10 capsule, Refills: 0    ipratropium-albuterol (DUONEB) 0.5-2.5 (3) MG/3ML SOLN Take 3 mLs by nebulization every 6 (six) hours as needed. Qty: 360 mL      CONTINUE these medications which have NOT CHANGED   Details  acetaminophen (TYLENOL) 500 MG tablet Take 500-1,000 mg by mouth every 12 (twelve) hours as needed for mild pain or moderate pain.     albuterol (PROVENTIL HFA;VENTOLIN HFA) 108 (90 Base) MCG/ACT inhaler Inhale 2 puffs into the lungs every 6 (six) hours as needed for wheezing or shortness of breath. Qty: 1 Inhaler, Refills: 0    amLODipine (  NORVASC) 10 MG tablet Take 1 tablet (10 mg total) by mouth daily. Qty: 30 tablet, Refills: 0    aspirin EC 81 MG tablet Take 81 mg by mouth daily.    bismuth subsalicylate (PEPTO BISMOL) 262 MG/15ML suspension Take 30 mLs by mouth every 6 (six) hours as needed.    budesonide-formoterol (SYMBICORT) 80-4.5 MCG/ACT inhaler Inhale 2 puffs into the lungs 2 (two) times daily. Qty: 1 Inhaler, Refills: 0    carvedilol (COREG) 25 MG tablet Take 1 tablet (25 mg total) by mouth 2 (two) times daily with a meal. Qty: 60 tablet, Refills: 0     dextromethorphan-guaiFENesin (ROBITUSSIN-DM) 10-100 MG/5ML liquid Take 10 mLs by mouth every 6 (six) hours as needed for cough.    dorzolamide-timolol (COSOPT) 22.3-6.8 MG/ML ophthalmic solution Place 1 drop into the right eye 2 (two) times daily.    ergocalciferol (VITAMIN D2) 50000 units capsule Take 50,000 Units by mouth every 30 (thirty) days.    furosemide (LASIX) 80 MG tablet Take 1 tablet (80 mg total) by mouth daily. Qty: 30 tablet, Refills: 0    gabapentin (NEURONTIN) 400 MG capsule Take 400 mg by mouth daily.     insulin aspart (NOVOLOG) 100 UNIT/ML injection If sugars over 200- 300 can give 4 units with meal; if sugar over 301 can give 6 units with meal Qty: 10 mL, Refills: 0    lisinopril (PRINIVIL,ZESTRIL) 40 MG tablet Take 40 mg by mouth daily.     multivitamin (RENA-VIT) TABS tablet Take 1 tablet by mouth daily.    nitroGLYCERIN (NITROSTAT) 0.3 MG SL tablet Place 0.3 mg under the tongue every 5 (five) minutes as needed for chest pain.    Nutritional Supplements (FEEDING SUPPLEMENT, NEPRO CARB STEADY,) LIQD Take 237 mLs by mouth 3 (three) times daily between meals. Qty: 90 Can, Refills: 0    ondansetron (ZOFRAN-ODT) 4 MG disintegrating tablet Take 4 mg by mouth every 8 (eight) hours as needed for nausea or vomiting.    polycarbophil (FIBERCON) 625 MG tablet Take 1,250 mg by mouth daily.    promethazine 25 mg in sodium chloride 0.9 % 1,000 mL Inject 12.5 mg into the vein once.    sertraline (ZOLOFT) 25 MG tablet Take 25 mg by mouth daily.    sevelamer (RENAGEL) 800 MG tablet Take 1,600 mg by mouth 3 (three) times daily with meals.    tiotropium (SPIRIVA HANDIHALER) 18 MCG inhalation capsule Place 1 capsule (18 mcg total) into inhaler and inhale daily. Qty: 30 capsule, Refills: 0    Influenza vac split quadrivalent PF (FLUZONE HIGH-DOSE) 0.5 ML injection Inject 0.5 mLs into the muscle once.         DISCHARGE INSTRUCTIONS:   Follow-up with primary care  physician at the facility in 2-3 days Continue hemodialysis Outpatient follow-up with [facility] in 1-2 weeks   DIET:  Renal diet  DISCHARGE CONDITION:  Fair  ACTIVITY:  Activity as tolerated  OXYGEN:  Home Oxygen: Yes.     Oxygen Delivery: 3 liters/min via Patient connected to nasal cannula oxygen  DISCHARGE LOCATION:  nursing home   If you experience worsening of your admission symptoms, develop shortness of breath, life threatening emergency, suicidal or homicidal thoughts you must seek medical attention immediately by calling 911 or calling your MD immediately  if symptoms less severe.  You Must read complete instructions/literature along with all the possible adverse reactions/side effects for all the Medicines you take and that have been prescribed to you. Take any new  Medicines after you have completely understood and accpet all the possible adverse reactions/side effects.   Please note  You were cared for by a hospitalist during your hospital stay. If you have any questions about your discharge medications or the care you received while you were in the hospital after you are discharged, you can call the unit and asked to speak with the hospitalist on call if the hospitalist that took care of you is not available. Once you are discharged, your primary care physician will handle any further medical issues. Please note that NO REFILLS for any discharge medications will be authorized once you are discharged, as it is imperative that you return to your primary care physician (or establish a relationship with a primary care physician if you do not have one) for your aftercare needs so that they can reassess your need for medications and monitor your lab values.     Today  Chief Complaint  Patient presents with  . Weakness   Patient is feeling fine. Denies any shortness of breath. Agreeable to go back to nursing home  ROS:  CONSTITUTIONAL: Denies fevers, chills. Denies  any fatigue, weakness.  EYES: Denies blurry vision, double vision, eye pain. EARS, NOSE, THROAT: Denies tinnitus, ear pain, hearing loss. RESPIRATORY: Denies cough, wheeze, shortness of breath.  CARDIOVASCULAR: Denies chest pain, palpitations, edema.  GASTROINTESTINAL: Denies nausea, vomiting, diarrhea, abdominal pain. Denies bright red blood per rectum. GENITOURINARY: Denies dysuria, hematuria. ENDOCRINE: Denies nocturia or thyroid problems. HEMATOLOGIC AND LYMPHATIC: Denies easy bruising or bleeding. SKIN: Denies rash or lesion. MUSCULOSKELETAL: Denies pain in neck, back, shoulder, knees, hips or arthritic symptoms.  NEUROLOGIC: Denies paralysis, paresthesias.  PSYCHIATRIC: Denies anxiety or depressive symptoms.   VITAL SIGNS:  Blood pressure (!) 138/56, pulse 73, temperature 97.6 F (36.4 C), temperature source Oral, resp. rate 18, weight 51.8 kg (114 lb 3.2 oz), SpO2 95 %.  I/O:    Intake/Output Summary (Last 24 hours) at 01/21/17 1436 Last data filed at 01/21/17 1423  Gross per 24 hour  Intake              120 ml  Output                0 ml  Net              120 ml    PHYSICAL EXAMINATION:  GENERAL:  82 y.o.-year-old patient lying in the bed with no acute distress.  EYES: Pupils equal, round, reactive to light and accommodation. No scleral icterus. Extraocular muscles intact.  HEENT: Head atraumatic, normocephalic. Oropharynx and nasopharynx clear.  NECK:  Supple, no jugular venous distention. No thyroid enlargement, no tenderness.  LUNGS: Normal breath sounds bilaterally, no wheezing, rales,rhonchi or crepitation. No use of accessory muscles of respiration.  CARDIOVASCULAR: S1, S2 normal. No murmurs, rubs, or gallops.  ABDOMEN: Soft, non-tender, non-distended. Bowel sounds present. No organomegaly or mass.  EXTREMITIES: Left lower extremity AKA No pedal edema, cyanosis, or clubbing.  NEUROLOGIC: Cranial nerves II through XII are intact. Muscle strength 5/5 in all  extremities. Sensation intact. Gait not checked.  PSYCHIATRIC: The patient is alert and oriented x 3.  SKIN: No obvious rash, lesion, or ulcer.   DATA REVIEW:   CBC  Recent Labs Lab 01/20/17 0558  WBC 7.3  HGB 9.9*  HCT 32.4*  PLT 327    Chemistries   Recent Labs Lab 01/18/17 1604  01/20/17 0558  NA 139  < > 140  K 3.4*  < >  5.1  CL 95*  < > 96*  CO2 29  < > 25  GLUCOSE 186*  < > 304*  BUN 27*  < > 74*  CREATININE 3.63*  < > 6.83*  CALCIUM 7.6*  < > 6.6*  AST 43*  --   --   ALT 22  --   --   ALKPHOS 121  --   --   BILITOT 1.1  --   --   < > = values in this interval not displayed.  Cardiac Enzymes  Recent Labs Lab 01/18/17 1604  TROPONINI 0.10*    Microbiology Results  Results for orders placed or performed during the hospital encounter of 01/18/17  MRSA PCR Screening     Status: None   Collection Time: 01/18/17  8:05 PM  Result Value Ref Range Status   MRSA by PCR NEGATIVE NEGATIVE Final    Comment:        The GeneXpert MRSA Assay (FDA approved for NASAL specimens only), is one component of a comprehensive MRSA colonization surveillance program. It is not intended to diagnose MRSA infection nor to guide or monitor treatment for MRSA infections.   Body fluid culture     Status: None (Preliminary result)   Collection Time: 01/19/17 10:45 AM  Result Value Ref Range Status   Specimen Description PLEURAL  Final   Special Requests NONE  Final   Gram Stain   Final    RARE WBC PRESENT, PREDOMINANTLY MONONUCLEAR NO ORGANISMS SEEN    Culture   Final    NO GROWTH 2 DAYS Performed at [facility], [address], [place], [place] [address]    Report Status PENDING  Incomplete    RADIOLOGY:  Dg Chest Port 1 View  Result Date: 01/19/2017 CLINICAL DATA:  Status post left thoracentesis. EXAM: PORTABLE CHEST 1 VIEW COMPARISON:  Radiograph of January 18, 2017. FINDINGS: Stable cardiomediastinal silhouette. Atherosclerosis of thoracic aorta is  noted. No pneumothorax is noted. Left pleural effusion is nearly resolved status post thoracentesis. Mild bibasilar edema is noted. Minimal right pleural effusion is noted. Bony thorax is unremarkable. IMPRESSION: Aortic atherosclerosis. Left pleural effusion is nearly resolved status post thoracentesis. Mild bibasilar edema is noted. Electronically Signed   By: Marijo Conception, M.D.   On: 01/19/2017 12:10   Dg Chest Port 1 View  Result Date: 01/18/2017 CLINICAL DATA:  Weakness and decreased oxygen saturation at dialysis today. EXAM: PORTABLE CHEST 1 VIEW COMPARISON:  Single-view of the chest 12/25/2016. CT chest 12/26/2016. FINDINGS: Moderately large left and small right pleural effusion are again seen. Pulmonary edema is also again seen and not notably changed. Cardiomegaly noted. Aortic atherosclerosis is seen. IMPRESSION: No change in pulmonary edema and left much greater than right pleural effusions. Electronically Signed   By: Inge Rise M.D.   On: 01/18/2017 16:12   US Thoracentesis Asp Pleural Space W/img Guide  Result Date: 01/19/2017 INDICATION: End-stage renal disease, CHF, bilateral pleural effusions left greater than right, dyspnea; request received for diagnostic and therapeutic left thoracentesis. EXAM: ULTRASOUND GUIDED DIAGNOSTIC AND THERAPEUTIC LEFT THORACENTESIS MEDICATIONS: None. COMPLICATIONS: None immediate. PROCEDURE: An ultrasound guided thoracentesis was thoroughly discussed with the patient and questions answered. The benefits, risks, alternatives and complications were also discussed. The patient understands and wishes to proceed with the procedure. Written consent was obtained. Ultrasound was performed to localize and mark an adequate pocket of fluid in the left chest. The area was then prepped and draped in the normal sterile  fashion. 1% Lidocaine was used for local anesthesia. Under ultrasound guidance a Yueh catheter was introduced. Thoracentesis was performed. The  catheter was removed and a dressing applied. FINDINGS: A total of approximately 2.2 liters of hazy, amber/blood-tinged fluid was removed. Samples were sent to the laboratory as requested by the clinical team. IMPRESSION: Successful ultrasound guided diagnostic and therapeutic left thoracentesis yielding 2.2 of pleural fluid. Read by: Rowe Robert, PA-C Electronically Signed   By: Inez Catalina M.D.   On: 01/19/2017 11:15    EKG:   Orders placed or performed during the hospital encounter of 01/18/17  . ED EKG  . ED EKG  . EKG 12-Lead  . EKG 12-Lead      Management plans discussed with the patient, family and they are in agreement.  CODE STATUS:     Code Status Orders        Start     Ordered   01/18/17 1853  Limited resuscitation (code)  Continuous    Question Answer Comment  In the event of cardiac or respiratory ARREST: Initiate Code Blue, Call Rapid Response Yes   In the event of cardiac or respiratory ARREST: Perform CPR Yes   In the event of cardiac or respiratory ARREST: Perform Intubation/Mechanical Ventilation No   In the event of cardiac or respiratory ARREST: Use NIPPV/BiPAp only if indicated Yes   In the event of cardiac or respiratory ARREST: Administer ACLS medications if indicated Yes   In the event of cardiac or respiratory ARREST: Perform Defibrillation or Cardioversion if indicated Yes      01/18/17 1852    Code Status History    Date Active Date Inactive Code Status Order ID Comments User Context   12/25/2016  6:52 PM 12/28/2016  7:56 PM Partial Code [id number]  SimserKathyrn Drown, RN Inpatient   12/25/2016  6:48 PM 12/25/2016  6:52 PM Partial Code [id number]  SimserKathyrn Drown, RN Inpatient   12/22/2016  8:18 PM 12/25/2016  6:48 PM Full Code [id number]  Baxter Hire, MD Inpatient   03/09/2016 10:35 PM 03/11/2016 11:21 PM Full Code [id number]  Harvie Bridge, DO Inpatient   11/10/2014  7:54 PM 11/15/2014  1:12 AM Full Code [id number]  Demetrios Loll, MD ED   10/28/2014  9:19  PM 10/31/2014  3:04 PM Full Code [id number]  Nicholes Mango, MD Inpatient    Advance Directive Documentation     Most Recent Value  Type of Advance Directive  Healthcare Power of [place], Living will  Pre-existing out of facility DNR order (yellow form or pink MOST form)  -  "MOST" Form in Place?  -      TOTAL TIME TAKING CARE OF THIS PATIENT: 43  minutes.   Note: This dictation was prepared with Dragon dictation along with smaller phrase technology. Any transcriptional errors that result from this process are unintentional.   @MEC @  on 01/21/2017 at 2:36 PM  Between 7am to 6pm - Pager - [phone]  After 6pm go to [email or web address] - password [email or web address] [facility]  [facility]  Office  [phone]  CC: Primary care physician; Rinaldo Cloud, MD

## 2017-01-21 NOTE — Progress Notes (Signed)
[facility], [place] 01/21/17  Subjective:   Patient is known to our practice from previous admissions.  He presented to the emergency room via EMS from the dialysis center for weakness and low oxygen saturation. He underwent Ultrasound-guided therapeutic left thoracentesis.  2.2 L of fluid was removed.  Patient felt better after the procedure.   This morning he feels well.  No acute complaints.  No shortness of breath. Tolerated dialysis well.  No issues.  Objective:  Vital signs in last 24 hours:  Temp:  [97.6 F (36.4 C)-98.4 F (36.9 C)] 97.6 F (36.4 C) (10/25 0737) Pulse Rate:  [65-76] 73 (10/25 1006) Resp:  [13-19] 18 (10/24 2040) BP: (110-168)/(56-79) 138/56 (10/25 1006) SpO2:  [85 %-97 %] 95 % (10/25 0737) Weight:  [50 kg (110 lb 3.7 oz)-51.8 kg (114 lb 3.2 oz)] 51.8 kg (114 lb 3.2 oz) (10/25 0646)  Weight change:  Filed Weights   01/20/17 1300 01/20/17 1600 01/21/17 0646  Weight: 51 kg (112 lb 7 oz) 50 kg (110 lb 3.7 oz) 51.8 kg (114 lb 3.2 oz)    Intake/Output:   No intake or output data in the 24 hours ending 01/21/17 1347   Physical Exam: General:  Laying in the bed no acute distress  HEENT  left corneal opacitiy  Neck  supple, no masses  Pulm/lungs  mild rhonchi, decreased breath sounds at bases  CVS/Heart  no rub  Abdomen:   Soft, nontender  Extremities:  Left AKA  Neurologic:  Alert, able to answer questions  Skin:  No acute rashes, normal turgor  Access:  Right arm AV fistula       Basic Metabolic Panel:   Recent Labs Lab 01/18/17 1604 01/19/17 0320 01/20/17 0558  NA 139 139 140  K 3.4* 3.4* 5.1  CL 95* 95* 96*  CO2 29 27 25   GLUCOSE 186* 364* 304*  BUN 27* 43* 74*  CREATININE 3.63* 5.02* 6.83*  CALCIUM 7.6* 6.6* 6.6*     CBC:  Recent Labs Lab 01/18/17 1604 01/19/17 0320 01/20/17 0558  WBC 6.2 5.0 7.3  NEUTROABS 5.2  --   --   HGB 10.1* 9.7* 9.9*  HCT 32.1* 31.6* 32.4*  MCV 65.3* 65.4* 67.0*   PLT 384 298 327      Lab Results  Component Value Date   HEPBSAG Negative 12/25/2016      Microbiology:  Recent Results (from the past 240 hour(s))  MRSA PCR Screening     Status: None   Collection Time: 01/18/17  8:05 PM  Result Value Ref Range Status   MRSA by PCR NEGATIVE NEGATIVE Final    Comment:        The GeneXpert MRSA Assay (FDA approved for NASAL specimens only), is one component of a comprehensive MRSA colonization surveillance program. It is not intended to diagnose MRSA infection nor to guide or monitor treatment for MRSA infections.   Body fluid culture     Status: None (Preliminary result)   Collection Time: 01/19/17 10:45 AM  Result Value Ref Range Status   Specimen Description PLEURAL  Final   Special Requests NONE  Final   Gram Stain   Final    RARE WBC PRESENT, PREDOMINANTLY MONONUCLEAR NO ORGANISMS SEEN    Culture   Final    NO GROWTH 2 DAYS Performed at [facility], [address], [place], [place] [address]    Report Status PENDING  Incomplete    Coagulation Studies: No results  for input(s): LABPROT, INR in the last 72 hours.  Urinalysis: No results for input(s): COLORURINE, LABSPEC, PHURINE, GLUCOSEU, HGBUR, BILIRUBINUR, KETONESUR, PROTEINUR, UROBILINOGEN, NITRITE, LEUKOCYTESUR in the last 72 hours.  Invalid input(s): APPERANCEUR    Imaging: No results found.   Medications:    . amLODipine  10 mg Oral Daily  . aspirin EC  81 mg Oral Daily  . carvedilol  25 mg Oral BID WC  . feeding supplement (NEPRO CARB STEADY)  237 mL Oral TID BM  . furosemide  80 mg Oral Daily  . gabapentin  400 mg Oral Daily  . heparin  5,000 Units Subcutaneous Q8H  . insulin aspart  0-15 Units Subcutaneous TID WC  . insulin aspart  0-5 Units Subcutaneous QHS  . lisinopril  40 mg Oral Daily  . mometasone-formoterol  2 puff Inhalation BID  . multivitamin  1 tablet Oral Daily  . sertraline  25 mg Oral Daily  . sevelamer carbonate  800 mg Oral  TID WC  . tiotropium  18 mcg Inhalation Daily   docusate sodium, guaiFENesin-codeine, hydrALAZINE, HYDROcodone-acetaminophen, hydrOXYzine, ipratropium-albuterol, morphine injection  Assessment/ Plan:  82 y.o. male with end stage renal disease on hemodialysis, hypertension, peripheral vascular disease, left AKA, MGUS, glaucoma, diabetes mellitus type II, congestive heart failure, blindness  MWF [facility] [place].   1. End Stage Renal Disease: on hemodialysis. MWF.  We will continue his routine dialysis If still in the hospital, he will be dialyzed tomorrow  2.  Anemia of chronic kidney disease Hemoglobin 9.9 continue low-dose Procrit  3.  Shortness of breath Feels better after thoracentesis.  2.2 L removed  4.  Secondary hyperparathyroidism Monitor phosphorus Continue sevelamer   LOS: 3 [name] 10/25/20181:47 PM  [facility] [place], [place]

## 2017-01-21 NOTE — Progress Notes (Signed)
Inpatient Diabetes Program Recommendations  AACE/ADA: New Consensus Statement on Inpatient Glycemic Control (2015)  Target Ranges:  Prepandial:   less than 140 mg/dL      Peak postprandial:   less than 180 mg/dL (1-2 hours)      Critically ill patients:  140 - 180 mg/dL   Lab Results  Component Value Date   GLUCAP 90 01/21/2017   HGBA1C 7.8 (H) 03/09/2016    Review of Glycemic Control  Inpatient Diabetes Program Recommendations:  Noted hypoglycemia post correction. Please consider decreasing Novolog correction to sensitive 0-9 units tid.  Thank you, Nani Gasser. Mayra Jolliffe, RN, MSN, CDE  Diabetes Coordinator Inpatient Glycemic Control Team Team Pager [phone] (8am-5pm) 01/21/2017 2:14 PM

## 2017-01-22 LAB — BODY FLUID CULTURE: Culture: NO GROWTH

## 2017-01-23 LAB — CHOLESTEROL, BODY FLUID: Cholesterol, Fluid: 27 mg/dL

## 2017-02-27 DEATH — deceased

## 2019-07-02 IMAGING — CR DG CHEST 2V
2 series · 2 of 2 positions shown · non-contrast
Comparison: March 09, 2016

CLINICAL DATA: Chest pain since last night

EXAM:
CHEST  2 VIEW

[chest lat]
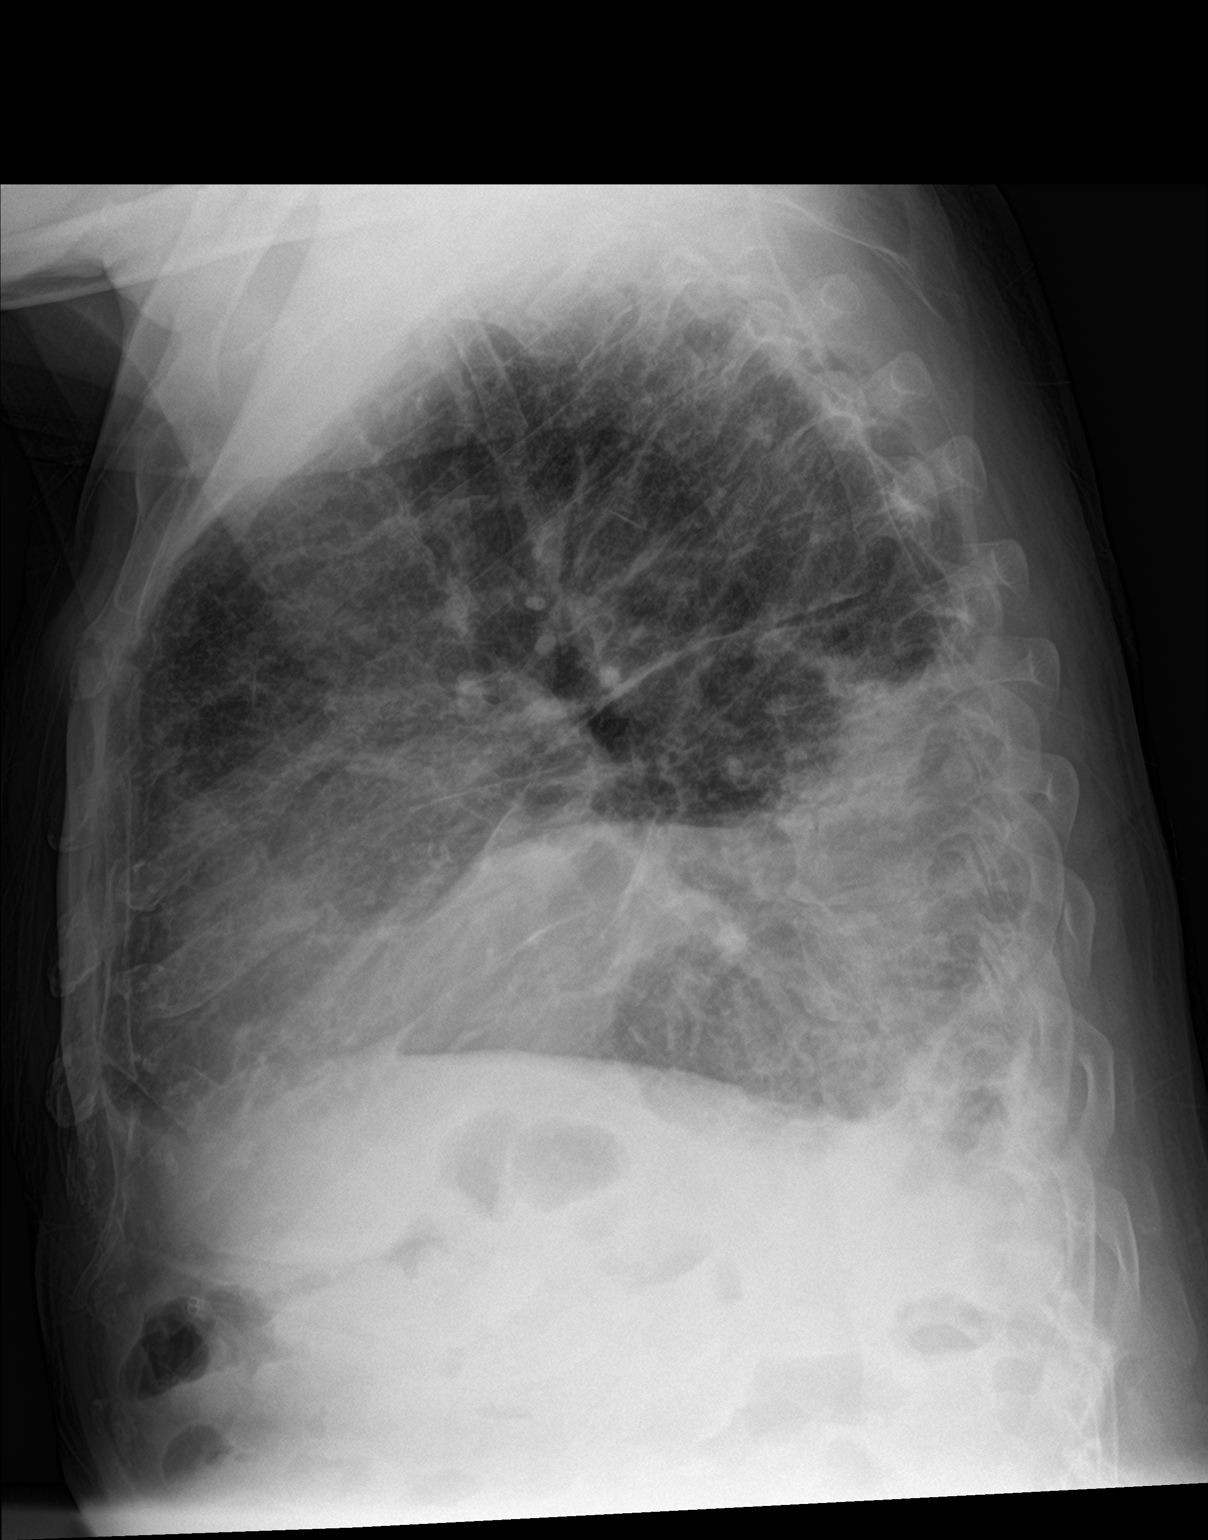

[chest ap]
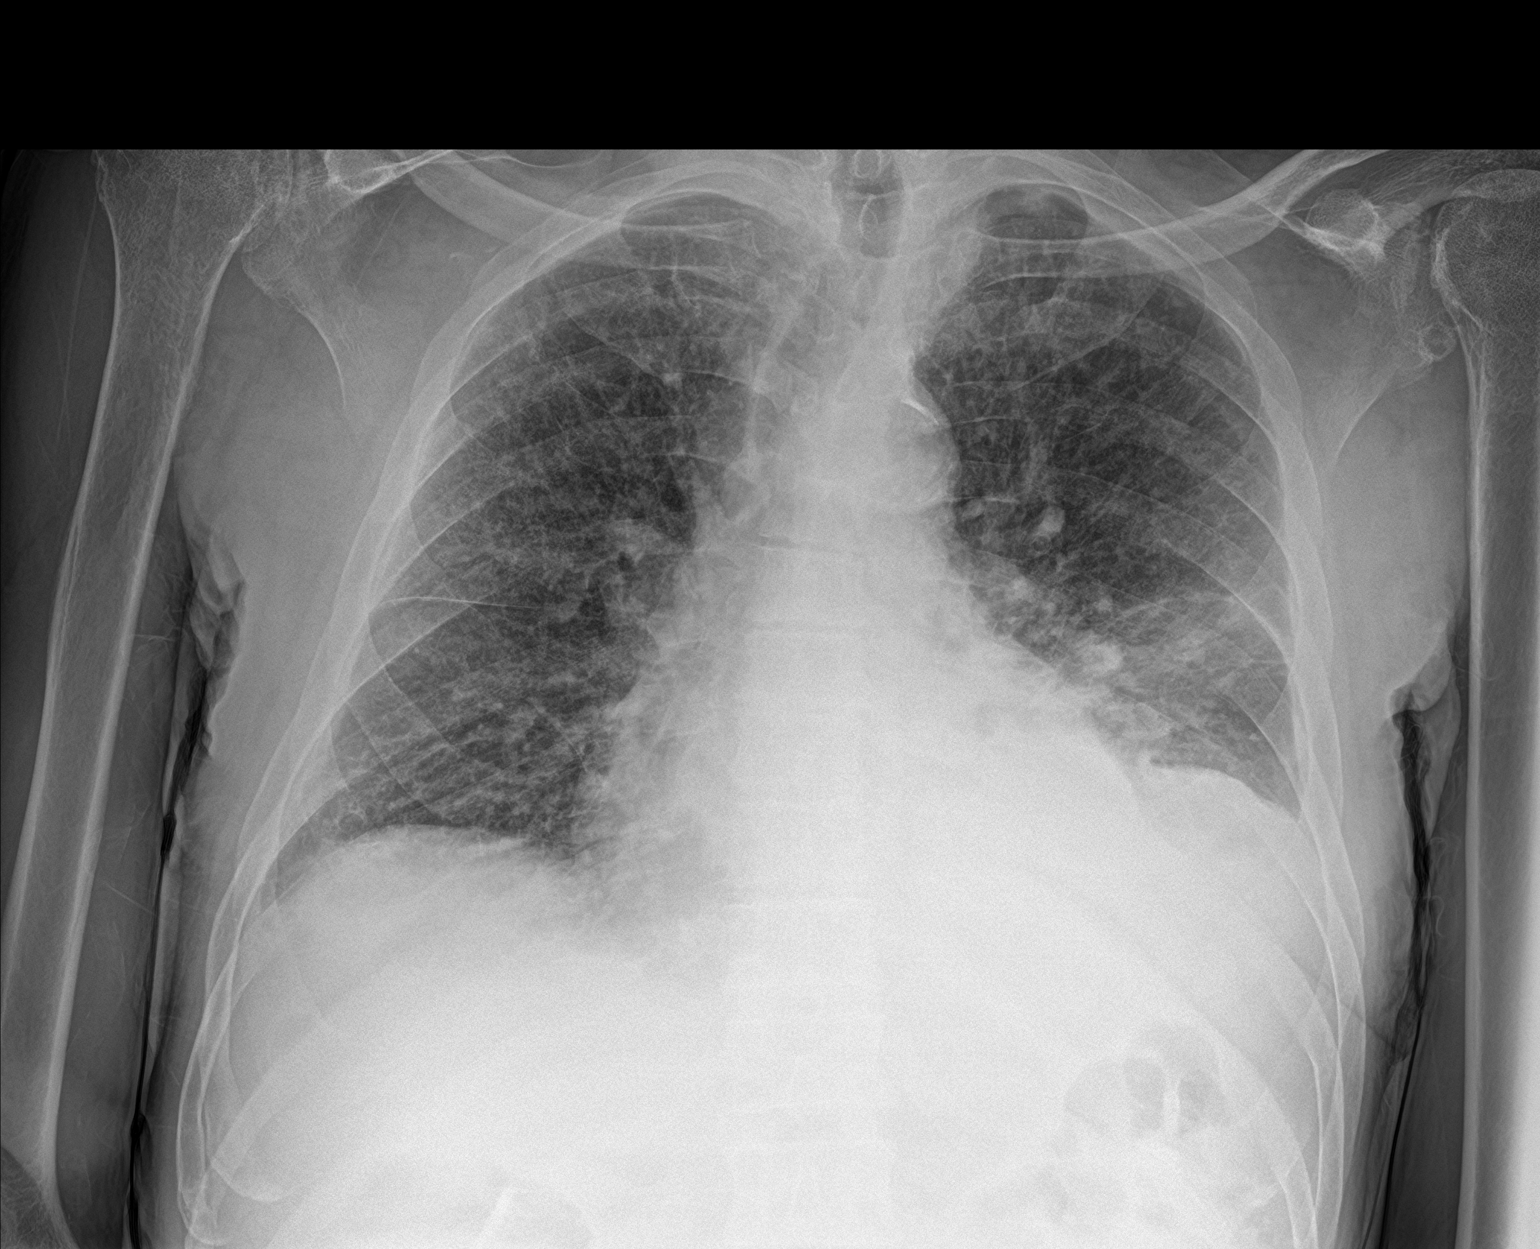

[2 of 2 positions shown; findings below may reference images not displayed]

FINDINGS: The mediastinal contour is normal. Heart size is enlarged. There is
increased pulmonary interstitium bilaterally. Patchy consolidation
of left lung base is noted. Bilateral pleural effusions are
identified. The bony structures are stable.
IMPRESSION: Congestive heart failure.

Patchy consolidation of left lung base with left pleural effusion,
underlying pneumonia is not excluded.
